# Patient Record
Sex: Female | Born: 1945 | Race: White | Hispanic: No | Marital: Married | Smoking: Never smoker
Health system: Southern US, Community
[De-identification: ages and names within clinical notes are randomized; demographics above are authoritative.]

## PROBLEM LIST (undated history)

## (undated) DIAGNOSIS — N289 Disorder of kidney and ureter, unspecified: Secondary | ICD-10-CM

## (undated) DIAGNOSIS — C4491 Basal cell carcinoma of skin, unspecified: Secondary | ICD-10-CM

## (undated) DIAGNOSIS — I10 Essential (primary) hypertension: Secondary | ICD-10-CM

## (undated) DIAGNOSIS — G8929 Other chronic pain: Secondary | ICD-10-CM

## (undated) DIAGNOSIS — R51 Headache: Secondary | ICD-10-CM

## (undated) DIAGNOSIS — M5136 Other intervertebral disc degeneration, lumbar region: Secondary | ICD-10-CM

## (undated) DIAGNOSIS — Z8719 Personal history of other diseases of the digestive system: Secondary | ICD-10-CM

## (undated) DIAGNOSIS — Z86010 Personal history of colonic polyps: Principal | ICD-10-CM

## (undated) DIAGNOSIS — C801 Malignant (primary) neoplasm, unspecified: Secondary | ICD-10-CM

## (undated) DIAGNOSIS — E785 Hyperlipidemia, unspecified: Secondary | ICD-10-CM

## (undated) DIAGNOSIS — K5790 Diverticulosis of intestine, part unspecified, without perforation or abscess without bleeding: Secondary | ICD-10-CM

## (undated) DIAGNOSIS — M51369 Other intervertebral disc degeneration, lumbar region without mention of lumbar back pain or lower extremity pain: Secondary | ICD-10-CM

## (undated) DIAGNOSIS — Z9889 Other specified postprocedural states: Secondary | ICD-10-CM

## (undated) DIAGNOSIS — K219 Gastro-esophageal reflux disease without esophagitis: Secondary | ICD-10-CM

## (undated) HISTORY — DX: Personal history of other diseases of the digestive system: Z87.19

## (undated) HISTORY — DX: Personal history of colonic polyps: Z86.010

## (undated) HISTORY — DX: Other intervertebral disc degeneration, lumbar region: M51.36

## (undated) HISTORY — PX: COLONOSCOPY: SHX174

## (undated) HISTORY — DX: Basal cell carcinoma of skin, unspecified: C44.91

## (undated) HISTORY — DX: Diverticulosis of intestine, part unspecified, without perforation or abscess without bleeding: K57.90

## (undated) HISTORY — DX: Other specified postprocedural states: Z98.890

## (undated) HISTORY — DX: Headache: R51

## (undated) HISTORY — DX: Other intervertebral disc degeneration, lumbar region without mention of lumbar back pain or lower extremity pain: M51.369

## (undated) HISTORY — DX: Hyperlipidemia, unspecified: E78.5

## (undated) HISTORY — DX: Gastro-esophageal reflux disease without esophagitis: K21.9

## (undated) HISTORY — DX: Malignant (primary) neoplasm, unspecified: C80.1

## (undated) HISTORY — PX: UPPER GASTROINTESTINAL ENDOSCOPY: SHX188

## (undated) HISTORY — PX: PORTACATH PLACEMENT: SHX2246

## (undated) HISTORY — DX: Other chronic pain: G89.29

---

## 1998-05-19 ENCOUNTER — Encounter: Payer: Self-pay | Admitting: Emergency Medicine

## 1998-05-19 ENCOUNTER — Emergency Department (HOSPITAL_COMMUNITY): Admission: EM | Admit: 1998-05-19 | Discharge: 1998-05-19 | Payer: Self-pay | Admitting: Emergency Medicine

## 1998-09-19 ENCOUNTER — Ambulatory Visit (HOSPITAL_COMMUNITY): Admission: RE | Admit: 1998-09-19 | Discharge: 1998-09-19 | Payer: Self-pay | Admitting: Family Medicine

## 1998-09-19 ENCOUNTER — Encounter: Payer: Self-pay | Admitting: Family Medicine

## 2000-05-28 ENCOUNTER — Encounter: Payer: Self-pay | Admitting: Emergency Medicine

## 2000-05-28 ENCOUNTER — Emergency Department (HOSPITAL_COMMUNITY): Admission: EM | Admit: 2000-05-28 | Discharge: 2000-05-28 | Payer: Self-pay | Admitting: Emergency Medicine

## 2000-07-20 ENCOUNTER — Emergency Department (HOSPITAL_COMMUNITY): Admission: EM | Admit: 2000-07-20 | Discharge: 2000-07-20 | Payer: Self-pay | Admitting: Emergency Medicine

## 2001-05-23 ENCOUNTER — Encounter: Payer: Self-pay | Admitting: Vascular Surgery

## 2001-05-28 ENCOUNTER — Ambulatory Visit (HOSPITAL_COMMUNITY): Admission: RE | Admit: 2001-05-28 | Discharge: 2001-05-28 | Payer: Self-pay | Admitting: Vascular Surgery

## 2001-05-28 ENCOUNTER — Ambulatory Visit (HOSPITAL_COMMUNITY): Admission: RE | Admit: 2001-05-28 | Discharge: 2001-05-28 | Payer: Self-pay | Admitting: *Deleted

## 2002-04-06 ENCOUNTER — Emergency Department (HOSPITAL_COMMUNITY): Admission: EM | Admit: 2002-04-06 | Discharge: 2002-04-06 | Payer: Self-pay

## 2002-05-23 ENCOUNTER — Inpatient Hospital Stay (HOSPITAL_COMMUNITY): Admission: EM | Admit: 2002-05-23 | Discharge: 2002-05-24 | Payer: Self-pay | Admitting: Emergency Medicine

## 2002-07-01 ENCOUNTER — Emergency Department (HOSPITAL_COMMUNITY): Admission: EM | Admit: 2002-07-01 | Discharge: 2002-07-01 | Payer: Self-pay | Admitting: Emergency Medicine

## 2002-08-24 ENCOUNTER — Ambulatory Visit (HOSPITAL_COMMUNITY): Admission: RE | Admit: 2002-08-24 | Discharge: 2002-08-24 | Payer: Self-pay | Admitting: General Surgery

## 2003-03-11 ENCOUNTER — Encounter: Payer: Self-pay | Admitting: Emergency Medicine

## 2003-03-11 ENCOUNTER — Emergency Department (HOSPITAL_COMMUNITY): Admission: EM | Admit: 2003-03-11 | Discharge: 2003-03-12 | Payer: Self-pay | Admitting: Emergency Medicine

## 2003-11-05 ENCOUNTER — Ambulatory Visit (HOSPITAL_COMMUNITY): Admission: RE | Admit: 2003-11-05 | Discharge: 2003-11-05 | Payer: Self-pay | Admitting: General Surgery

## 2004-08-02 ENCOUNTER — Emergency Department (HOSPITAL_COMMUNITY): Admission: EM | Admit: 2004-08-02 | Discharge: 2004-08-02 | Payer: Self-pay | Admitting: Emergency Medicine

## 2005-04-06 ENCOUNTER — Ambulatory Visit: Payer: Self-pay | Admitting: Internal Medicine

## 2005-04-23 ENCOUNTER — Ambulatory Visit: Payer: Self-pay | Admitting: Internal Medicine

## 2005-08-05 ENCOUNTER — Encounter: Admission: RE | Admit: 2005-08-05 | Discharge: 2005-08-05 | Payer: Self-pay | Admitting: Neurology

## 2005-09-18 ENCOUNTER — Ambulatory Visit: Payer: Self-pay | Admitting: Internal Medicine

## 2005-11-09 ENCOUNTER — Ambulatory Visit: Payer: Self-pay | Admitting: Internal Medicine

## 2006-02-19 ENCOUNTER — Ambulatory Visit: Payer: Self-pay | Admitting: Internal Medicine

## 2006-02-20 ENCOUNTER — Encounter (INDEPENDENT_AMBULATORY_CARE_PROVIDER_SITE_OTHER): Payer: Self-pay | Admitting: Specialist

## 2006-02-20 ENCOUNTER — Ambulatory Visit (HOSPITAL_COMMUNITY): Admission: RE | Admit: 2006-02-20 | Discharge: 2006-02-20 | Payer: Self-pay | Admitting: Internal Medicine

## 2006-04-02 ENCOUNTER — Ambulatory Visit: Payer: Self-pay | Admitting: Internal Medicine

## 2006-04-09 ENCOUNTER — Inpatient Hospital Stay (HOSPITAL_COMMUNITY): Admission: EM | Admit: 2006-04-09 | Discharge: 2006-04-20 | Payer: Self-pay | Admitting: Emergency Medicine

## 2006-04-11 ENCOUNTER — Encounter (INDEPENDENT_AMBULATORY_CARE_PROVIDER_SITE_OTHER): Payer: Self-pay | Admitting: *Deleted

## 2006-04-12 ENCOUNTER — Ambulatory Visit: Payer: Self-pay | Admitting: Gastroenterology

## 2006-05-03 ENCOUNTER — Encounter (HOSPITAL_COMMUNITY): Admission: RE | Admit: 2006-05-03 | Discharge: 2006-08-01 | Payer: Self-pay | Admitting: Nephrology

## 2006-05-14 ENCOUNTER — Ambulatory Visit: Payer: Self-pay | Admitting: Internal Medicine

## 2008-07-30 ENCOUNTER — Emergency Department (HOSPITAL_COMMUNITY): Admission: EM | Admit: 2008-07-30 | Discharge: 2008-07-31 | Payer: Self-pay | Admitting: Emergency Medicine

## 2008-12-09 ENCOUNTER — Emergency Department (HOSPITAL_COMMUNITY): Admission: EM | Admit: 2008-12-09 | Discharge: 2008-12-09 | Payer: Self-pay | Admitting: Emergency Medicine

## 2010-01-17 ENCOUNTER — Ambulatory Visit: Payer: Self-pay | Admitting: Family Medicine

## 2010-01-17 DIAGNOSIS — S99919A Unspecified injury of unspecified ankle, initial encounter: Secondary | ICD-10-CM

## 2010-01-17 DIAGNOSIS — I10 Essential (primary) hypertension: Secondary | ICD-10-CM | POA: Insufficient documentation

## 2010-01-17 DIAGNOSIS — S99929A Unspecified injury of unspecified foot, initial encounter: Secondary | ICD-10-CM | POA: Insufficient documentation

## 2010-01-17 DIAGNOSIS — H6500 Acute serous otitis media, unspecified ear: Secondary | ICD-10-CM

## 2010-01-17 DIAGNOSIS — M109 Gout, unspecified: Secondary | ICD-10-CM | POA: Insufficient documentation

## 2010-01-17 DIAGNOSIS — E785 Hyperlipidemia, unspecified: Secondary | ICD-10-CM | POA: Insufficient documentation

## 2010-01-17 DIAGNOSIS — H66019 Acute suppurative otitis media with spontaneous rupture of ear drum, unspecified ear: Secondary | ICD-10-CM | POA: Insufficient documentation

## 2010-01-17 DIAGNOSIS — S8990XA Unspecified injury of unspecified lower leg, initial encounter: Secondary | ICD-10-CM

## 2010-03-03 ENCOUNTER — Emergency Department (HOSPITAL_COMMUNITY): Admission: EM | Admit: 2010-03-03 | Discharge: 2010-03-04 | Payer: Self-pay | Admitting: Emergency Medicine

## 2010-03-04 ENCOUNTER — Ambulatory Visit (HOSPITAL_COMMUNITY): Admission: RE | Admit: 2010-03-04 | Discharge: 2010-03-04 | Payer: Self-pay | Admitting: Emergency Medicine

## 2010-07-21 ENCOUNTER — Encounter: Payer: Self-pay | Admitting: Family Medicine

## 2010-07-21 ENCOUNTER — Ambulatory Visit
Admission: RE | Admit: 2010-07-21 | Discharge: 2010-07-21 | Payer: Self-pay | Source: Home / Self Care | Attending: Family Medicine | Admitting: Family Medicine

## 2010-07-21 DIAGNOSIS — Z940 Kidney transplant status: Secondary | ICD-10-CM | POA: Insufficient documentation

## 2010-08-17 NOTE — Assessment & Plan Note (Signed)
Summary: SINUS INFECTION/EVM   Vital Signs:  Patient profile:   65 year old female Height:      64.5 inches Weight:      205 pounds O2 Sat:      97 % on Room air Temp:     97.2 degrees F oral Pulse rate:   112 / minute Pulse rhythm:   regular Resp:     18 per minute  Vitals Entered By: Levonne Spiller EMT-P (July 21, 2010 3:05 PM)  O2 Flow:  Room air CC: Bilateral face pain and headache Is Patient Diabetic? No Pain Assessment Patient in pain? yes     Location: head Intensity: 8 Type: aching Onset of pain  Gradual  Does patient need assistance? Functional Status Self care Ambulation Normal   Chief Complaint:  Bilateral face pain and headache.  History of Present Illness: The patient is presenting today and reporting that for the last 2-3 weeks she has been having worsening sinus pain and pressure and postnasal drainage.  She is having maxillary sinus pain.  She reports that she has been feeling lightheaded at times and has been having migraine headaches.  She has been sneezing and coughing.  She says that she has left ear pain and pressure as well.  She says that her nose has been running and producing a clear nasal discharge.  She reports that she has not fallen down.  She has been taking her BP medications daily.  She reports that her BP meds had been changed recently in November 2011 by her nephrologist (pt says she has had a kidney transplant). She was taken off Amlodipine 10 mg and placed on lisinopril/HCTZ 10/12.5. She is scheduled to see her nephrologist next week in 5 days.  She denies CP, SOB, falls, visual changes.  She says that the edema in the extremities has improved overall since getting off the amlodipine.    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 Kidney Transplant 07/27/2003  Family History: No history of kidney disease reported  Social History: No Tobacco, No ETOH, no Rec Drugs Pt doesn't have an automobile.    Review of Systems General:  Denies chills, fatigue, fever, loss of appetite, malaise, sleep disorder, sweats, weakness, and weight loss. Eyes:  Denies blurring, discharge, double vision, eye irritation, eye pain, halos, itching, light sensitivity, red eye, vision loss-1 eye, and vision loss-both eyes. ENT:  Complains of decreased hearing, earache, nasal congestion, sinus pressure, and sore throat. CV:  Denies bluish discoloration of lips or nails, chest pain or discomfort, difficulty breathing at night, difficulty breathing while lying down, fainting, fatigue, leg cramps with exertion, lightheadness, near fainting, palpitations, shortness of breath with exertion, swelling of feet, swelling of hands, and weight gain. Resp:  Denies chest discomfort, chest pain with inspiration, cough, coughing up blood, excessive snoring, hypersomnolence, morning headaches, pleuritic, shortness of breath, sputum productive, and wheezing. GI:  Denies abdominal pain, bloody stools, change in bowel habits, constipation, dark tarry stools, diarrhea, excessive appetite, gas, hemorrhoids, indigestion, loss of appetite, nausea, vomiting, vomiting blood, and yellowish skin color. GU:  Denies abnormal vaginal bleeding, decreased libido, discharge, dysuria, genital sores, hematuria, incontinence, nocturia, urinary frequency, and urinary hesitancy. MS:  Denies joint pain, joint redness, joint swelling, loss of strength, low back pain, mid back pain,  muscle aches, muscle , cramps, muscle weakness, stiffness, and thoracic pain. Derm:  Denies changes in color of skin, changes in nail beds, dryness, excessive perspiration, flushing, hair loss, insect bite(s), itching, lesion(s), poor wound healing, and rash. Neuro:   Complains of headaches; denies brief paralysis, difficulty with concentration, disturbances in coordination, falling down, inability to speak, memory loss, numbness, poor balance, seizures, sensation of room spinning, tingling, tremors, visual disturbances, and weakness. Psych:  Denies alternate hallucination ( auditory/visual), anxiety, depression, easily angered, easily tearful, irritability, mental problems, panic attacks, sense of great danger, suicidal thoughts/plans, thoughts of violence, unusual visions or sounds, and thoughts /plans of harming others; Problems Sleeping. Endo:  Denies cold intolerance, excessive hunger, excessive thirst, excessive urination, heat intolerance, polyuria, and weight change. Heme:  Complains of abnormal bruising; denies bleeding, enlarge lymph nodes, fevers, pallor, and skin discoloration. Allergy:  Denies hives or rash, itching eyes, persistent infections, seasonal allergies, and sneezing.  Physical Exam  General:  Well-developed,well-nourished,in no acute distress; alert,appropriate and cooperative throughout examination Head:  Normocephalic and atraumatic without obvious abnormalities. No apparent alopecia or balding. Eyes:  No corneal or conjunctival inflammation noted. EOMI. Perrla. Funduscopic exam benign, without hemorrhages, exudates or papilledema. Vision grossly normal. Ears:  External ear exam shows no significant lesions or deformities.  Otoscopic examination reveals clear canals, tympanic membranes are intact bilaterally without bulging, retraction, inflammation or discharge. Hearing is grossly normal bilaterally. Nose:  bilateral swollen nasal turbinates with yellow crusting seen in the right nares.  partially deviated septum seen. No external edema seen.  Mouth:  Edentulous, dry mucous membranes, tongue dry.  Neck:  No deformities, masses, or tenderness noted. Lungs:  Normal respiratory effort, chest expands symmetrically. Lungs are clear to  auscultation, no crackles or wheezes. Heart:  Normal rate and regular rhythm. S1 and S2 normal without gallop, murmur, click, rub or other extra sounds. Pulses:  weak, thready pulses noted, therefore EKG was ordered...see in chart.  Extremities:   left pretibial edema and right pretibial edema.   Skin:  thin skin noted, some minor ecchymoses seen Psych:  Cognition and judgment appear intact. Alert and cooperative with normal attention span and concentration. No apparent delusions, illusions, hallucinations   Impression & Recommendations:  Problem # 1:  HYPOTENSION (ICD-458.9) Assessment New  The patient was given some oral fluids in the office and instructed to stop taking the blood pressure medications until she can be evaluated and reassessed by her nephrologist.  The patient was advised to get a home BP monitor and monitor her BP at least 2-3 times per day. I also told her daughter.  I reviewed FALL precautions with the patient and made sure that she understood that she was at high risk for falling.  I asked her to drink extra clear fluids as well. The patient verbalized clear understanding.   If the patient starts to get mental status changes, becomes unsteady on her feet or any acute changes, I advised her to go to the ER.  The patient verbalized clear understanding.    Problem # 2:  ACUTE SINUSITIS, UNSPECIFIED (ICD-461.9)  Her updated medication list for this problem includes:    Amoxicillin 500 Mg Caps (Amoxicillin) .Marland Kitchen... Take 1 by mouth two times a day until completed    Fluticasone Propionate 50 Mcg/act Susp (Fluticasone propionate) .Marland Kitchen... 2 sprays per nostril once daily  Problem # 3:  ALLERGIC RHINITIS CAUSE UNSPECIFIED (ICD-477.9)  Her updated medication list for this problem includes:    Fluticasone Propionate 50 Mcg/act  Susp (Fluticasone propionate) .Marland Kitchen... 2 sprays per nostril once daily    Loratadine 10 Mg Tabs (Loratadine) .Marland Kitchen... Take 1 by mouth daily as needed for  allergies  Problem # 4:  KIDNEY TRANSPLANTATION, HX OF (ICD-V42.0) The patient was advised to make sure and see her nephrologist in 5 days as scheduled.  The patient verbalized clear understanding.    Complete Medication List: 1)  Magnesium Oxide 400 Mg Tabs (Magnesium oxide) 2)  Phospha 250 Neutral [phone] Mg Tabs (K phos mono-sod phos di & mono) 3)  Amlodipine Besylate 10 Mg Tabs (Amlodipine besylate) 4)  Nexium 40 Mg Pack (Esomeprazole magnesium) 5)  Prograf 0.5 Mg Caps (Tacrolimus) 6)  Lipitor 40 Mg Tabs (Atorvastatin calcium) 7)  Lexapro 20 Mg Tabs (Escitalopram oxalate) 8)  Myfortic 360 Mg Tbec (Mycophenolate sodium) 9)  Sumatriptan Succinate 100 Mg Tabs (Sumatriptan succinate) 10)  Cortisporin 3.5-10000-1 Soln (Neomycin-polymyxin-hc) .Marland Kitchen.. 1 drop right ear 4x/day 11)  Tylenol With Codeine #3 300-30 Mg Tabs (Acetaminophen-codeine) .Marland Kitchen.. 1 by mouth q6-8 hours as needed pain 12)  R Knee Brace  .... Dx: pain and inflammation r knee 13)  Lisinopril-hctz 10mg - 12.5mg   .... 1 per day 14)  Amoxicillin 500 Mg Caps (Amoxicillin) .... Take 1 by mouth two times a day until completed 15)  Fluticasone Propionate 50 Mcg/act Susp (Fluticasone propionate) .... 2 sprays per nostril once daily 16)  Loratadine 10 Mg Tabs (Loratadine) .... Take 1 by mouth daily as needed for allergies 17)  Abreva 10 % Crea (Docosanol) .... Apply 5x/day to cold sores until healed:  max use x 10 days  Patient Instructions: 1)  Stop Taking Your blood pressure medications Now until you can be seen by your kidney specialist in 5 days.  Your blood pressure is too low. 2)  Get a home BP monitor and start checking your blood pressure 2 to 3 times per day.  3)  Check your Blood Pressure regularly. If it is below: 90/70 you should make an appointment. 4)  Please be sure to see your nephrologist in 5 days as scheduled. 5)  Oral Rehydration Solution: drink 1/2 ounce every 15 minutes. If tolerated afert 1 hour, drink 1 ounce  every 15 minutes. As you can tolerate, keep adding 1/2 ounce every 15 minutes, up to a total of 2-4 ounces. Contact the office if unable to tolerate oral solution, if you keep vomiting, or you continue to have signs of dehydration. 6)  Take your antibiotic as prescribed until ALL of it is gone, but stop if you develop a rash or swelling and contact our office as soon as possible. 7)  Acute sinusitis symptoms for less than 10 days are not helped by antibiotics.Use warm moist compresses, and over the counter decongestants ( only as directed). Call if no improvement in 5-7 days, sooner if increasing pain, fever, or new symptoms. 8)  The patient was informed that there is no on-call provider or services available at this clinic during off-hours (when the clinic is closed).  If the patient developed a problem or concern that required immediate attention, the patient was advised to go the the nearest available urgent care or emergency department for medical care.  The patient verbalized understanding.    9)  Take extra special precautions to avoid falling down.  You are at HIGH RISK for falling down.  Please be careful and make sure you are holding on to something at all times when you are getting up and moving around.  Prescriptions: ABREVA  10 % CREA (DOCOSANOL) apply 5x/day to cold sores until healed:  Max use x 10 days  #1 x 0   Entered and Authorized by:   Standley Dakins MD   Signed by:   Standley Dakins MD on 07/21/2010   Method used:   Electronically to        [facility] [id number] (retail)       [address]       [place], [place]  [address]       Ph: [phone] or [phone]       Fax: [phone]   RxID:   [id number] LORATADINE 10 MG TABS (LORATADINE) take 1 by mouth daily as needed for allergies  #20 x 0   Entered and Authorized by:   Standley Dakins MD   Signed by:   Standley Dakins MD on 07/21/2010   Method used:   Elect[facility]ically to        [facility]  [id number] (retail)       [address]       [place], [place]  [address]       Ph: [phone] or [phone]       Fax: [phone]   RxID:   [id number] FLUTICASONE PROPIONATE 50 MCG/ACT SUSP (FLUTICASONE PROPIONATE) 2 sprays per nostril once daily  #1 x 0   Entered and Authorized by:   Standley Dakins MD   Signed by:   Standley Dakins MD on 07/21/2010   Method used:   [facility]        [facility] [id number] ([facility]       [address]       [place], [place]  [address]       Ph: [phone] or [phone]       Fax: [phone]   RxID:   [id number] AMOXICILLIN 500 MG CAPS (AMOXICILLIN) take 1 by mouth two times a day until completed  #20 x 0   Entered and Authorized by:   Standley Dakins MD   Signed by:   Standley Dakins MD on 07/21/2010   Method used:   [facility]        [facility] [id number] ([facility]ail[facility]       [address]       [place], [place]  [address]       Ph: [phone] or [phone]       Fax: [phone]   RxID:   [id number]     [facility] ECG  ID: [id number] Patient: Erica Woodward, Erica Woodward DOB: 02/14/1946 Age: 65 Years Old Sex: Female Race: White Physician: Laural Benes Height: 64.5 Weight: 205 Indication for test: hypotension, thready pulse Status: Confirmed Past Medical History:  Migraines Gout Hyperlipidemia Hypertension   Interpretation:  sinus tachycardia Nonspecific ST-T wave abnormalites

## 2010-08-17 NOTE — Assessment & Plan Note (Signed)
Summary: EAR DRAINAGE/JBB   Vital Signs:  Patient Profile:   65 Years Old Female CC:      Bilateral Ear Ache. / rwt Height:     64.5 inches Weight:      192 pounds BMI:     32.57 O2 Sat:      97 % O2 treatment:    Room Air Temp:     97.0 degrees F oral Pulse rate:   94 / minute Pulse rhythm:   regular Resp:     20 per minute BP sitting:   126 / 72  (left arm)  Pt. in pain?   yes    Location:   head    Intensity:   1    Type:       aching  Vitals Entered By: Levonne Spiller EMT-P (January 17, 2010 11:43 AM)              Is Patient Diabetic? No      Current Allergies: No known allergies History of Present Illness History from: patient Reason for visit: see chief complaint Chief Complaint: Bilateral Ear Ache. / rwt History of Present Illness: Both ears have been bothering her for about 1 month. Bilateral hearing loss worse on the right. History of frequent ear infections as a child and as an adult by her report. No f/c. + drainage - occ bloody and sharp pains in right ear. + congestion, runny nose, clear and no dizziness.   Fell on Sunday and has pain in her right knee. Worse with walking and weight bearing. Started to hurt in the back of the knee yesterday.  Also with swelling.   Dr. Lowell Guitar - Nephrologist - usually about every 3 months. S/p kidney transplant 07/27/03. She does not have a PCP.       Past History:  Past Medical History: Migraines Gout Hyperlipidemia Hypertension  Past Surgical History: Kidney Transplant 07/27/2003 Physical Exam General appearance: well developed, well nourished, no acute distress Ears: normal left TM - R TM with erythema and small upper perforation Neck: neck supple,  trachea midline, no masses, nontender lymphadenopathy Chest/Lungs: no rales, wheezes, or rhonchi bilateral, breath sounds equal without effort Heart: regular rate and  rhythm, no murmur Extremities: Bilateral knee crepitus, R knee with ecchymosis laterally, tender at  tibial plateau. + fullness behind the knee and subpatella bursa.  Skin: no obvious rashes or lesions MSE: oriented to time, place, and person Assessment New Problems: OTITIS MEDIA, ACUTE WITH RUPTURE OF TYMPANIC MEMBRANE (ICD-382.01) HYPERTENSION (ICD-401.9) HYPERLIPIDEMIA (ICD-272.4) GOUT (ICD-274.9) KNEE INJURY, RIGHT (ICD-959.7) ACUTE SEROUS OTITIS MEDIA (ICD-381.01)   Plan New Medications/Changes: R KNEE BRACE Dx: pain and inflammation R knee  #1 x 0, 01/17/2010, Tacey Ruiz MD TYLENOL WITH CODEINE #3 300-30 MG TABS (ACETAMINOPHEN-CODEINE) 1 by mouth Q6-8 hours as needed pain  #20 x 0, 01/17/2010, Tacey Ruiz MD CORTISPORIN 3.5-10000-1 SOLN [facility]) 1 drop right ear 4x/day  #1 btl x 0, 01/17/2010, Tacey Ruiz MD  New Orders: New Patient Level III [id number] Planning Comments:   She was encouraged to keep the ear dry and to use the medication as prescribed. She was given the number of an audiologist for further evaluation.  She was told to elevate the right knee and wear the ace bandage as directed. If she continues to have no improvement in pain/swelling she should call/return for possible xrays. We stayed away from NSAIDs which have better anti-inflammatory properties given her h/o kidney transplantation. If no improvement will discuss this  with her nephrologist. this was discussed with patient and she agreed.   The patient and/or caregiver has been counseled thoroughly with regard to medications prescribed including dosage, schedule, interactions, rationale for use, and possible side effects and they verbalize understanding.  Diagnoses and expected course of recovery discussed and will return if not improved as expected or if the condition worsens. Patient and/or caregiver verbalized understanding.  Prescriptions: R KNEE BRACE Dx: pain and inflammation R knee  #1 x 0   Entered and Authorized by:   Tacey Ruiz MD   Signed by:   Tacey Ruiz MD on 01/17/2010   Method used:    Print then Give to Patient   RxID:   [id number] TYLENOL WITH CODEINE #3 300-30 MG TABS (ACETAMINOPHEN-CODEINE) 1 by mouth Q6-8 hours as needed pain  #20 x 0   Entered and Authorized by:   Tacey Ruiz MD   Signed by:   Tacey Ruiz MD on 01/17/2010   Method used:   Print then Give to Patient   RxID:   [id number] CORTISPORIN 3.5-10000-1 SOLN (NEOMYCIN-POLYMYXIN-HC) 1 drop right ear 4x/day  #1 btl x 0   Entered and Authorized by:   Tacey Ruiz MD   Signed by:   Tacey Ruiz MD on 01/17/2010   Method used:   Electronically to        [facility]  [address] (retail)       [address], [address]       [place], [place]  [address]       Ph: [phone]       Fax: [phone]   RxID:   [id number]   Patient Instructions: 1)  You may move around but avoid painful motions. Apply ice to sore area for 20 minutes 3-4 times a day for 2-3 days. 2)  Wear ace bandage to help with inflammation. 3)  Please make an appointment with an audiologist (ear doctor) for your ear pain.   Orders Added: 1)  New Patient Level III [99203]  The patient has been informed that he/she needs to obtain a primary care physician for completeness and better continuity of care. We are a conveinent care facility and not a primary care office.  The risks, benefits and possible side effects of the treatments and tests were explained clearly to the patient and the patient verbalized understanding.  The patient was informed that there is no on-call provider or services available at this clinic during off-hours (when the clinic is closed).  If the patient developed a problem or concern that required immediate attention, the patient was advised to go the the nearest available urgent care or emergency department for medical care.  The patient verbalized understanding.

## 2010-09-29 LAB — PROTIME-INR
INR: 0.91 (ref 0.00–1.49)
Prothrombin Time: 12.5 seconds (ref 11.6–15.2)

## 2010-09-29 LAB — DIFFERENTIAL
Basophils Absolute: 0 10*3/uL (ref 0.0–0.1)
Eosinophils Absolute: 0.1 10*3/uL (ref 0.0–0.7)
Monocytes Absolute: 0.6 10*3/uL (ref 0.1–1.0)
Neutrophils Relative %: 52 % (ref 43–77)

## 2010-09-29 LAB — BASIC METABOLIC PANEL
CO2: 25 mEq/L (ref 19–32)
Chloride: 108 mEq/L (ref 96–112)
Potassium: 3.6 mEq/L (ref 3.5–5.1)
Sodium: 138 mEq/L (ref 135–145)

## 2010-09-29 LAB — CBC
Hemoglobin: 12.1 g/dL (ref 12.0–15.0)
MCH: 30 pg (ref 26.0–34.0)
RBC: 4.04 MIL/uL (ref 3.87–5.11)
RDW: 14.1 % (ref 11.5–15.5)

## 2010-09-29 LAB — APTT: aPTT: 28 seconds (ref 24–37)

## 2010-10-24 LAB — DIFFERENTIAL
Basophils Absolute: 0 10*3/uL (ref 0.0–0.1)
Eosinophils Relative: 1 % (ref 0–5)
Lymphs Abs: 1.9 10*3/uL (ref 0.7–4.0)
Monocytes Relative: 10 % (ref 3–12)
Neutrophils Relative %: 56 % (ref 43–77)

## 2010-10-24 LAB — URINALYSIS, ROUTINE W REFLEX MICROSCOPIC
Glucose, UA: NEGATIVE mg/dL
Hgb urine dipstick: NEGATIVE
Specific Gravity, Urine: 1.024 (ref 1.005–1.030)

## 2010-10-24 LAB — URINE MICROSCOPIC-ADD ON

## 2010-10-24 LAB — BASIC METABOLIC PANEL
BUN: 13 mg/dL (ref 6–23)
CO2: 24 mEq/L (ref 19–32)
Chloride: 109 mEq/L (ref 96–112)
Potassium: 3.5 mEq/L (ref 3.5–5.1)

## 2010-10-24 LAB — CBC
Hemoglobin: 13.1 g/dL (ref 12.0–15.0)
MCHC: 33.8 g/dL (ref 30.0–36.0)
Platelets: 220 10*3/uL (ref 150–400)

## 2010-10-24 LAB — URINE CULTURE

## 2010-10-30 LAB — DIFFERENTIAL
Basophils Absolute: 0 10*3/uL (ref 0.0–0.1)
Basophils Relative: 0 % (ref 0–1)
Eosinophils Absolute: 0 10*3/uL (ref 0.0–0.7)
Eosinophils Relative: 0 % (ref 0–5)
Lymphocytes Relative: 18 % (ref 12–46)
Lymphs Abs: 1.5 K/uL (ref 0.7–4.0)
Monocytes Absolute: 0.4 K/uL (ref 0.1–1.0)
Monocytes Relative: 5 % (ref 3–12)
Neutro Abs: 6.4 10*3/uL (ref 1.7–7.7)
Neutrophils Relative %: 77 % (ref 43–77)

## 2010-10-30 LAB — BASIC METABOLIC PANEL
BUN: 8 mg/dL (ref 6–23)
CO2: 24 mEq/L (ref 19–32)
Calcium: 8.6 mg/dL (ref 8.4–10.5)
Chloride: 107 mEq/L (ref 96–112)
Creatinine, Ser: 0.82 mg/dL (ref 0.4–1.2)

## 2010-10-30 LAB — CBC
HCT: 38.2 % (ref 36.0–46.0)
Hemoglobin: 12.5 g/dL (ref 12.0–15.0)
MCHC: 32.6 g/dL (ref 30.0–36.0)
MCV: 92.9 fL (ref 78.0–100.0)
Platelets: 225 10*3/uL (ref 150–400)
RBC: 4.12 MIL/uL (ref 3.87–5.11)
RDW: 13.3 % (ref 11.5–15.5)
WBC: 8.3 K/uL (ref 4.0–10.5)

## 2010-10-30 LAB — URINALYSIS, ROUTINE W REFLEX MICROSCOPIC
Bilirubin Urine: NEGATIVE
Glucose, UA: NEGATIVE mg/dL
Hgb urine dipstick: NEGATIVE
Ketones, ur: NEGATIVE mg/dL
Nitrite: NEGATIVE
Protein, ur: NEGATIVE mg/dL
Specific Gravity, Urine: 1.024 (ref 1.005–1.030)
Urobilinogen, UA: 1 mg/dL (ref 0.0–1.0)
pH: 7 (ref 5.0–8.0)

## 2010-10-30 LAB — URINE MICROSCOPIC-ADD ON

## 2010-10-30 LAB — BASIC METABOLIC PANEL WITH GFR
GFR calc Af Amer: >60 mL/min (ref >60)
GFR calc non Af Amer: >60 mL/min (ref >60)
Glucose, Bld: 110 mg/dL — ABNORMAL HIGH (ref 70–99)
Potassium: 4 meq/L (ref 3.5–5.1)
Sodium: 139 meq/L (ref 135–145)

## 2010-12-01 NOTE — H&P (Signed)
NAMETALER, KUSHNER              ACCOUNT NO.:  [id number]   MEDICAL RECORD NO.:  [id number]          PATIENT TYPE:  INP   LOCATION:  5730                         FACILITY:  [facility]   PHYSICIAN:  Malcolm T. Russella Dar, MD, FACGDATE OF BIRTH:  1945-12-21   DATE OF ADMISSION:  04/09/2006  DATE OF DISCHARGE:                                HISTORY & PHYSICAL   RENAL DOCTOR:  Dr. Casimiro Needle.   CHIEF COMPLAINT:  Chronic diarrhea and new abdominal discomfort, weakness.   HISTORY OF PRESENT ILLNESS:  Mrs. Pieczynski is a 65 year old white female.  She is status post renal transplant a couple of years ago.  She has been  having diarrhea for about 4 months; this is watery and not bloody.  She  underwent a colonoscopy as well as an endoscopy on August8, the day after  she was seen in the office by Dr. Leone Payor.  The colonoscopy was essentially  unremarkable and random biopsies of the colon revealed normal pathology with  no evidence for any microscopic or collagenous colitis or dysplasia.  On the  EGD, she did have some gastritis and there were a few atypical cells, but a  reading of this by pathologist at [facility] as well as Dr.  Luisa Hart here in [place] thought that it was chronic gastritis.  Dr.  Leone Payor had planned to repeat the EGD in a few months in order to recheck on  the gastritis after prolonged treatment with proton pump inhibitors and to  make sure that there was no lymphoma or any type of chronic infection.  On  the return office visit on September18 with Dr. Leone Payor, the diarrhea had  improved with the use of Imodium.  However, for the last several days, she  says that she really has not had any improvement in her diarrhea; she had  maybe 10 watery stools yesterday without evidence of blood.  Stools are  anywhere from a small amount to a moderate amount.  She had several episodes  overnight and has had 2 today.  Over the last couple of days, she has had  pain in her  lower abdomen which is not severe, but is more of a discomfort  and cramping.  She continues to feel weak and fatigued very easily.  She has  not had any syncope.  She called the office today complaining of these  symptoms and was advised to come to the emergency room.  In the emergency  room, her blood pressure is low and we are going to be checking some  orthostatic blood pressures, but the plan is to admit her for stool studies  and further evaluation, as well as symptomatic treatment of her symptoms.  She does say that she has lost about 30 pounds over the last several months.  Any time she eats, the food runs right through her.  She is somewhat  nauseated, but does not vomit.  She denies the use of any over-the-counter  medications.  She does take magnesium oxide daily and trialed not using this  for several days and it made no difference in the  frequency of or  consistency of her stools.   ALLERGIES:  None known.   CURRENT MEDICATIONS:  1. Imitrex 100 mg as needed for headaches.  She took that last week.  2. K-Phos 250 mg two p.o. twice daily.  3. CellCept 250 mg four pills twice a day; that is eight total pills per      day.  4. Fexofenadine 180 mg once daily.  5. Prograf 1 mg twice daily.  6. Magnesium oxide 450 mg two pills twice daily; that is four total.  7. Lexapro 20 mg daily.  8. Oxycodone p.r.n., but she has not been using this.  9. Lipitor 40 mg once daily.  10.Benicar 20 mg once daily.  11.AcipHex 20 mg once daily.  12.Aspirin 81 mg once daily.   PAST MEDICAL HISTORY:  1. End-stage renal disease, status post live donor transplant in      January2005 at [facility].  The patient's youngest daughter was the      kidney donor.  2. History of dysphagia, status post dilation of a benign esophageal      stricture in 2006.  3. Hypertension.  4. Dyslipidemia.  5. Allergic sinusitis.  6. Chronic headaches.  7. Status post tubal ligation.  8. Status post placement of  a peritoneal dialysis catheter in 2004 and it      was removed in April2005.  It was never used; the patient never did      have any dialysis, either hemodialysis or peritoneal dialysis.  She was      fortunate in getting the kidney transplant before needing dialysis.   SOCIAL HISTORY:  She is divorced.  She has a English as a second language teacher; however,  functionally, she is illiterate.  Though she can read, she cannot write a  check; she is unable to really do any figures or keep track of financial  things.  She does not smoke.  She does not drink alcohol.  She lives with  her daughter in [place].   FAMILY HISTORY:  Arrhythmias, lung cancer in a siblings who smoked, epilepsy  in a sibling, possible brain tumor in a sister; she underwent some sort of  brain surgery.  Her mother died in her 44s of pneumonia.   REVIEW OF SYSTEMS:  She denies difficulty urinating or change in the  character of her urine.  There is no blood and urine.  No palpitations.  No  chest pain.  No shortness of breath.  No cough.  Does have headaches with  some frequency.  Not using any over-the-counter medications.  Denies rash.  Denies extremity edema.  Does have weakness and limited exercise ability and  this compares with someone who was able to use the treadmill for walking  exercise several times a week before the onset of these GI symptoms weakened  her about 4 months ago.  The patient has been having periodic blisters on  her lips, but not within the mouth itself.  She also denied dysphagia or  odynophagia.   LABORATORIES:  Some are still pending, but so far the hemoglobin is 11,  hematocrit is 33.4, white blood cell count 3.3, platelets 248,000 and MCV is  93.1.  Urinalysis shows some small amount of leukocyte esterase, no  nitrites, 3-6 white blood cells and many epithelial cells.  Chemistries are  pending at this time.  PHYSICAL EXAM:  GENERAL:  The patient is a pale, unwell-looking elderly  white female.   She looks old for her age.  VITAL  SIGNS:  Blood pressure is 89/62, pulse is 87, respirations are 16 and  temperature is 98.3.  Weight has not yet been obtained.  Orthostatic blood  pressure and pulse are pending.  HEENT:  There is some pallor.  Extraocular movements are intact.  There is  no icterus.  Oropharynx:  The mucosa is moist and clear.  She does have some  blisters on her lips.  NECK:  There are no masses, no JVD.  CHEST:  Clear to auscultation and percussion bilaterally.  There is no  cough.  There is no dyspnea with speech or at rest.  COR:  There is regular rate and rhythm.  No murmurs, rubs or gallops.  Abdomen is soft, nontender, nondistended with active bowel sounds.  There is  no hepatosplenomegaly.  Mass palpated in the right lower quadrant is the  transplanted kidney.  No bruits audible.  RECTAL, GU AND BREASTS:  Exams were not done.  NEUROLOGIC:  She is alert and oriented x3.  There is no tremor.  She is  wearing eyeglasses.  PSYCHIATRIC:  The patient's affect is a bit blunt, as she is tired and looks  moderately ill, but she is appropriate.   ASSESSMENT:  1. Chronic diarrhea with weight loss.  Question as to whether this is      ongoing irritable bowel syndrome.  Question whether she has medication-      related diarrhea.  On an egd and colonoscopy about 6 weeks ago, there      was no evidence for celiac disease, colitis, both grossly or      microscopically.  She did have some gastritis and this is being covered      with proton pump inhibitor therapy.  2. Hypotension.  Probably, the patient is dehydrated.  3. Status post renal transplant, on chronic immunosuppressives.  4. Gastroesophageal reflux disease with history of the esophageal      stricture.  She is not having any dysphagia currently.   PLAN:  The patient is being admitted.  We will pursue stool studies.  We  will hydrate with IV fluids.  I have spoken to Dr Arrie Aran and he will see  the patient  possibly as soon as this evening, but renal consult can wait  until tomorrow.  We will continue all of her outpatient medications and add  empiric Flagyl to the administered medications.  We will check the BMET  tomorrow to see what happens to her chemistries following hydration.     ______________________________  Jennye Moccasin, PA-C      Venita Lick. Russella Dar, MD, [facility]  Electronically Signed    SG/MEDQ  D:  04/09/2006  T:  04/10/2006  Job:  [id number]

## 2010-12-01 NOTE — Op Note (Signed)
NAME:  Erica Woodward, Erica Woodward                        ACCOUNT NO.:  [id number]   MEDICAL RECORD NO.:  [id number]                   PATIENT TYPE:  OIB   LOCATION:  2899                                 FACILITY:  [facility]   PHYSICIAN:  Anselm Pancoast. Zachery Dakins, M.D.          DATE OF BIRTH:  Nov 20, 1945   DATE OF PROCEDURE:  11/05/2003  DATE OF DISCHARGE:                                 OPERATIVE REPORT   PREOPERATIVE DIAGNOSES:  1. Non-used Moncrete-Poplivich CAPD catheter.  2. History of recent kidney transplant.   POSTOPERATIVE DIAGNOSES:  1. Non-used Moncrete-Poplivich CAPD catheter.  2. History of recent kidney transplant.   PROCEDURE:  Removal of Moncrete-Poplivich catheter.   ANESTHESIA:  General.   SURGEON:  Anselm Pancoast. Zachery Dakins, M.D.   ASSISTANT:  Nurse.   INDICATIONS FOR PROCEDURE:  The patient is a 65 year old female who was  referred to me approximately two years ago for placement of a  CAPD  catheter.  Since she was failing, her creatinine was approximately 5.2 and  she was being followed by Dr. Lowell Guitar.  She, however, desired a transplant if  possible and was eligible to receive a kidney from her daughter and this was  done in January.  The kidney has functioned well and they have recommended  that she have the Moncrete-Poplivich catheter that was used removed since  she is not going to need it with the kidney.  Fortunately, everything has  been in the subcutaneous tissue and she is for the planned procedure  continuing on her chronic medications for the anti-rejection.   DESCRIPTION OF PROCEDURE:  The patient was given a gram of Kefzol.  She  elected general anesthesia and we induced general anesthesia via  endotracheal tube.  The abdomen was prepped with Betadine surgical scrub and  solution and then draped in a sterile manner.  A small incision was made  where the catheter had been inserted and down in the subcutaneous tissue I  identified the catheter where it was going into  the right rectus and then  the small area was opened about a half an inch so I could pull the internal  cuff up and then kind of free it from the rectus muscle predominantly with  cautery.  The peritoneum and posterior rectus fascia were identified and I  then placed a stitch within after carefully opening into the peritoneal  cavity.  The little bit of fluid was clear.  The catheter intraperitoneally  was removed and then I closed the posterior rectus fascia with a figure-of-  eight suture of 0 Vicryl.  Next, the anterior rectus fascia was closed with  two figure-of-eight sutures using 0 Vicryl.  This was just a little small  incision.  Next, using the catheter as actually a handle I dissected out the  large Moncrete-Poplivich cup from the subcutaneous tissue and this was then  freed and then I could remove the distal portion of the catheter that  had  been tunneled subcutaneously.  Numerous little bleeders had been coagulated.  I did suture one area with a figure-of-eight of 3-0 chromic and good  hemostasis was obtained.  I did put Marcaine with adrenaline in the fascia  and also the subcutaneous tissue to minimize postoperative pain. I then  closed the subcutaneous tissue with 3-0 chromic and  then the skin was closed with interrupted 5-0 nylon.  The patient tolerated  the procedure nicely and was taken to the recovery room in a stable  postoperative condition.  She will be released after a short stay in the  recovery room to continue on her regular medications.                                               Anselm Pancoast. Zachery Dakins, M.D.    WJW/MEDQ  D:  11/05/2003  T:  11/07/2003  Job:  [id number]

## 2010-12-01 NOTE — Consult Note (Signed)
Erica Woodward, Erica Woodward              ACCOUNT NO.:  [id number]   MEDICAL RECORD NO.:  [id number]          PATIENT TYPE:  INP   LOCATION:  5730                         FACILITY:  [facility]   PHYSICIAN:  Aram Beecham B. Eliott Nine, M.D.DATE OF BIRTH:  01-Mar-1946   DATE OF CONSULTATION:  DATE OF DISCHARGE:                                   CONSULTATION   REQUESTING PHYSICIAN:  Dr. Claudette Head.   REASON FOR CONSULTATION:  Patient with renal transplant, admitted with  diarrhea.   We are asked to follow this 65 year old female who is status post living-  related transplant from her daughter in 2005 who was admitted because of  diarrhea.  She is followed in our office by Dr. Lowell Guitar with a living-related  transplant on a steroid-free regimen, consisting of Cellcept and Prograf,  who has a baseline creatinine of 1.0 - last seen in July of 2007.   She has been seeing the [facility] because of issues of dysphagia (she  did have an EGD with a benign stricture and some gastritis), as well as  diarrhea with a colonoscopy which was negative back in August of 2007,  including random biopsies, which showed no pathology.   She has lost a significant amount of weight - she states 30 pounds.  From  our records, in January of 2007 she weighed 195 pounds, 179 in July of 2007  and 166 pounds at the time of this present admission.   At the time of admission, she was volume depleted with a blood pressure in  the 80s and has been receiving IV fluids at 125 to 150 an hour.  Her  creatinine was 1.2 on admission and has decreased to 1.0 today, which is her  baseline.   PAST MEDICAL HISTORY:  1. Status post living-related transplant in February of 2005 (had a      peritoneal dialysis catheter placed in anticipation of dialysis, which      was never required).  2. Hypertension - has been on and off medications in the last 6 months or      so, most recently on Benicar.  3. Dyslipidemia.  4. Obesity.  5.  Secondary hyperparathyroidism.  6. History of migraine headaches.  7. History of anxiety and depression.  8. Decreased hearing.  9. History of tubal ligation.  10.History of benign esophageal stricture, hiatal hernia and nonerosive      gastritis by EGD, August of 2007.   OUTPATIENT MEDICINES:  Include:  1. Imitrex.  2. K-Phos 250 two b.i.d.  3. CellCept 250 mg four b.i.d.  4. Prograf 1 mg b.i.d.  5. Lexapro 20 mg daily.  6. Fexofenadine 180 one daily.  7. Lipitor 40 mg a day.  8. Benicar 20 mg a day.  9. Aciphex 20 mg a day.  10.Aspirin 81 mg a day.  11.Lomotil as needed.   FAMILY HISTORY:  Positive for some cardiac problems, lungs cancer and  seizures.   SOCIAL HISTORY:  Patient is divorced and lives with her daughter in  [place].  She does not smoke or drink.   REVIEW OF SYSTEMS:  Of late, is positive for up to 10 loose stools a day,  associated with nausea and intermittent crampy abdominal discomfort.  She  does note that she only has the stools when she eats and when she does not  eat, she does not stool.  She has had easy fatigability, weakness,  lightheadedness.  There has been no change in urine output.  No abdominal  pain over the Allograft.  No fevers, chills or night sweats.  She has had  the documented weight loss as previously noted.   PHYSICAL EXAMINATION:  On physical exam today, her blood pressure was 122/77  (on presentation to the emergency room was as low as 82/63 with a heart rate  of 100).  Standing blood pressure is 110/74.  She is a pale white female in  no acute distress, but looked chronically ill and fatigued.  She is  edentulous, has no neck vein distention.  LUNGS:  Clear.  CARDIAC EXAM:  S1, S2, no S3.  Mildly tachy at 100.  ABDOMEN:  Obese.  Bowel sounds are present.  She has a scar in the right  lower quadrant from her renal Allograft and also scars from previous PD  catheter.  She has no abdominal tenderness.  EXTREMITIES:  Entirely  free of edema.   LABORATORY:  From today, sodium 142, potassium 3.7, chloride 113, CO2 25,  BUN 13 and creatinine 0.9 (16 and 1.2 on admission), calcium is 8.6, albumin  3.7.  Urinalysis:  Specific gravity 1.027, pH of 5.5 and, otherwise,  negative.  Stool culture is negative and stools are also negative for  Giardia and Cryptosporidia.  CBC shows a white count of 3300, hemoglobin  11.0 (13.3 in January of 2007, 10.8 in July of 2007), hematocrit 33.4,  platelets 248,000.   IMPRESSION:  A 65 year old white female status post living-related  transplant from her daughter in 2005 with normal Allograft function who  presents with:  1. Chronic diarrhea - this is associated with greater than a 30 pound      weight loss since January of this year and progressive anemia.  In this      immunocompromised patient, need to rule out opportunistic infections.      Plans for repeat endoscopy are noted, as well as plans for abdominal CT      scan with IV contrast.  Would avoid using IV contrast if possible, but      if absolutely necessary would make sure she is adequately hydrated and      limit the dose as much as possible.  2. Living-related donor transplant with normal Allograft function - she      was volume depleted and hypotensive on admission with a creatinine that      was up a bit from her baseline from 1.0 to 1.2, but is back at      baseline.  Would continue IV fluids and stop Benicar.  3. Hypertension - hypotensive on admission - stopped Benicar.  Would want      to hold this especially if she is to receive IV contrast.  4. Dyslipidemia.  5. Migraine headaches.  6. Anemia - check stools for occult blood (she is already getting the      endoscopic workup) and also look at iron studies B12 and folate.   Thanks for asking [place] to see her and we will follow along with you.           ______________________________  Duke Salvia. Eliott Nine, M.D.  CBD/MEDQ  D:  04/10/2006  T:  04/10/2006   Job:  [id number]

## 2010-12-01 NOTE — Op Note (Signed)
NAME:  Erica Woodward, Erica Woodward                        ACCOUNT NO.:  [id number]   MEDICAL RECORD NO.:  [id number]                   PATIENT TYPE:  OIB   LOCATION:  2550                                 FACILITY:  [facility]   PHYSICIAN:  Anselm Pancoast. Zachery Dakins, M.D.          DATE OF BIRTH:  [birth year]   DATE OF PROCEDURE:  08/24/2002  DATE OF DISCHARGE:                                 OPERATIVE REPORT   PREOPERATIVE DIAGNOSIS:  Impending renal failure secondary to hypertension.   OPERATION:  Placement of Moncrief-Popovich catheter.   SURGEON:  Anselm Pancoast. Zachery Dakins, M.D.   ANESTHESIA:  General anesthesia.   HISTORY:  The patient is a 65 year old female referred to me by Dr. Britt Bottom C.  Lowell Guitar, who has had chronic hypertension and a progressively elevated  creatinine.  She is a candidate for peritoneal dialysis and Dr. Lowell Guitar hopes  that Moncrief-Popovich catheter can be placed so that when she needs  peritoneal dialysis, this can be exteriorized to start peritoneal dialysis  at that time.  She is presently on chronic medication for the blood  pressure; she has also started sodium bicarbonate and _____ and Os-Cal.  Her  BUN and creatinine are slightly more elevated than when they were measured  in December with a creatinine of about 6 and a BUN of 63.  She has had no  previous abdominal surgery and appears to be a good candidate for a Moncrief-  Popovich catheter.  The patient preoperatively was given a gram of Kefzol  and taken back to the operative suite and induction of general anesthesia  with a LOA tube and the abdomen was prepped with Betadine surgical scrubbing  solution and draped in a sterile manner.  A small incision made vertically  to slightly below and to the right of the umbilicus with sharp dissection  through about 2 to 3 inches of adipose tissue.  The fascia was identified  and this was opened up, the rectus muscle split in direction of its fibers  and the posterior rectus fascia  in the peritoneum was picked up between two  hemostats.  A small opening was made and through the peritoneal cavity, two  pursestring sutures of 2-0 Vicryl and then the Moncrief-Popovich catheter  over a guidewire were inserted in the lower abdomen.  The pursestring  sutures were tied so that the internal cuff was flush and then I used a  little stitch actually into the cuff to kind of anchor it at this location.  The rectus muscle was then approximated with 3-0 chromic and then the  anterior rectus fascia was closed with interrupted sutures of 2-0 Prolene.  The catheter was tunneled subcutaneously and ultimately exited in the right  lower quadrant.  I brought the catheter up and then placed a 2-0 Prolene  around it to ease some identification when we externalized the catheter and  then tunneled across the sub-infraumbilical area and brought the catheter  out.  I then flushed it and it flushes easily.  I then filled it with  heparinized saline with about 6 mL and then the catheter was doubly tied  with a 2-0 silk to keep the heparin within the catheter for some time.  The  catheter was then placed in the subcutaneous tissue.  The subcutaneous wound  was closed with 3-0 chromic and then the skin itself was closed with  interrupted 5-0 nylon.  The patient tolerated the procedure nicely.  I  anesthetized the muscle and peritoneum with Marcaine to minimize the  postoperative discomfort.   She will be released after a short stay in the recovery room and will see me  back in the office in approximately one week.                                              Anselm Pancoast. Zachery Dakins, M.D.     WJW/MEDQ  D:  08/24/2002  T:  08/24/2002  Job:  [id number]   cc:   Mindi Slicker. Lowell Guitar, M.D.  [address]  [place]  [place] [address]  Fax: [phone]

## 2010-12-01 NOTE — Assessment & Plan Note (Signed)
 HEALTHCARE                           GASTROENTEROLOGY OFFICE NOTE   NAME:SUTPHINChaniece, Barbato                     MRN:          [id number]  DATE:04/02/2006                            DOB:          1946/06/09    CHIEF COMPLAINT:  Nausea, vomiting, diarrhea.   ASSESSMENT:  1. Nausea, vomiting.  I think that is probably related to migraine      headaches.  2. Diarrhea.  I suspect irritable bowel syndrome.  This is improved.  3. 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  There were some      atypical cells.  Dr. Luisa Hart sent the biopsies out to [facility], and      these were reviewed and it was thought she had chronic gastritis      overall.   PLAN:  1. Continue Imodium, though also offered and prescribed Lomotil as needed.  2. She is going to have a headache evaluation at [facility], and I think that      makes a lot of sense.  I think her migraines are causing her nausea and      vomiting, and she may need to be on a daily medication.  3. I am going to see her back in 6 weeks.  Given her immunosuppression and      this gastritis and her symptoms, I do want to recheck the gastritis and      biopsy it just to make sure there is no lymphoma going on here or any      type of chronic viral infection either.  This is explained to the      patient.   Please see my medical history and physical form for other details as  reflected in the chart.                                   Iva Boop, MD,FACG   CEG/MedQ  DD:  04/03/2006  DT:  04/05/2006  Job #:  [id number]   cc:   Mindi Slicker. Lowell Guitar, M.D.  Saul Fordyce

## 2010-12-01 NOTE — Assessment & Plan Note (Signed)
[facility]                           GASTROENTEROLOGY OFFICE NOTE   NAME:Erica Woodward, Erica Woodward                     MRN:          [id number]  DATE:05/14/2006                            DOB:          [birth year]    CHIEF COMPLAINT:  Followup of diarrhea.   [name] was hospitalized and, after a lengthy workup summarized in the  discharge summary of April 20, 2006, it was determined that it was probably  her Cellcept causing diarrhea.  This was discontinued and she was started on  myfortic 360 mg b.i.d.  She still has some mild diarrhea once or twice a day  with loose stools using some Imodium, but is overall much better.  Nausea  and vomiting only tends to occur with migraine headaches.  Her other  medications are listed and reviewed in the chart.  She is down about 5  pounds from her visit in September.  That is about 5 to 6 weeks ago.  She  seems to be eating.  She was quite ill in between that visit with quite a  bit of diarrhea, et Karie Soda, so some of the weight loss may have been due to  that I suppose.   Weight 165 pounds, pulse 80, blood pressure 100/70.   ASSESSMENT:  Chronic recurrent diarrhea, improved.  We think it was due to  Cellcept.   PLAN:  Monitor the weight loss.  If she continues to lose weight and have  these symptoms a capsule endoscopy could be utilized to look at the small  bowel as she is immunosuppressed.  May need tertiary evaluation as well.     Iva Boop, MD,FACG    CEG/MedQ  DD: 05/14/2006  DT: 05/14/2006  Job #: [id number]   cc:   Mindi Slicker. Lowell Guitar, M.D.

## 2010-12-01 NOTE — Op Note (Signed)
[facility]  Patient:    Erica Woodward, Erica Woodward Visit Number: [id number] MRN: [id number]          Service Type: Attending:  Quita Skye. Hart Rochester, M.D. Dictated by:   Quita Skye Hart Rochester, M.D. Proc. Date: 05/28/01 Adm. Date:  05/28/01                             Operative Report  PREOPERATIVE DIAGNOSIS:  End-stage renal disease.  POSTOPERATIVE DIAGNOSIS:  End-stage renal disease.  PROCEDURE:  Creation of a right brachial artery to cephalic vein (Kauffman) fistula.  SURGEON:  Quita Skye. Hart Rochester, M.D.  FIRST ASSISTANT:  Nurse.  ANESTHESIA:  Local.  PROCEDURE:  The patient was taken to the operating room and placed in the supine position at which time the right upper extremity was prepped with Betadine scrub and solution and draped in a routine sterile manner.  After infiltration with 1% Xylocaine with epinephrine transverse incision was made in the antecubital area and antecubital vein dissected free.  The cephalic branch was at least 5 mm in size and could be dilated up to the deltopectoral groove and was widely patent.  The brachial artery was a small but normal appearing vessel.  Then 3000 units of heparin was given intravenously.  The cephalic vein was ligated distally and transected.  After occluding the brachiaL artery with vessel loops proximally and distally it was opened with a 15 blade, extended with a Potts scissors and would easily accept a 3.5 mm dilator.  The vein was carefully measured and spatulated and anastomosed end-to-side using continuous 6-0 Prolene.  The vessels loops were then released and there was a good pulse and thrill in the fistula.  There was also adequate arterial flow in the radial and ulnar arteries distally.  No protamine was given.  The wound was irrigated with saline and closed in layers with Vicryl in a subcuticular fashion and sterile dressing applied and the patient was taken to the recovery room in satisfactory  condition. Dictated by:   Quita Skye Hart Rochester, M.D. Attending:  Quita Skye. Hart Rochester, M.D. DD:  05/28/01 TD:  05/28/01 Job: [id number]

## 2010-12-01 NOTE — Discharge Summary (Signed)
Erica Woodward, FASO              ACCOUNT NO.:  [id number]   MEDICAL RECORD NO.:  [id number]          PATIENT TYPE:  INP   LOCATION:  5730                         FACILITY:  [facility]   PHYSICIAN:  Wilhemina Bonito. Marina Goodell, MD      DATE OF BIRTH:  [birth year]   DATE OF ADMISSION:  04/09/2006  DATE OF DISCHARGE:  04/20/2006                                 DISCHARGE SUMMARY   ADMITTING DIAGNOSES:  1. Chronic diarrhea and weight loss.  Question medication related      diarrhea, question ongoing irritable bowel syndrome.  No evidence for      colitis, celiac disease grossly or microscopically on EGD colonoscopy      recently.  2. Gastritis found on upper endoscopy of February 20, 2006.  Treated with      daily proton pump inhibitor therapy.  3. Hypotension likely secondary to volume depletion and dehydration owing      to a chronic diarrhea.  4. 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      The patient's youngest daughter was the donor.  The patient takes      chronic immunosuppressive's.  5. History of gastroesophageal reflux disease and esophageal stricture.      No dysphagia currently and no stricture seen on recent upper endoscopy.  6. History of esophageal stricture status post esophageal dilatation in      2006.  7. Dyslipidemia.  8. Allergic sinusitis.  9. Chronic headaches.  10.Status post tubal ligation.  11.Status post placement of peritoneal dialysis catheter in 2004, it was      never used and was subsequently removed in April 2005.   DISCHARGE DIAGNOSES:  1. Chronic diarrhea, resolved with change in medications.  2. Gastritis, ongoing treatment with Protonix.  3. Status post renal transplant.  Immunosuppressive therapy suggested      during this admission on advisement of physicians at [facility]  4. Anemia, normocytic.  The patient was treated during this admission with      Aranesp, and this will continue through the renal service as an      outpatient.  5. Renal insufficiency with maximum creatinine of 1.4 and maximum BUN of      16 during this admission.  This resolved with hydration and resolution      of her diarrhea.  6. Elevated serum gastrin level and history of gastritis.  7. Protein malnutrition with low prealbumin level at 13.7.   BRIEF HISTORY:  The patient is a pleasant 65 year old white female.  She is  on chronic immunosuppressive's owing to a kidney transplant in 2005.  Her  renal follow-up is through Dr. Lowell Guitar.  She is seen at [facility] at most  once a year.  The patient had a 5-month history of watery, nonbloody  diarrhea for about 4 months prior to colonoscopy and endoscopy on February 20, 2006.  Random biopsies of grossly normal colon were negative for any  microscopic colitis.  On EGD she had gastritis and some atypical cell showed  up in the  gastric biopsies.  The pathologists in [place] as well as in  [facility] reviewed these biopsies and felt it to represent chronic  gastritis.  There had been plans to repeat the EGD in a few months to assure  that the gastritis was improving after proton pump inhibitor therapy.  There  was some underlying worry and Dr. Marvell Fuller part, that there could be  lymphoma given her chronic immunosuppression.  The patient's diarrhea had  improved somewhat with the use of Imodium when she was seen back in Dr.  Marvell Fuller office on September 18.  However, watery stools recurred up to 10  times a day.  There was no blood in the stools.  There was some cramping  discomfort in the lower abdomen.  She was feeling very weak and fatigued,  and contacted Dr. Marvell Fuller office on September 25.  She was advised to  present to the emergency room where she was admitted to the GI service for  further evaluation and care.  She had some slight orthostatic blood pressure  changes.  Weight loss amounting to about 30 pounds over the last several  months.  Diarrhea was particularly acute  after eating with a strong dumping  type reflux to p.o.'s.  There was some nausea but no vomiting.  On the list  of her.  Her medications was magnesium oxide.  She had a trial several days  of not using this medication but it made no difference in the frequency or  volume of her diarrhea.  She was admitted to Dr. Derl Barrow GI service.   CONSULTATIONS:  Dr. Lowell Guitar and his associates from [facility].   PROCEDURES:  On April 11, 2006 she underwent EGD.  This study showed  antral gastritis.  This study showed gastritis in the antrum and body of the  stomach.  Biopsies were obtained and showed chronic active gastritis with no  Helicobacter pylori.  Small bowel biopsy of benign appearing mucosa showed  no villous atrophy, inflammation or other abnormalities.   LABORATORY DATA:  Initial hemoglobin 11, it was 9.7 at discharge; hematocrit  29.9 at discharge; platelets ranged 248-160.  Differential on CBC with all  cell lines within normal limits.  Erythrocyte sedimentation rate 7.  Sodium  137, potassium as low as 3.4, 4.5 at discharge.  Chloride 109.  CO2 24.  Glucose low at 54, max of 110.  BUN maximum 16, 7 at discharge.  Creatinine  maximum 1.4, 1.0 at discharge.  Calcium low at 7.4.  Albumin low at 2.7.  Prealbumin low at 13.7.  Total bilirubin 0.5, alkaline phosphatase 112, AST  23, ALT 24.  Magnesium initially low at 1, 1.5 at discharge.  Phosphorus  3.3.  Serum carotene less than 10, the reference range is 60-200.  CK 28, CK-  MB 0.3.  Troponin I 0.01.  Iron 96, total iron binding capacity 264, iron  saturation 36%.  B12 246, ferritin 226, folate 365.  Gastrin level 168.  The  reference range 13-115.  Not clear if this gastrin level was fasting or not.  Tacrolimus (FK506) level 15.9.  Urinary 5HIAA level within normal limits  with a total of 1.1.  Urinalysis small amount of leukocyte esterase, many epithelial cells, 3-6 white blood cells and few bacteria.  C.   Diff toxin  negative stool cultures for Salmonella, Shigella, Campylobacter, Yersinia  negative, Giardia, Cryptosporidium screening negative.  Cytomegaly virus by  PCR in the urine no DNA detected/  cytomegalovirus DNA in  the blood, none  detected .   HOSPITAL COURSE:  #1 - CHRONIC DIARRHEA.  The patient had multiple stool  studies performed, none of which were revealing as to the cause of the  diarrhea.  She was treated for several days with Flagyl, to no effect on the  stool frequency.  Stool volume and frequency did decreased with n.p.o.  status.  However, that was only for 24 hours, and then and within the next  24 hours despite limited p.o.'s she still had fairly frequent stools.  The  patient underwent a CT scan of the abdomen after a few days of hydration and  treatment with Mucomyst, in order to protect her kidneys.  This showed only  sigmoid diverticulosis, a normal clear appearing transplanted kidney and a  14 mm cyst in the left liver lobe.  A small bowel follow-through showed no  evidence for Crohn's disease or any inflammation.  There was some filling  defects in the stomach raising possibility of delayed gastric emptying.  Ultimately, we obtained a  phone consult with Dr. Janace Litten office.  He is to  her transplant surgeon at [facility].  He advised discontinuing  the CellCept and replacing it with Myfortic.  Within a couple of days, the  patient's diarrhea had shown significant improvement down to a level of  about two bowel movements from the usual average of anywhere from 8-10 a  day.  It was decided that she would not require transfer to [facility].  Flagyl was discontinued after several days, as there was no  evidence for any infectious diarrhea.  Inquiries were made as to  availability of Myfortic for outpatient use, and she was able to find a  pharmacy to provide this, and she was discharged to home on October 6 in  stable and much improved  condition.   #2 - STATUS POST KIDNEY TRANSPLANT.  Again, prior to the CT scan performed  with IV contrast, she received hydration as well as prophylactic Mucomyst.  She underwent the CT scan on the September 28.  Her creatinine bumped up to  1.4 on October 1 and 2, but thereafter normalized.  The patient had good  urinary output throughout her hospitalization.   #3 - MIGRAINE HEADACHES.  These were treated p.r.n. with oral Imitrex to  resolution.   #4 - DEHYDRATION.  This resolved with adequate hydration and ultimately with  cessation of her diarrhea.   #5 - ANEMIA.  This was treated with a single dose of Aranesp as well as  intramuscular injections of vitamin B12 on three days running.   MEDICATIONS:  1. Imitrex 100 mg p.r.n. headaches.  2. K phos 250 mg 2 p.o. twice daily.  3. Fexofenadine 180 mg once daily.  4. Prograf 1 mg twice daily.  5. Myfortic 360 mg twice daily.  6. Magnesium oxide 450 mg 2 p.o. twice daily. 7. Lexapro 20 mg daily.  8. Oxycodone p.r.n., she uses this rarely.  9. Lipitor 40 mg once daily.  10.Benicar 20 mg once daily.  11.Aciphex 20 mg once daily.  12.Aspirin 81 mg daily.   FOLLOWUP:  Follow-up appointments with Dr. Lowell Guitar would be arranged by his  office within the next 1-2 weeks; with Dr. Jeral Pinch per routine and on an  as-needed basis.     ______________________________  Jennye Moccasin, PA-C    ______________________________  Wilhemina Bonito. Marina Goodell, MD    SG/MEDQ  D:  04/30/2006  T:  05/01/2006  Job:  [id number]   cc:   Mindi Slicker. Lowell Guitar, M.D.  Jeral Pinch, M.D.  Iva Boop, MD,FACG

## 2010-12-01 NOTE — Assessment & Plan Note (Signed)
[facility]                           GASTROENTEROLOGY OFFICE NOTE   NAME:Erica Woodward, Erica Woodward                     MRN:          [id number]  DATE:02/19/2006                            DOB:          [birth year]    CHIEF COMPLAINT:  Diarrhea.   HISTORY:  Erica Woodward is a lady, 65 year old woman status post renal  transplant that has had some diarrhea problems.  When I last saw her in  April, the diarrhea seemed to have resolved after we changed her from  Prevacid to Aciphex.  Aciphex was controlling her dysphagia symptoms.  I had  previously performed a colonoscopy for screening on April 23, 2005, which  showed diverticulosis.  Upper endoscopy on the same date demonstrated a  benign esophageal stricture dilated to 18 mm.  She had a small hiatal hernia  as well as a nonerosive gastritis.  Sometime around May or so, she developed  recurrent diarrhea.  She has been back to see Saul Fordyce, NP, recently and  had been given a prednisone taper over a week that has not provided any  benefit.  There has been no fever or bleeding.  She has had some nausea and  vomiting now, twenty-pound weight loss noted, not hungry.  She feels weak  and somewhat puny she says.  She has stopped her magnesium oxide.  Her other  medications are Lexapro 20 mg daily, Lipitor 40 mg daily, Allegra 180 mg  daily, Prograf 1 mg b.i.d., potassium phosphate __________  2 tablets  t.i.d., CellCept 250 mg b.i.d., Aciphex 20 mg daily, Imodium p.r.n., not  really helping, Imitrex p.r.n.   NOTE:  She has had 8 watery stools so far today.  She has them at night as  well.  There is some mild crampy lower abdominal pain as well.   PAST MEDICAL HISTORY:  1.  Colonoscopy and EGD, findings as above with esophageal reflux disease.  2.  Hypertension.  3.  Dyslipidemia.  4.  Allergic sinusitis.  5.  Chronic headaches.  6.  Tubal ligation.  7.  End-stage renal disease, living related transplant 3  years ago on      chronic immunosuppression.   FAMILY HISTORY:  Noncontributory.   SOCIAL HISTORY:  She is here with her daughter.  She is divorced.   REVIEW OF SYSTEMS:  Weight loss as above.  Constitutional as above.   PHYSICAL EXAMINATION:  GENERAL:  Well-developed, slightly ill-appearing,  middle age, white woman.  Height 5 feet 4 inches.  Weight 176.  She was 196  pounds back in April.  VITAL SIGNS:  Pulse 48, blood pressure 126/72.  HEENT:  Eyes are anicteric.  NECK:  Supple.  CHEST:  Clear.  HEART:  S1, S2, no rubs, murmurs or gallops are heard, somewhat bradycardic.  EXTREMITIES:  Without edema.  ABDOMEN:  Soft, nontender without organomegaly or mass.  There is no obvious  hernia present.  RECTAL:  Deferred.  SKIN:  Area is inspected of the trunk.  Lower extremities show no rash and  neither does the face.  NEURO:  She is alert and oriented x3.  She did have some stool studies apparently that were negative.  I do not  have those results.   ASSESSMENT:  Diarrhea, nausea, vomiting, weight loss.  These are chronic  symptoms.  She is immunosuppressed from her renal transplant.  She is  certainly at high risk of things like CMV infections causing these symptoms.  She has not fever which typically goes along with that, but we must be  concerned about fever.  Clostridium difficile disease seems possible as  well, though I would expect her to be more toxic, though not always an  issue.   PLAN:  1.  Upper GI endoscopy and flexible sigmoidoscopy versus colonoscopy (no      prep given the watery diarrhea) tomorrow at 11:30.  Risks, benefits, and      indications reviewed.  Plan for random biopsies of the stomach and the      intestines.  2.  CBC and C-Met today.   Further plan pending the above.                                   Iva Boop, MD, Clementeen Graham   CEG/MedQ  DD:  02/19/2006  DT:  02/19/2006  Job #:  [id number]   cc:   Saul Fordyce, MD  Mindi Slicker Lowell Guitar, MD

## 2013-03-09 ENCOUNTER — Encounter (HOSPITAL_COMMUNITY): Payer: Self-pay | Admitting: Emergency Medicine

## 2013-03-09 ENCOUNTER — Emergency Department (HOSPITAL_COMMUNITY): Payer: Medicare Other

## 2013-03-09 ENCOUNTER — Emergency Department (HOSPITAL_COMMUNITY)
Admission: EM | Admit: 2013-03-09 | Discharge: 2013-03-09 | Disposition: A | Discharge status: Home / Self Care | Payer: Medicare Other | Attending: Emergency Medicine | Admitting: Emergency Medicine

## 2013-03-09 DIAGNOSIS — E860 Dehydration: Secondary | ICD-10-CM | POA: Insufficient documentation

## 2013-03-09 DIAGNOSIS — Z79899 Other long term (current) drug therapy: Secondary | ICD-10-CM | POA: Insufficient documentation

## 2013-03-09 DIAGNOSIS — R61 Generalized hyperhidrosis: Secondary | ICD-10-CM | POA: Insufficient documentation

## 2013-03-09 DIAGNOSIS — R1110 Vomiting, unspecified: Secondary | ICD-10-CM

## 2013-03-09 DIAGNOSIS — R000 Tachycardia, unspecified: Secondary | ICD-10-CM | POA: Insufficient documentation

## 2013-03-09 DIAGNOSIS — R197 Diarrhea, unspecified: Secondary | ICD-10-CM | POA: Insufficient documentation

## 2013-03-09 DIAGNOSIS — N289 Disorder of kidney and ureter, unspecified: Secondary | ICD-10-CM | POA: Insufficient documentation

## 2013-03-09 DIAGNOSIS — R0789 Other chest pain: Secondary | ICD-10-CM | POA: Insufficient documentation

## 2013-03-09 DIAGNOSIS — Z940 Kidney transplant status: Secondary | ICD-10-CM | POA: Insufficient documentation

## 2013-03-09 DIAGNOSIS — Z7982 Long term (current) use of aspirin: Secondary | ICD-10-CM | POA: Insufficient documentation

## 2013-03-09 DIAGNOSIS — M6281 Muscle weakness (generalized): Secondary | ICD-10-CM | POA: Insufficient documentation

## 2013-03-09 DIAGNOSIS — R112 Nausea with vomiting, unspecified: Secondary | ICD-10-CM | POA: Insufficient documentation

## 2013-03-09 DIAGNOSIS — R5381 Other malaise: Secondary | ICD-10-CM | POA: Insufficient documentation

## 2013-03-09 HISTORY — DX: Disorder of kidney and ureter, unspecified: N28.9

## 2013-03-09 LAB — COMPREHENSIVE METABOLIC PANEL
ALT: 19 U/L (ref 0–35)
AST: 21 U/L (ref 0–37)
Albumin: 4 g/dL (ref 3.5–5.2)
Alkaline Phosphatase: 94 U/L (ref 39–117)
Potassium: 3.8 mEq/L (ref 3.5–5.1)
Sodium: 136 mEq/L (ref 135–145)
Total Protein: 7.5 g/dL (ref 6.0–8.3)

## 2013-03-09 LAB — URINALYSIS, ROUTINE W REFLEX MICROSCOPIC
Glucose, UA: NEGATIVE mg/dL
Hgb urine dipstick: NEGATIVE
Protein, ur: NEGATIVE mg/dL
Specific Gravity, Urine: 1.026 (ref 1.005–1.030)

## 2013-03-09 LAB — URINE MICROSCOPIC-ADD ON

## 2013-03-09 LAB — CBC WITH DIFFERENTIAL/PLATELET
Basophils Absolute: 0 10*3/uL (ref 0.0–0.1)
Eosinophils Relative: 0 % (ref 0–5)
HCT: 44 % (ref 36.0–46.0)
Lymphocytes Relative: 25 % (ref 12–46)
MCH: 31.5 pg (ref 26.0–34.0)
MCV: 91.9 fL (ref 78.0–100.0)
Monocytes Absolute: 0.7 10*3/uL (ref 0.1–1.0)
RDW: 13.1 % (ref 11.5–15.5)
WBC: 8.5 10*3/uL (ref 4.0–10.5)

## 2013-03-09 LAB — TROPONIN I: Troponin I: <0.3 ng/mL (ref <0.30)

## 2013-03-09 MED ORDER — ONDANSETRON 4 MG PO TBDP: ORAL_TABLET | ORAL | Status: DC

## 2013-03-09 MED ORDER — SODIUM CHLORIDE 0.9 % IV BOLUS (SEPSIS)
500.0000 mL | Freq: Once | INTRAVENOUS | Status: AC
  Administered 2013-03-09: 500 mL via INTRAVENOUS

## 2013-03-09 NOTE — ED Provider Notes (Signed)
CSN: [id number]     Arrival date & time 03/09/13  1408 History     First MD Initiated Contact with Patient 03/09/13 1501     Chief Complaint  Patient presents with  . Chest Pain   (Consider location/radiation/quality/duration/timing/severity/associated sxs/prior Treatment) HPI Pt with prev hx of renal transplant p/w family for episodic vomiting for several days and multiple episodes of diarrhea today. Pt went to UC initially and family reported pt c/o CP several days back. Pt does not remember having any CP and denies current pain including abdominal pain. No new urinary symptoms. Biggest complaints seems to be generalized weakness and family believes she may be dehydrated. No focal weakness or sensory deficits.  Past Medical History  Diagnosis Date  . Renal disorder     kidney transplant 2005   Past Surgical History  Procedure Laterality Date  . Kidney transplant     No family history on file. History  Substance Use Topics  . Smoking status: Never Smoker   . Smokeless tobacco: Not on file  . Alcohol Use: No   OB History   Grav Para Term Preterm Abortions TAB SAB Ect Mult Living                 Review of Systems  Constitutional: Positive for diaphoresis and fatigue. Negative for fever and chills.  HENT: Negative for congestion, sore throat, rhinorrhea, neck pain and neck stiffness.   Eyes: Negative for visual disturbance.  Respiratory: Negative for cough, shortness of breath and wheezing.   Cardiovascular: Negative for chest pain, palpitations and leg swelling.  Gastrointestinal: Positive for nausea, vomiting and diarrhea. Negative for abdominal pain, blood in stool and abdominal distention.  Genitourinary: Negative for dysuria, frequency, hematuria and flank pain.  Musculoskeletal: Negative for myalgias and back pain.  Skin: Negative for pallor, rash and wound.  Neurological: Positive for weakness (generalized). Negative for dizziness, syncope, light-headedness, numbness  and headaches.  All other systems reviewed and are negative.    Allergies  Review of patient's allergies indicates no known allergies.  Home Medications   Current Outpatient Rx  Name  Route  Sig  Dispense  Refill  . aspirin EC 81 MG tablet   Oral   Take 81 mg by mouth daily.         Marland Kitchen atorvastatin (LIPITOR) 40 MG tablet   Oral   Take 40 mg by mouth daily.         Marland Kitchen escitalopram (LEXAPRO) 20 MG tablet   Oral   Take 20 mg by mouth daily.         Marland Kitchen esomeprazole (NEXIUM) 40 MG capsule   Oral   Take 40 mg by mouth daily.         . magnesium oxide (MAG-OX) 400 MG tablet   Oral   Take 800 mg by mouth 2 (two) times daily.         . mycophenolate (MYFORTIC) 360 MG TBEC EC tablet   Oral   Take 360 mg by mouth 2 (two) times daily.         . phosphorus (K PHOS NEUTRAL) 155-852-130 MG tablet   Oral   Take 1 tablet by mouth 2 (two) times daily.         . tacrolimus (PROGRAF) 1 MG capsule   Oral   Take 1 mg by mouth 2 (two) times daily.         Marland Kitchen topiramate (TOPAMAX) 25 MG tablet   Oral   Take  25 mg by mouth 2 (two) times daily.         . ondansetron (ZOFRAN ODT) 4 MG disintegrating tablet      4mg  ODT q4 hours prn nausea/vomit   8 tablet   0    BP 112/72  Pulse 83  Temp(Src) 97.6 F (36.4 C)  Resp 15  SpO2 94% Physical Exam  Nursing note and vitals reviewed. Constitutional: She is oriented to person, place, and time. She appears well-developed and well-nourished. No distress.  HENT:  Head: Normocephalic and atraumatic.  Mouth/Throat: Oropharynx is clear and moist. No oropharyngeal exudate.  Eyes: EOM are normal. Pupils are equal, round, and reactive to light.  Neck: Normal range of motion. Neck supple.  Cardiovascular: Regular rhythm.   tachycardia  Pulmonary/Chest: Effort normal and breath sounds normal. No respiratory distress. She has no wheezes. She has no rales. She exhibits no tenderness.  Abdominal: Soft. Bowel sounds are normal. She  exhibits no distension and no mass. There is no tenderness. There is no rebound and no guarding.  Musculoskeletal: Normal range of motion. She exhibits no edema and no tenderness.  No CVAT  Neurological: She is alert and oriented to person, place, and time.  5/5 motor in all ext, sensation intact  Skin: Skin is warm and dry. No rash noted. No erythema.  Psychiatric: She has a normal mood and affect. Her behavior is normal.    ED Course   Procedures (including critical care time)  Labs Reviewed  COMPREHENSIVE METABOLIC PANEL - Abnormal; Notable for the following:    Glucose, Bld 106 (*)    Creatinine, Ser 1.22 (*)    GFR calc non Af Amer 45 (*)    GFR calc Af Amer 52 (*)    All other components within normal limits  CBC WITH DIFFERENTIAL - Abnormal; Notable for the following:    Hemoglobin 15.1 (*)    All other components within normal limits  URINALYSIS, ROUTINE W REFLEX MICROSCOPIC - Abnormal; Notable for the following:    APPearance CLOUDY (*)    Bilirubin Urine SMALL (*)    Ketones, ur 15 (*)    Leukocytes, UA TRACE (*)    All other components within normal limits  URINE MICROSCOPIC-ADD ON - Abnormal; Notable for the following:    Squamous Epithelial / LPF MANY (*)    All other components within normal limits  TROPONIN I  POCT I-STAT TROPONIN I   Dg Chest 2 View  03/09/2013   CLINICAL DATA:  Chest pain. Kidney transplant patient.  EXAM: CHEST  2 VIEW  COMPARISON:  03/04/10  FINDINGS: The heart size and mediastinal contours are within normal limits. Both lungs are clear. The visualized skeletal structures are unremarkable.  IMPRESSION: No active cardiopulmonary disease.   Electronically Signed   By: Myles Rosenthal   On: 03/09/2013 15:02   Dg Abd 2 Views  03/09/2013   *RADIOLOGY REPORT*  Clinical Data: Nausea and vomiting.  ABDOMEN - 2 VIEW  Comparison: CT of the abdomen and pelvis on 04/12/2006.  Findings: Abdominal films demonstrate no evidence of bowel obstruction or ileus.   No free air is identified.  No abnormal calcifications or soft tissue abnormalities are seen.  Clips are located in the pelvis.  Mild degenerative changes are present in the lower lumbar spine.  IMPRESSION: No acute abnormalities identified.   Original Report Authenticated By: Irish Lack, M.D.   1. Vomiting   2. Dehydration   3. Atypical chest pain  Date: 03/09/2013  Rate: 107  Rhythm: sinus tachycardia  QRS Axis: normal  Intervals: normal  ST/T Wave abnormalities: nonspecific T wave changes  Conduction Disutrbances:none  Narrative Interpretation:   Old EKG Reviewed: none available   MDM  Pt states she is feeling much better after IVF's. No vomiting in ED. Symptoms likely related to gastroenteritis and dehydration. CP sounds very atypical and trop x 2 as well as EKG without concerning findings. Will d/c home with strict return precautions and advised to f/u with cardiology.   Loren Racer, MD 03/09/13 2056

## 2013-03-09 NOTE — ED Notes (Signed)
Feeling bad x 3 weeks went to [facility] [place] and had ekg done and was sent her for further tests

## 2013-03-09 NOTE — ED Notes (Signed)
Pt discharged.Vital signs stable and GCS 15 

## 2013-07-26 HISTORY — PX: KIDNEY TRANSPLANT: SHX239

## 2014-06-15 ENCOUNTER — Encounter (HOSPITAL_COMMUNITY): Payer: Self-pay | Admitting: Emergency Medicine

## 2014-06-15 ENCOUNTER — Emergency Department (HOSPITAL_COMMUNITY): Payer: Medicare Other

## 2014-06-15 ENCOUNTER — Inpatient Hospital Stay (HOSPITAL_COMMUNITY)
Admission: EM | Admit: 2014-06-15 | Discharge: 2014-06-17 | DRG: 375 | Disposition: A | Discharge status: Other Acute Inpatient Hospital | Payer: Medicare Other | Attending: Internal Medicine | Admitting: Internal Medicine

## 2014-06-15 DIAGNOSIS — Z940 Kidney transplant status: Secondary | ICD-10-CM | POA: Diagnosis not present

## 2014-06-15 DIAGNOSIS — M4316 Spondylolisthesis, lumbar region: Secondary | ICD-10-CM | POA: Diagnosis present

## 2014-06-15 DIAGNOSIS — Z6831 Body mass index (BMI) 31.0-31.9, adult: Secondary | ICD-10-CM

## 2014-06-15 DIAGNOSIS — K219 Gastro-esophageal reflux disease without esophagitis: Secondary | ICD-10-CM | POA: Diagnosis present

## 2014-06-15 DIAGNOSIS — Z79899 Other long term (current) drug therapy: Secondary | ICD-10-CM

## 2014-06-15 DIAGNOSIS — R1032 Left lower quadrant pain: Secondary | ICD-10-CM

## 2014-06-15 DIAGNOSIS — H7290 Unspecified perforation of tympanic membrane, unspecified ear: Secondary | ICD-10-CM | POA: Diagnosis present

## 2014-06-15 DIAGNOSIS — F329 Major depressive disorder, single episode, unspecified: Secondary | ICD-10-CM | POA: Diagnosis present

## 2014-06-15 DIAGNOSIS — Z841 Family history of disorders of kidney and ureter: Secondary | ICD-10-CM

## 2014-06-15 DIAGNOSIS — R634 Abnormal weight loss: Secondary | ICD-10-CM | POA: Diagnosis present

## 2014-06-15 DIAGNOSIS — N289 Disorder of kidney and ureter, unspecified: Secondary | ICD-10-CM | POA: Diagnosis present

## 2014-06-15 DIAGNOSIS — N261 Atrophy of kidney (terminal): Secondary | ICD-10-CM | POA: Diagnosis present

## 2014-06-15 DIAGNOSIS — R109 Unspecified abdominal pain: Secondary | ICD-10-CM | POA: Diagnosis present

## 2014-06-15 DIAGNOSIS — M109 Gout, unspecified: Secondary | ICD-10-CM | POA: Diagnosis present

## 2014-06-15 DIAGNOSIS — K449 Diaphragmatic hernia without obstruction or gangrene: Secondary | ICD-10-CM | POA: Diagnosis present

## 2014-06-15 DIAGNOSIS — C569 Malignant neoplasm of unspecified ovary: Secondary | ICD-10-CM | POA: Diagnosis present

## 2014-06-15 DIAGNOSIS — I10 Essential (primary) hypertension: Secondary | ICD-10-CM | POA: Diagnosis present

## 2014-06-15 DIAGNOSIS — M1288 Other specific arthropathies, not elsewhere classified, other specified site: Secondary | ICD-10-CM | POA: Diagnosis present

## 2014-06-15 DIAGNOSIS — C800 Disseminated malignant neoplasm, unspecified: Secondary | ICD-10-CM

## 2014-06-15 DIAGNOSIS — C786 Secondary malignant neoplasm of retroperitoneum and peritoneum: Secondary | ICD-10-CM | POA: Diagnosis present

## 2014-06-15 DIAGNOSIS — R112 Nausea with vomiting, unspecified: Secondary | ICD-10-CM | POA: Diagnosis present

## 2014-06-15 DIAGNOSIS — Z7982 Long term (current) use of aspirin: Secondary | ICD-10-CM | POA: Diagnosis not present

## 2014-06-15 DIAGNOSIS — E785 Hyperlipidemia, unspecified: Secondary | ICD-10-CM | POA: Diagnosis present

## 2014-06-15 DIAGNOSIS — K5660 Unspecified intestinal obstruction: Secondary | ICD-10-CM | POA: Diagnosis present

## 2014-06-15 DIAGNOSIS — R059 Cough, unspecified: Secondary | ICD-10-CM

## 2014-06-15 DIAGNOSIS — R05 Cough: Secondary | ICD-10-CM

## 2014-06-15 DIAGNOSIS — D638 Anemia in other chronic diseases classified elsewhere: Secondary | ICD-10-CM | POA: Diagnosis present

## 2014-06-15 DIAGNOSIS — I7090 Unspecified atherosclerosis: Secondary | ICD-10-CM | POA: Diagnosis present

## 2014-06-15 HISTORY — DX: Essential (primary) hypertension: I10

## 2014-06-15 LAB — I-STAT CHEM 8, ED
BUN: 8 mg/dL (ref 6–23)
CALCIUM ION: 1.14 mmol/L (ref 1.13–1.30)
CHLORIDE: 104 meq/L (ref 96–112)
CREATININE: 1.1 mg/dL (ref 0.50–1.10)
GLUCOSE: 100 mg/dL — AB (ref 70–99)
HCT: 38 % (ref 36.0–46.0)
Hemoglobin: 12.9 g/dL (ref 12.0–15.0)
Potassium: 3.5 mEq/L — ABNORMAL LOW (ref 3.7–5.3)
Sodium: 137 mEq/L (ref 137–147)
TCO2: 18 mmol/L (ref 0–100)

## 2014-06-15 LAB — URINE MICROSCOPIC-ADD ON

## 2014-06-15 LAB — URINALYSIS, ROUTINE W REFLEX MICROSCOPIC
BILIRUBIN URINE: NEGATIVE
Glucose, UA: NEGATIVE mg/dL
HGB URINE DIPSTICK: NEGATIVE
Ketones, ur: NEGATIVE mg/dL
NITRITE: NEGATIVE
PH: 5 (ref 5.0–8.0)
Protein, ur: NEGATIVE mg/dL
SPECIFIC GRAVITY, URINE: 1.022 (ref 1.005–1.030)
Urobilinogen, UA: 1 mg/dL (ref 0.0–1.0)

## 2014-06-15 LAB — CBC WITH DIFFERENTIAL/PLATELET
BASOS ABS: 0 10*3/uL (ref 0.0–0.1)
Basophils Relative: 0 % (ref 0–1)
EOS PCT: 1 % (ref 0–5)
Eosinophils Absolute: 0.1 10*3/uL (ref 0.0–0.7)
HEMATOCRIT: 37 % (ref 36.0–46.0)
HEMOGLOBIN: 12.4 g/dL (ref 12.0–15.0)
LYMPHS ABS: 1.5 10*3/uL (ref 0.7–4.0)
LYMPHS PCT: 22 % (ref 12–46)
MCH: 31.6 pg (ref 26.0–34.0)
MCHC: 33.5 g/dL (ref 30.0–36.0)
MCV: 94.1 fL (ref 78.0–100.0)
MONO ABS: 0.6 10*3/uL (ref 0.1–1.0)
MONOS PCT: 9 % (ref 3–12)
NEUTROS ABS: 4.6 10*3/uL (ref 1.7–7.7)
Neutrophils Relative %: 68 % (ref 43–77)
Platelets: 328 10*3/uL (ref 150–400)
RBC: 3.93 MIL/uL (ref 3.87–5.11)
RDW: 12.4 % (ref 11.5–15.5)
WBC: 6.7 10*3/uL (ref 4.0–10.5)

## 2014-06-15 LAB — I-STAT CG4 LACTIC ACID, ED: LACTIC ACID, VENOUS: 4.69 mmol/L — AB (ref 0.5–2.2)

## 2014-06-15 LAB — LIPASE, BLOOD: LIPASE: 17 U/L (ref 11–59)

## 2014-06-15 MED ORDER — SODIUM CHLORIDE 0.9 % IV SOLN
1000.0000 mL | Freq: Once | INTRAVENOUS | Status: AC
  Administered 2014-06-15: 1000 mL via INTRAVENOUS

## 2014-06-15 MED ORDER — FENTANYL CITRATE 0.05 MG/ML IJ SOLN
50.0000 ug | Freq: Once | INTRAMUSCULAR | Status: AC
  Administered 2014-06-15: 50 ug via INTRAVENOUS
  Filled 2014-06-15: qty 2

## 2014-06-15 MED ORDER — ONDANSETRON HCL 4 MG/2ML IJ SOLN
4.0000 mg | Freq: Once | INTRAMUSCULAR | Status: AC
  Administered 2014-06-15: 4 mg via INTRAVENOUS
  Filled 2014-06-15: qty 2

## 2014-06-15 MED ORDER — IOHEXOL 300 MG/ML  SOLN
25.0000 mL | INTRAMUSCULAR | Status: AC
  Administered 2014-06-15: 25 mL via ORAL

## 2014-06-15 NOTE — ED Notes (Signed)
Pt to CT at this time.

## 2014-06-15 NOTE — ED Notes (Signed)
CG-4 results reported to Dr. Vanita Panda

## 2014-06-15 NOTE — ED Provider Notes (Signed)
CSN: [id number]     Arrival date & time 06/15/14  1711 History   First MD Initiated Contact with Patient 06/15/14 1818     Chief Complaint  Patient presents with  . Abdominal Pain     HPI   patient presents with concern of new abdominal pain, nausea, vomiting, decreased urine output. Symptoms began 4 days ago.  Since onset symptoms been progressive.  No new fever.  Patient does complain of generalized discomfort, increased abdominal size. Patient has a history of renal transplant 10 years ago. No complication. Since onset, no clear alleviating or exacerbating factors. No confusion, disorganized, chest pain, dyspnea.  Past Medical History  Diagnosis Date  . Renal disorder     kidney transplant 2005  . Hypertension    Past Surgical History  Procedure Laterality Date  . Kidney transplant     No family history on file. History  Substance Use Topics  . Smoking status: Never Smoker   . Smokeless tobacco: Not on file  . Alcohol Use: No   OB History    No data available     Review of Systems  Constitutional:       Per HPI, otherwise negative  HENT:       Per HPI, otherwise negative  Respiratory:       Per HPI, otherwise negative  Cardiovascular:       Per HPI, otherwise negative  Gastrointestinal: Positive for vomiting.  Endocrine:       Negative aside from HPI  Genitourinary:       Neg aside from HPI   Musculoskeletal:       Per HPI, otherwise negative  Skin: Negative.   Neurological: Negative for syncope.      Allergies  Review of patient's allergies indicates no known allergies.  Home Medications   Prior to Admission medications   Medication Sig Start Date End Date Taking? Authorizing Provider  aspirin EC 81 MG tablet Take 81 mg by mouth daily.   Yes Historical Provider, MD  atorvastatin (LIPITOR) 40 MG tablet Take 40 mg by mouth daily.   Yes Historical Provider, MD  cetirizine (ZYRTEC) 10 MG tablet Take 10 mg by mouth at bedtime.   Yes Historical  Provider, MD  escitalopram (LEXAPRO) 20 MG tablet Take 20 mg by mouth daily.   Yes Historical Provider, MD  esomeprazole (NEXIUM) 40 MG capsule Take 40 mg by mouth daily.   Yes Historical Provider, MD  magnesium oxide (MAG-OX) 400 MG tablet Take 800 mg by mouth 2 (two) times daily.   Yes Historical Provider, MD  mycophenolate (MYFORTIC) 180 MG EC tablet Take 360 mg by mouth 2 (two) times daily.   Yes Historical Provider, MD  ondansetron (ZOFRAN ODT) 4 MG disintegrating tablet 4mg  ODT q4 hours prn nausea/vomit Patient taking differently: Take 4 mg by mouth every 4 (four) hours as needed for nausea or vomiting.  03/09/13  Yes Julianne Rice, MD  phosphorus (K PHOS NEUTRAL) [phone] MG tablet Take 1 tablet by mouth 2 (two) times daily.   Yes Historical Provider, MD  tacrolimus (PROGRAF) 1 MG capsule Take 1 mg by mouth 2 (two) times daily.   Yes Historical Provider, MD  topiramate (TOPAMAX) 25 MG tablet Take 25 mg by mouth 2 (two) times daily as needed (migraine).    Yes Historical Provider, MD   BP 106/62 mmHg  Pulse 85  Temp(Src) 97.9 F (36.6 C) (Oral)  Resp 22  SpO2 91% Physical Exam  Constitutional: She is oriented  to person, place, and time. She appears well-developed and well-nourished. No distress.  HENT:  Head: Normocephalic and atraumatic.  Eyes: Conjunctivae and EOM are normal.  Cardiovascular: Normal rate and regular rhythm.   Pulmonary/Chest: Effort normal and breath sounds normal. No stridor. No respiratory distress.  Abdominal: She exhibits no distension. There is no hepatosplenomegaly. There is tenderness in the left upper quadrant and left lower quadrant. There is no rigidity and no guarding.  Musculoskeletal: She exhibits no edema.  Neurological: She is alert and oriented to person, place, and time. No cranial nerve deficit.  Skin: Skin is warm and dry.  Psychiatric: She has a normal mood and affect.  Nursing note and vitals reviewed.   ED Course  Procedures  (including critical care time) Labs Review Labs Reviewed  URINALYSIS, ROUTINE W REFLEX MICROSCOPIC - Abnormal; Notable for the following:    Leukocytes, UA SMALL (*)    All other components within normal limits  URINE MICROSCOPIC-ADD ON - Abnormal; Notable for the following:    Squamous Epithelial / LPF MANY (*)    Bacteria, UA FEW (*)    All other components within normal limits  I-STAT CHEM 8, ED - Abnormal; Notable for the following:    Potassium 3.5 (*)    Glucose, Bld 100 (*)    All other components within normal limits  I-STAT CG4 LACTIC ACID, ED - Abnormal; Notable for the following:    Lactic Acid, Venous 4.69 (*)    All other components within normal limits  CBC WITH DIFFERENTIAL  LIPASE, BLOOD    Imaging Review Ct Abdomen Pelvis Wo Contrast  06/15/2014   CLINICAL DATA:  68 year old female with 2 day history of left lower abdominal pain. History of kidney transplant 2005.  EXAM: CT ABDOMEN AND PELVIS WITHOUT CONTRAST  TECHNIQUE: Multidetector CT imaging of the abdomen and pelvis was performed following the standard protocol without IV contrast.  COMPARISON:  Prior CT abdomen/pelvis 04/12/2006  FINDINGS: Lower Chest: The lung bases are clear. Visualized cardiac structures are within normal limits for size. No pericardial effusion. Small hiatal hernia.  Abdomen: Unenhanced CT was performed per clinician order. Lack of IV contrast limits sensitivity and specificity, especially for evaluation of abdominal/pelvic solid viscera. Unremarkable CT appearance of the stomach, duodenum, spleen, adrenal glands and pancreas save for fatty atrophy of the neck, head and uncinate process. Stable segment 2 hepatic cyst. Liver contours are within normal limits. No new discrete hepatic lesion. Gallbladder is unremarkable. No intra or extrahepatic biliary ductal dilatation. Native kidneys are markedly atrophic.  Numerous loops of mildly dilated small bowel proximally bleeding up to a focal segment of  diffusely abnormal small bowel with circumferential irregular wall thickening significantly narrowing the lumen in resulting in partial obstruction. 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 Diffuse soft tissue nodularity lining the peritoneal lining of the pelvis. The at next a and uterus are ill-defined. There is a 3.1 cm water attenuation cystic lesion associated with the right ovary. This measures enlarged compared to 1.9 cm previously.  Right lower quadrant renal transplant without evidence of hydronephrosis or acute abnormality.  Pelvis: Diffuse soft tissue caking throughout the peritoneal lining of the pelvis. The adnexa and uterus are nearly inseparable. There is a 3.1 cm water attenuation cystic lesion affiliated with the right adnexum which has enlarged compared to  the 1.9 x 1.8 cm previously. Bilateral tubal ligation clips are present.  Bones/Soft Tissues: No acute fracture or aggressive appearing lytic or blastic osseous lesion. Lower lumbar facet arthropathy worse on the right than the left. Degenerative grade 1 anterolisthesis of L4 on L5. No significant interval progression compared to prior.  Vascular: Limited evaluation in the absence of intravenous contrast. Scattered atherosclerotic vascular calcifications without aneurysmal dilatation.  IMPRESSION: 1. 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 Circumferential mass like thickening of distal small bowel in the right lower quadrant results in partial small bowel obstruction. 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 Differential  considerations include primary small bowel lymphoma (given known renal transplant presumed longstanding immunosuppressive therapy) and ovarian adenocarcinoma. Unfortunately, I see no discrete lesion that would be amenable to image guided percutaneous biopsy. The patient may require laparoscopic sampling for tissue diagnosis. 2. Interval enlargement of a water attenuation cystic lesion in the right adnexa. This may represent a primary ovarian malignancy or incidental enlargement of and otherwise benign ovarian cysts. 3. Small hiatal hernia. 4. Atrophic native kidneys. 5. Unremarkable right lower quadrant renal transplants. 6. Lower lumbar facet arthropathy and degenerative anterolisthesis of L4 on L5. 7. Atherosclerotic vascular calcifications.   Electronically Signed   By: Jacqulynn Cadet M.D.   On: 06/15/2014 23:10     pulse oximetry 100% room air normal  On repeat exam the patient is more comfortable, having received boluses of analgesia. I reviewed the CT findings with the patient and her daughter.  MDM   This elderly female with history of renal transplant in the distant past presents with abdominal pain. Patient continues to have bowel movements, and is awake and alert, appropriately interactive. Given the patient's persistent pain in spite of multiple doses of medication, lactic acidosis, new findings consistent with carcinomatosis, the patient was admitted for further evaluation and management.    Carmin Muskrat, MD 06/15/14 [id number]

## 2014-06-15 NOTE — ED Notes (Signed)
Pt remains monitored by blood pressure, pulse ox, and 5 lead. pts family remains at bedside.  

## 2014-06-15 NOTE — ED Notes (Addendum)
Pt c/o pain in lower abd.  St's pain worse on left.  Onset 2 days ago.  Pt also c/o urinary frequency with small amounts.  Color pale.  Hx of kidney transplant 2005

## 2014-06-15 NOTE — ED Notes (Signed)
Pt remains monitored by blood pressure, pulse ox, and 5 lead. pts family remains at bedside. Called CT to notify pt had finished contrast.

## 2014-06-16 ENCOUNTER — Other Ambulatory Visit: Payer: Self-pay | Admitting: Hematology

## 2014-06-16 ENCOUNTER — Encounter (HOSPITAL_COMMUNITY): Payer: Self-pay | Admitting: *Deleted

## 2014-06-16 ENCOUNTER — Inpatient Hospital Stay (HOSPITAL_COMMUNITY): Payer: Medicare Other

## 2014-06-16 DIAGNOSIS — Z940 Kidney transplant status: Secondary | ICD-10-CM

## 2014-06-16 DIAGNOSIS — C800 Disseminated malignant neoplasm, unspecified: Secondary | ICD-10-CM | POA: Insufficient documentation

## 2014-06-16 DIAGNOSIS — C801 Malignant (primary) neoplasm, unspecified: Secondary | ICD-10-CM

## 2014-06-16 DIAGNOSIS — K219 Gastro-esophageal reflux disease without esophagitis: Secondary | ICD-10-CM | POA: Diagnosis present

## 2014-06-16 DIAGNOSIS — R1084 Generalized abdominal pain: Secondary | ICD-10-CM

## 2014-06-16 DIAGNOSIS — D638 Anemia in other chronic diseases classified elsewhere: Secondary | ICD-10-CM

## 2014-06-16 LAB — COMPREHENSIVE METABOLIC PANEL
ALT: 6 U/L (ref 0–35)
ANION GAP: 15 (ref 5–15)
AST: 16 U/L (ref 0–37)
Albumin: 2.8 g/dL — ABNORMAL LOW (ref 3.5–5.2)
Alkaline Phosphatase: 66 U/L (ref 39–117)
BUN: 9 mg/dL (ref 6–23)
CO2: 21 mEq/L (ref 19–32)
Calcium: 8.4 mg/dL (ref 8.4–10.5)
Chloride: 103 mEq/L (ref 96–112)
Creatinine, Ser: 0.94 mg/dL (ref 0.50–1.10)
GFR calc non Af Amer: 61 mL/min — ABNORMAL LOW (ref >90)
GFR, EST AFRICAN AMERICAN: 71 mL/min — AB (ref >90)
GLUCOSE: 91 mg/dL (ref 70–99)
Potassium: 3.8 mEq/L (ref 3.7–5.3)
SODIUM: 139 meq/L (ref 137–147)
Total Bilirubin: 0.6 mg/dL (ref 0.3–1.2)
Total Protein: 5.9 g/dL — ABNORMAL LOW (ref 6.0–8.3)

## 2014-06-16 LAB — LIPID PANEL
CHOL/HDL RATIO: 2.5 ratio
Cholesterol: 134 mg/dL (ref 0–200)
HDL: 54 mg/dL (ref >39)
LDL CALC: 62 mg/dL (ref 0–99)
TRIGLYCERIDES: 91 mg/dL (ref <150)
VLDL: 18 mg/dL (ref 0–40)

## 2014-06-16 LAB — CBC
HCT: 35.5 % — ABNORMAL LOW (ref 36.0–46.0)
HEMOGLOBIN: 11.6 g/dL — AB (ref 12.0–15.0)
MCH: 30.1 pg (ref 26.0–34.0)
MCHC: 32.7 g/dL (ref 30.0–36.0)
MCV: 92 fL (ref 78.0–100.0)
Platelets: 300 10*3/uL (ref 150–400)
RBC: 3.86 MIL/uL — ABNORMAL LOW (ref 3.87–5.11)
RDW: 12.5 % (ref 11.5–15.5)
WBC: 8.2 10*3/uL (ref 4.0–10.5)

## 2014-06-16 LAB — LACTATE DEHYDROGENASE: LDH: 443 U/L — ABNORMAL HIGH (ref 94–250)

## 2014-06-16 LAB — PROTIME-INR
INR: 1.22 (ref 0.00–1.49)
Prothrombin Time: 15.6 seconds — ABNORMAL HIGH (ref 11.6–15.2)

## 2014-06-16 LAB — GLUCOSE, CAPILLARY: Glucose-Capillary: 84 mg/dL (ref 70–99)

## 2014-06-16 LAB — APTT: aPTT: 33 seconds (ref 24–37)

## 2014-06-16 MED ORDER — SODIUM CHLORIDE 0.9 % IV SOLN
INTRAVENOUS | Status: DC
Start: 2014-06-16 — End: 2014-06-17
  Administered 2014-06-16: 19:00:00 via INTRAVENOUS

## 2014-06-16 MED ORDER — SODIUM CHLORIDE 0.9 % IV SOLN
INTRAVENOUS | Status: AC
  Administered 2014-06-16: 01:00:00 via INTRAVENOUS

## 2014-06-16 MED ORDER — HYDROMORPHONE HCL 1 MG/ML IJ SOLN
1.0000 mg | INTRAMUSCULAR | Status: AC | PRN
  Administered 2014-06-16: 1 mg via INTRAVENOUS
  Filled 2014-06-16: qty 1

## 2014-06-16 MED ORDER — ACETAMINOPHEN 650 MG RE SUPP: 650.0000 mg | Freq: Four times a day (QID) | RECTAL | Status: DC | PRN

## 2014-06-16 MED ORDER — ESCITALOPRAM OXALATE 20 MG PO TABS
20.0000 mg | ORAL_TABLET | Freq: Every day | ORAL | Status: DC
  Administered 2014-06-16: 20 mg via ORAL
  Filled 2014-06-16 (×2): qty 1

## 2014-06-16 MED ORDER — ONDANSETRON HCL 4 MG/2ML IJ SOLN
4.0000 mg | Freq: Three times a day (TID) | INTRAMUSCULAR | Status: DC | PRN
  Administered 2014-06-16 – 2014-06-17 (×2): 4 mg via INTRAVENOUS
  Filled 2014-06-16 (×4): qty 2

## 2014-06-16 MED ORDER — MAGNESIUM OXIDE 400 (241.3 MG) MG PO TABS
800.0000 mg | ORAL_TABLET | Freq: Two times a day (BID) | ORAL | Status: DC
  Administered 2014-06-16 (×3): 800 mg via ORAL
  Filled 2014-06-16 (×5): qty 2

## 2014-06-16 MED ORDER — MYCOPHENOLATE SODIUM 180 MG PO TBEC
360.0000 mg | DELAYED_RELEASE_TABLET | Freq: Two times a day (BID) | ORAL | Status: DC
  Administered 2014-06-16 (×3): 360 mg via ORAL
  Filled 2014-06-16 (×5): qty 2

## 2014-06-16 MED ORDER — OXYCODONE-ACETAMINOPHEN 5-325 MG PO TABS
2.0000 | ORAL_TABLET | Freq: Four times a day (QID) | ORAL | Status: DC | PRN
  Administered 2014-06-16 – 2014-06-17 (×3): 2 via ORAL
  Filled 2014-06-16 (×3): qty 2

## 2014-06-16 MED ORDER — ONDANSETRON HCL 4 MG/2ML IJ SOLN: 4.0000 mg | Freq: Three times a day (TID) | INTRAMUSCULAR | Status: AC | PRN

## 2014-06-16 MED ORDER — ASPIRIN EC 81 MG PO TBEC
81.0000 mg | DELAYED_RELEASE_TABLET | Freq: Every day | ORAL | Status: DC
  Administered 2014-06-16: 81 mg via ORAL
  Filled 2014-06-16 (×2): qty 1

## 2014-06-16 MED ORDER — LORATADINE 10 MG PO TABS
10.0000 mg | ORAL_TABLET | Freq: Every day | ORAL | Status: DC
  Administered 2014-06-16: 10 mg via ORAL
  Filled 2014-06-16 (×2): qty 1

## 2014-06-16 MED ORDER — PANTOPRAZOLE SODIUM 40 MG PO TBEC
40.0000 mg | DELAYED_RELEASE_TABLET | Freq: Every day | ORAL | Status: DC
  Administered 2014-06-16: 40 mg via ORAL
  Filled 2014-06-16 (×2): qty 1

## 2014-06-16 MED ORDER — ACETAMINOPHEN 325 MG PO TABS
650.0000 mg | ORAL_TABLET | Freq: Four times a day (QID) | ORAL | Status: DC | PRN
  Administered 2014-06-16: 650 mg via ORAL
  Filled 2014-06-16: qty 2

## 2014-06-16 MED ORDER — TACROLIMUS 1 MG PO CAPS
1.0000 mg | ORAL_CAPSULE | Freq: Two times a day (BID) | ORAL | Status: DC
  Administered 2014-06-16 (×3): 1 mg via ORAL
  Filled 2014-06-16 (×5): qty 1

## 2014-06-16 MED ORDER — K PHOS MONO-SOD PHOS DI & MONO 155-852-130 MG PO TABS
250.0000 mg | ORAL_TABLET | Freq: Two times a day (BID) | ORAL | Status: DC
  Administered 2014-06-16 (×3): 250 mg via ORAL
  Filled 2014-06-16 (×5): qty 1

## 2014-06-16 MED ORDER — ATORVASTATIN CALCIUM 40 MG PO TABS
40.0000 mg | ORAL_TABLET | Freq: Every day | ORAL | Status: DC
  Administered 2014-06-16: 40 mg via ORAL
  Filled 2014-06-16 (×2): qty 1

## 2014-06-16 MED ORDER — HEPARIN SODIUM (PORCINE) 5000 UNIT/ML IJ SOLN
5000.0000 [IU] | Freq: Three times a day (TID) | INTRAMUSCULAR | Status: DC
  Administered 2014-06-16 – 2014-06-17 (×5): 5000 [IU] via SUBCUTANEOUS
  Filled 2014-06-16 (×7): qty 1

## 2014-06-16 NOTE — Consult Note (Signed)
[place] [facility]  [phone]   HEMATOLOGY ONCOLOGY CONSULTATION   Erica Woodward  DOB: 01-16-1946  MR#: [id number]  CSN#: [id number]    Requesting Physician: [facility]    Patient Care Team: No Pcp Per Patient as PCP - General (General Practice)  History of present illness: Colon Cancer        68 y.o.  female admitted on 06/16/14 with a 3 day history of diffuse abdominal pain, worse at the left lower quadrant, nausea, increased abdominal girth and non-bloody emesis. She had diarrhea prior to admission, resolved upon hospitalization.  A CT of the abdomen and pelvis on 12/2 at the ED revealed  a circumferential mass-like thickening of distal small bowel in the right lower quadrant resulting in partial small bowel obstruction. Also seen, omental caking, presumed malignant perihepatic ascites (very small volume, unlikely to be sufficient for diagnostic sampling) and extensive caking throughout the pelvic peritoneal recesses. Findings are suspicious for peritoneal carcinomatosis. CT of the chest ordered to rule out occult malignancy. No tumor markers are available for review. No biopsy was obtained to date for tissue diagnosis.   She reports a 50 lb weight loss since February of this year. She denied any cardiac or respiratory complaints. No fever or chills, but she reports drenching night sweats over the last year, with increasing frequency. She denies any acute bleeding issues such as hematochezia, hematuria, hematemesis or vaginal bleeding. She has a history of renal transplant in 2005, but denies any dysuria or changes in urinary flow. No family history of colon cancer, but there is a history of possible ovarian cancer in sister. Denies risk factors for HIV or hepatitis. Denies Diabetes. She may have been exposed to carcinogenics in the past, as she worked in [employer]. She never smoked. Denies ETOH. No prior radiation. She is up-to-date with her  mammograms, Pap exams and screening studies including colonoscopy, the patient report were all normal. Her menopause began at age 65. Her menarche was at age 56. No complications during child birth.   We have been asked to see the patient in consultation with recommendations regarding her care  Past medical history:      Past Medical History  Diagnosis Date  . Renal disorder     kidney transplant 2005  . Hypertension   Gout GERD Hyperlipidemia Prior history of otitis media with ruptured tympanic membrane Depression  Past surgical history:      Past Surgical History  Procedure Laterality Date  . Kidney transplant From her daughter  2005     Medications:  Prior to Admission:  Prescriptions prior to admission  Medication Sig Dispense Refill Last Dose  . aspirin EC 81 MG tablet Take 81 mg by mouth daily.   06/15/2014 at Unknown time  . atorvastatin (LIPITOR) 40 MG tablet Take 40 mg by mouth daily.   06/15/2014 at Unknown time  . cetirizine (ZYRTEC) 10 MG tablet Take 10 mg by mouth at bedtime.   06/14/2014 at Unknown time  . escitalopram (LEXAPRO) 20 MG tablet Take 20 mg by mouth daily.   06/15/2014 at Unknown time  . esomeprazole (NEXIUM) 40 MG capsule Take 40 mg by mouth daily.   06/15/2014 at Unknown time  . magnesium oxide (MAG-OX) 400 MG tablet Take 800 mg by mouth 2 (two) times daily.   06/15/2014 at Unknown time  . mycophenolate (MYFORTIC) 180 MG EC tablet Take 360 mg by mouth 2 (two) times daily.   06/15/2014 at  Unknown time  . ondansetron (ZOFRAN ODT) 4 MG disintegrating tablet 4mg  ODT q4 hours prn nausea/vomit (Patient taking differently: Take 4 mg by mouth every 4 (four) hours as needed for nausea or vomiting. ) 8 tablet 0 unk  . phosphorus (K PHOS NEUTRAL) [phone] MG tablet Take 1 tablet by mouth 2 (two) times daily.   06/15/2014 at Unknown time  . tacrolimus (PROGRAF) 1 MG capsule Take 1 mg by mouth 2 (two) times daily.   06/15/2014 at Unknown time  . topiramate (TOPAMAX)  25 MG tablet Take 25 mg by mouth 2 (two) times daily as needed (migraine).    06/15/2014 at Unknown time    HQI:[id number] **OR** acetaminophen, HYDROmorphone (DILAUDID) injection, ondansetron (ZOFRAN) IV, ondansetron, oxyCODONE-acetaminophen  Allergies: No Known Allergies  Family history:     Family History  Problem Relation Age of Onset  . Kidney disease Father   . Lung cancer Brother   . Seizures Brother   . Seizures Sister   She may have 1 sister with ovarian cancer. No other malignancies in her family                                          Social history: The patient is married she has 4 daughters in good health. She lives in [place], [place]. She is retired from [employer] work.  ROS: Constitutional: Denies fevers, chills. She does have abnormal night sweats, on a daily basis Eyes: Denies blurriness of vision, double vision or watery eyes Ears, nose, mouth, throat, and face: Denies mucositis or sore throat Respiratory: Denies cough, dyspnea or wheezes Cardiovascular: Denies palpitation, chest discomfort or lower extremity swelling Gastrointestinal:  As in history of present illness Skin: Denies abnormal skin rashes Lymphatics: Denies new lymphadenopathy or easy bruising Neurological:Denies numbness, tingling or new weaknesses Behavioral/Psych: Mood is stable, no new changes  All other systems were reviewed with the patient and are negative.   Physical Exam    ECOG PERFORMANCE STATUS: 2  Filed Vitals:   06/16/14 0448  BP: 123/66  Pulse: 80  Temp: 98.3 F (36.8 C)  Resp: 16   Filed Weights   06/16/14 0038  Weight: 182 lb 1.6 oz (82.6 kg)    GENERAL:alert, no distress and comfortable SKIN: skin color, texture, turgor are normal, no rashes or significant lesions EYES: normal, conjunctiva are pink and non-injected, sclera clear OROPHARYNX:no exudate, no erythema and lips, buccal mucosa, and tongue normal. Edentulous NECK: supple, thyroid normal size,  non-tender, without nodularity LYMPH:  no palpable lymphadenopathy in the cervical, axillary or inguinal LUNGS: clear to auscultation and percussion with normal breathing effort HEART: regular rate & rhythm and no murmurs and no lower extremity edema ABDOMEN: abdomen soft, Tender to palpation in the right to medial lower quadrant, with an area of fullness just above the suprapubic region to the right  but no discrete mass, and normal bowel sounds Musculoskeletal:no cyanosis of digits and no clubbing  PSYCH: alert & oriented x 3 with fluent speech NEURO: no focal motor/sensory deficits   Lab results:       CBC  Recent Labs Lab 06/15/14 1738 06/15/14 1753 06/16/14 0148  WBC 6.7  --  8.2  HGB 12.4 12.9 11.6*  HCT 37.0 38.0 35.5*  PLT 328  --  300  MCV 94.1  --  92.0  MCH 31.6  --  30.1  MCHC 33.5  --  32.7  RDW 12.4  --  12.5  LYMPHSABS 1.5  --   --   MONOABS 0.6  --   --   EOSABS 0.1  --   --   BASOSABS 0.0  --   --     Anemia panel:  No results for input(s): VITAMINB12, FOLATE, FERRITIN, TIBC, IRON, RETICCTPCT in the last 72 hours.   Chemistries   Recent Labs Lab 06/15/14 1753 06/16/14 0148  NA 137 139  K 3.5* 3.8  CL 104 103  CO2  --  21  GLUCOSE 100* 91  BUN 8 9  CREATININE 1.10 0.94  CALCIUM  --  8.4     Coagulation profile  Recent Labs Lab 06/16/14 0148  INR 1.22    Urine Studies No results for input(s): UHGB, CRYS in the last 72 hours.  Invalid input(s): UACOL, UAPR, USPG, UPH, UTP, UGL, UKET, UBIL, UNIT, UROB, ULEU, UEPI, UWBC, URBC, UBAC, CAST, UCOM, BILUA  Studies:      Ct Abdomen Pelvis Wo Contrast 06/15/2014   FINDINGS: Lower Chest: The lung bases are clear. Visualized cardiac structures are within normal limits for size. No pericardial effusion. Small hiatal hernia.  Abdomen: Unenhanced CT was performed per clinician order. Lack of IV contrast limits sensitivity and specificity, especially for evaluation of abdominal/pelvic solid viscera.  Unremarkable CT appearance of the stomach, duodenum, spleen, adrenal glands and pancreas save for fatty atrophy of the neck, head and uncinate process. Stable segment 2 hepatic cyst. Liver contours are within normal limits. No new discrete hepatic lesion. Gallbladder is unremarkable. No intra or extrahepatic biliary ductal dilatation. Native kidneys are markedly atrophic.  Numerous loops of mildly dilated small bowel proximally bleeding up to a focal segment of diffusely abnormal small bowel with circumferential irregular wall thickening significantly narrowing the lumen in resulting in partial obstruction. 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 Diffuse soft tissue nodularity lining the peritoneal lining of the pelvis. The at next a and uterus are ill-defined. There is a 3.1 cm water attenuation cystic lesion associated with the right ovary. This measures enlarged compared to 1.9 cm previously.  Right lower quadrant renal transplant without evidence of hydronephrosis or acute abnormality.  Pelvis: Diffuse soft tissue caking throughout the peritoneal lining of the pelvis. The adnexa and uterus are nearly inseparable. There is a 3.1 cm water attenuation cystic lesion affiliated with the right adnexum which has enlarged compared to the 1.9 x 1.8 cm previously. Bilateral tubal ligation clips are present.  Bones/Soft Tissues: No acute fracture or aggressive appearing lytic or blastic osseous lesion. Lower lumbar facet arthropathy worse on the right than the left. Degenerative grade 1 anterolisthesis of L4 on L5. No significant interval progression compared to prior.  Vascular: Limited evaluation in the absence of intravenous contrast. Scattered atherosclerotic vascular calcifications without  aneurysmal dilatation.   IMPRESSION:  1. 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 Circumferential mass like thickening of distal small bowel in the right lower quadrant results in partial small bowel obstruction. 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 Differential considerations include primary small bowel lymphoma (given known renal transplant presumed longstanding immunosuppressive therapy) and ovarian adenocarcinoma. Unfortunately, I see no discrete lesion that would be  amenable to image guided percutaneous biopsy. The patient may require laparoscopic sampling for tissue diagnosis.  2. Interval enlargement of a water attenuation cystic lesion in the right adnexa. This may represent a primary ovarian malignancy or incidental enlargement of and otherwise benign ovarian cysts.  3. Small hiatal hernia. 4. Atrophic native kidneys.  5. Unremarkable right lower quadrant renal transplants.  6. Lower lumbar facet arthropathy and degenerative anterolisthesis of L4 on L5.  7. Atherosclerotic vascular calcifications.     Assessment/Plan:68 y.o. female  with    Likely peritoneal carcinomatosis versus other malignancy  A CT of the abdomen and pelvis on 12/2 revealed a circumferential mass-like thickening of distal small bowel in the right lower quadrant resulting in partial small bowel obstruction. Also seen, omental caking, presumed malignant perihepatic ascites (very small volume, unlikely to be sufficient for diagnostic sampling) and extensive caking throughout the pelvic peritoneal recesses. Findings are suspicious for peritoneal carcinomatosis. CT of the chest ordered to rule out occult malignancy. No tumor markers are available for review.  No biopsy was obtained to date for tissue diagnosis. Consider IR consult  for biopsy options Once diagnosis is obtain, will follow with recommendations regarding treatment options and prognosis. THank you for the consultation  Anemia Of chronic disease, dilution, malignancy and poor oral intake. No bleeding issues noted. No transfusion is indicated at this time  History of Renal transplant secondary to hypertension  Full Code  [facility] E, PA-C 06/16/2014   I have seen the patient, examined her. I agree with the assessment and and plan and have edited the notes.   68 year old female with past history of renal transplant, he presented with worsening intermittent abdominal pain, nausea vomiting, anorexia, and 40 pounds weight loss over the past 6-8 months. CT scan of abdomen and pelvis revealed diffuse omental caking, most consistent with pattern in carcinomatosis.Circumferential mass like thickening of distal small bowel in the right lower quadrant results in partial small bowel obstruction.   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 Although this is not typical presentation of lymphoma, giving her prior liver transplant history, lymphoma is also possible.  Recommendations #1 please consult GI for colonoscopy and EGD to ruled out GI malignancy #2 please consult general surgery for laparoscopic biopsy and surgical evaluation of partial small bowel obstruction. The radiologist who read the CT scan does not feel it is feasible to do a needle biopsy. If lymphoma is suspected, surgical biopsy is needed, and please also obtain fresh tissue for flow cytometry to ruled out lymphoma, in addition to regular biopsy sample preparation.  #3 vaginal ultrasound to evaluate endometrial and ovaries to ruled out GYN malignancy.  #4 please obtain the following labs: Beta hCG, CA-19-9. CEA and CA-125 has been ordered and results are pending.  I had a long conversation with patient and her children. I  gave them my contact information. If her biopsy result is still pending upon discharge, I asked patient's family member to call my office and I'll arrange appropriate follow-up for her, based on the final diagnosis of her malignancy.  Thank thanks for the consultation.  Truitt Merle  06/16/2014

## 2014-06-16 NOTE — Plan of Care (Signed)
Problem: Phase I Progression Outcomes Goal: Pain controlled with appropriate interventions Outcome: Progressing Goal: OOB as tolerated unless otherwise ordered Outcome: Progressing Goal: Initial discharge plan identified Outcome: Progressing Goal: Voiding-avoid urinary catheter unless indicated Outcome: Progressing  Problem: Phase II Progression Outcomes Goal: Progress activity as tolerated unless otherwise ordered Outcome: Progressing Goal: Discharge plan established Outcome: Progressing Goal: Vital signs remain stable Outcome: Progressing Goal: IV changed to normal saline lock Outcome: Progressing Goal: Obtain order to discontinue catheter if appropriate Outcome: Not Applicable Date Met:  78/97/84

## 2014-06-16 NOTE — Progress Notes (Signed)
[facility] PROGRESS NOTE  Erica Woodward SAY:[id number] DOB: 1946-04-20 DOA: 06/15/2014 PCP: No PCP Per Patient  Assessment/Plan: 68 y/o female with PMH of HTN, HPL, s/p Renal transplant who presented with abdominal pains and found to have ? CA   1. Abdominal pains likely due to underlying cancer probable lymphoma vs solid organ tumor (ovarian)   -CT: peritoneal carcinomatosis. Circumferential mass like thickening of distal small bowel in the right lower quadrant results in partial small bowel obstruction. -clinically better, no vomiting; start clears, cont pain control, antiemetics,  -obtain tumor markers, consult oncology; may need CT chest   2. HTN not meds; BP is stable, cont to monitor  3. HPL, cont statins     Code Status: full Family Communication: d/w patient, her daughter at the bedside (indicate person spoken with, relationship, and if by phone, the number) Disposition Plan: home pend clinical improvement    Consultants:  Oncology   Procedures:  none  Antibiotics:  None  (indicate start date, and stop date if known)  HPI/Subjective: Alert, denies pains  Objective: Filed Vitals:   06/16/14 0448  BP: 123/66  Pulse: 80  Temp: 98.3 F (36.8 C)  Resp: 16   No intake or output data in the 24 hours ending 06/16/14 1128 Filed Weights   06/16/14 0038  Weight: 82.6 kg (182 lb 1.6 oz)    Exam:   General:  Alert, oriented   Cardiovascular: s1,s2 rrr  Respiratory: CTA BL  Abdomen: soft, nt,nd   Musculoskeletal: no LE edema   Data Reviewed: Basic Metabolic Panel:  Recent Labs Lab 06/15/14 1753 06/16/14 0148  NA 137 139  K 3.5* 3.8  CL 104 103  CO2  --  21  GLUCOSE 100* 91  BUN 8 9  CREATININE 1.10 0.94  CALCIUM  --  8.4   Liver Function Tests:  Recent Labs Lab 06/16/14 0148  AST 16  ALT 6  ALKPHOS 66  BILITOT 0.6  PROT 5.9*  ALBUMIN 2.8*    Recent Labs Lab 06/15/14 1909  LIPASE 17   No results for input(s): AMMONIA  in the last 168 hours. CBC:  Recent Labs Lab 06/15/14 1738 06/15/14 1753 06/16/14 0148  WBC 6.7  --  8.2  NEUTROABS 4.6  --   --   HGB 12.4 12.9 11.6*  HCT 37.0 38.0 35.5*  MCV 94.1  --  92.0  PLT 328  --  300   Cardiac Enzymes: No results for input(s): CKTOTAL, CKMB, CKMBINDEX, TROPONINI in the last 168 hours. BNP (last 3 results) No results for input(s): PROBNP in the last 8760 hours. CBG:  Recent Labs Lab 06/16/14 0818  GLUCAP 84    No results found for this or any previous visit (from the past 240 hour(s)).   Studies: Ct Abdomen Pelvis Wo Contrast  06/15/2014   CLINICAL DATA:  68 year old female with 2 day history of left lower abdominal pain. History of kidney transplant 2005.  EXAM: CT ABDOMEN AND PELVIS WITHOUT CONTRAST  TECHNIQUE: Multidetector CT imaging of the abdomen and pelvis was performed following the standard protocol without IV contrast.  COMPARISON:  Prior CT abdomen/pelvis 04/12/2006  FINDINGS: Lower Chest: The lung bases are clear. Visualized cardiac structures are within normal limits for size. No pericardial effusion. Small hiatal hernia.  Abdomen: Unenhanced CT was performed per clinician order. Lack of IV contrast limits sensitivity and specificity, especially for evaluation of abdominal/pelvic solid viscera. Unremarkable CT appearance of the stomach, duodenum, spleen, adrenal glands and pancreas  save for fatty atrophy of the neck, head and uncinate process. Stable segment 2 hepatic cyst. Liver contours are within normal limits. No new discrete hepatic lesion. Gallbladder is unremarkable. No intra or extrahepatic biliary ductal dilatation. Native kidneys are markedly atrophic.  Numerous loops of mildly dilated small bowel proximally bleeding up to a focal segment of diffusely abnormal small bowel with circumferential irregular wall thickening significantly narrowing the lumen in resulting in partial obstruction. 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 Diffuse soft tissue nodularity lining the peritoneal lining of the pelvis. The at next a and uterus are ill-defined. There is a 3.1 cm water attenuation cystic lesion associated with the right ovary. This measures enlarged compared to 1.9 cm previously.  Right lower quadrant renal transplant without evidence of hydronephrosis or acute abnormality.  Pelvis: Diffuse soft tissue caking throughout the peritoneal lining of the pelvis. The adnexa and uterus are nearly inseparable. There is a 3.1 cm water attenuation cystic lesion affiliated with the right adnexum which has enlarged compared to the 1.9 x 1.8 cm previously. Bilateral tubal ligation clips are present.  Bones/Soft Tissues: No acute fracture or aggressive appearing lytic or blastic osseous lesion. Lower lumbar facet arthropathy worse on the right than the left. Degenerative grade 1 anterolisthesis of L4 on L5. No significant interval progression compared to prior.  Vascular: Limited evaluation in the absence of intravenous contrast. Scattered atherosclerotic vascular calcifications without aneurysmal dilatation.  IMPRESSION: 1. 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 Circumferential mass like thickening of distal small bowel in the right lower quadrant results in partial small bowel obstruction. 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 Differential considerations include primary small bowel lymphoma (given known renal transplant presumed longstanding immunosuppressive therapy) and ovarian adenocarcinoma. Unfortunately, I see no discrete lesion that would  be amenable to image guided percutaneous biopsy. The patient may require laparoscopic sampling for tissue diagnosis. 2. Interval enlargement of a water attenuation cystic lesion in the right adnexa. This may represent a primary ovarian malignancy or incidental enlargement of and otherwise benign ovarian cysts. 3. Small hiatal hernia. 4. Atrophic native kidneys. 5. Unremarkable right lower quadrant renal transplants. 6. Lower lumbar facet arthropathy and degenerative anterolisthesis of L4 on L5. 7. Atherosclerotic vascular calcifications.   Electronically Signed   By: Jacqulynn Cadet M.D.   On: 06/15/2014 23:10    Scheduled Meds: . sodium chloride   Intravenous STAT  . aspirin EC  81 mg Oral Daily  . atorvastatin  40 mg Oral Daily  . escitalopram  20 mg Oral Daily  . heparin  5,000 Units Subcutaneous 3 times per day  . loratadine  10 mg Oral Daily  . magnesium oxide  800 mg Oral BID  . mycophenolate  360 mg Oral BID  . pantoprazole  40 mg Oral Daily  . phosphorus  250 mg Oral BID  . tacrolimus  1 mg Oral BID   Continuous Infusions:   Principal Problem:   Abdominal pain Active Problems:   HLD (hyperlipidemia)   Essential hypertension   KIDNEY TRANSPLANTATION, HX OF   GERD (gastroesophageal reflux disease)    Time spent: >35 minutes     Kinnie Feil  [facility] Pager [phone].  If 7PM-7AM, please contact night-coverage at [email or web address], password [email or web address] 06/16/2014, 11:28 AM  LOS: 1 day

## 2014-06-16 NOTE — H&P (Signed)
[facility] History and Physical  [name] QVZ:[id number] DOB: [birth year] DOA: 06/15/2014  Referring physician: ED physician PCP: No PCP Per Patient  Specialists:   Chief Complaint: Abdominal pain  HPI: Erica Woodward is a 68 y.o. female past medical history of hypertension, kidney transplantation (11 years ago), hyperlipidemia, who presents with abdominal pain.  Patient reports that she started having abdominal pain 3 days ago, which is associated with nausea vomiting and diarrhea. Vomiting has improved, and his diarrhea has resolved. Her abdominal pain is diffuse, worse on the left lower quadrant, constant, nonradiating. Patient does not have a fever or chills, but has sweating. She has "bottom pressure feeding". No symptoms for UTI.  She denies fever, chills, headaches, cough, chest pain, SOB, dysuria, urgency, frequency, hematuria, skin rashes, joint pain or leg swelling.  In ED, CT-abd/pelvis findings are consistent with peritoneal carcinomatosis. Lipase negative. Patient is admitted to inpatient for further evaluation and treatment.  Review of Systems: As presented in the history of presenting illness, rest negative.  Where does patient live?  At home Can patient participate in ADLs? Yes  Allergy: No Known Allergies  Past Medical History  Diagnosis Date  . Renal disorder     kidney transplant 2005  . Hypertension     Past Surgical History  Procedure Laterality Date  . Kidney transplant      Social History:  reports that she has never smoked. She does not have any smokeless tobacco history on file. She reports that she does not drink alcohol. Her drug history is not on file.  Family History:  Family History  Problem Relation Age of Onset  . Kidney disease Father   . Lung cancer Brother   . Seizures Brother   . Seizures Sister      Prior to Admission medications   Medication Sig Start Date End Date Taking? Authorizing Provider  aspirin EC 81 MG  tablet Take 81 mg by mouth daily.   Yes Historical Provider, MD  atorvastatin (LIPITOR) 40 MG tablet Take 40 mg by mouth daily.   Yes Historical Provider, MD  cetirizine (ZYRTEC) 10 MG tablet Take 10 mg by mouth at bedtime.   Yes Historical Provider, MD  escitalopram (LEXAPRO) 20 MG tablet Take 20 mg by mouth daily.   Yes Historical Provider, MD  esomeprazole (NEXIUM) 40 MG capsule Take 40 mg by mouth daily.   Yes Historical Provider, MD  magnesium oxide (MAG-OX) 400 MG tablet Take 800 mg by mouth 2 (two) times daily.   Yes Historical Provider, MD  mycophenolate (MYFORTIC) 180 MG EC tablet Take 360 mg by mouth 2 (two) times daily.   Yes Historical Provider, MD  ondansetron (ZOFRAN ODT) 4 MG disintegrating tablet 4mg  ODT q4 hours prn nausea/vomit Patient taking differently: Take 4 mg by mouth every 4 (four) hours as needed for nausea or vomiting.  03/09/13  Yes Julianne Rice, MD  phosphorus (K PHOS NEUTRAL) [phone] MG tablet Take 1 tablet by mouth 2 (two) times daily.   Yes Historical Provider, MD  tacrolimus (PROGRAF) 1 MG capsule Take 1 mg by mouth 2 (two) times daily.   Yes Historical Provider, MD  topiramate (TOPAMAX) 25 MG tablet Take 25 mg by mouth 2 (two) times daily as needed (migraine).    Yes Historical Provider, MD    Physical Exam: Filed Vitals:   06/15/14 2330 06/16/14 0000 06/16/14 0008 06/16/14 0038  BP: 127/63 103/58 103/58 137/74  Pulse: 91  89 90  Temp:  98.2 F (36.8 C)  TempSrc:    Oral  Resp: 17 17 19 18   Height:    5\' 4"  (1.626 m)  Weight:    82.6 kg (182 lb 1.6 oz)  SpO2: 98% 98% 96% 96%   General: Not in acute distress HEENT:       Eyes: PERRL, EOMI, no scleral icterus       ENT: No discharge from the ears and nose, no pharynx injection, no tonsillar enlargement.        Neck: No JVD, no bruit, no mass felt. Cardiac: S1/S2, RRR, No murmurs, No gallops or rubs Pulm: Good air movement bilaterally. Clear to auscultation bilaterally. No rales, wheezing,  rhonchi or rubs. Abd: Soft, nondistended, diffused tenderness, worse on the LLQ, no rebound pain, no organomegaly, BS present Ext: No edema bilaterally. 2+DP/PT pulse bilaterally Musculoskeletal: No joint deformities, erythema, or stiffness, ROM full Skin: No rashes.  Neuro: Alert and oriented X3, cranial nerves II-XII grossly intact, muscle strength 5/5 in all extremeties, sensation to light touch intact. Moves all extremities. Psych: Patient is not psychotic, no suicidal or hemocidal ideation.  Labs on Admission:  Basic Metabolic Panel:  Recent Labs Lab 06/15/14 1753 06/16/14 0148  NA 137 139  K 3.5* 3.8  CL 104 103  CO2  --  21  GLUCOSE 100* 91  BUN 8 9  CREATININE 1.10 0.94  CALCIUM  --  8.4   Liver Function Tests:  Recent Labs Lab 06/16/14 0148  AST 16  ALT 6  ALKPHOS 66  BILITOT 0.6  PROT 5.9*  ALBUMIN 2.8*    Recent Labs Lab 06/15/14 1909  LIPASE 17   No results for input(s): AMMONIA in the last 168 hours. CBC:  Recent Labs Lab 06/15/14 1738 06/15/14 1753 06/16/14 0148  WBC 6.7  --  8.2  NEUTROABS 4.6  --   --   HGB 12.4 12.9 11.6*  HCT 37.0 38.0 35.5*  MCV 94.1  --  92.0  PLT 328  --  300   Cardiac Enzymes: No results for input(s): CKTOTAL, CKMB, CKMBINDEX, TROPONINI in the last 168 hours.  BNP (last 3 results) No results for input(s): PROBNP in the last 8760 hours. CBG: No results for input(s): GLUCAP in the last 168 hours.  Radiological Exams on Admission: Ct Abdomen Pelvis Wo Contrast  06/15/2014   CLINICAL DATA:  68 year old female with 2 day history of left lower abdominal pain. History of kidney transplant 2005.  EXAM: CT ABDOMEN AND PELVIS WITHOUT CONTRAST  TECHNIQUE: Multidetector CT imaging of the abdomen and pelvis was performed following the standard protocol without IV contrast.  COMPARISON:  Prior CT abdomen/pelvis 04/12/2006  FINDINGS: Lower Chest: The lung bases are clear. Visualized cardiac structures are within normal limits  for size. No pericardial effusion. Small hiatal hernia.  Abdomen: Unenhanced CT was performed per clinician order. Lack of IV contrast limits sensitivity and specificity, especially for evaluation of abdominal/pelvic solid viscera. Unremarkable CT appearance of the stomach, duodenum, spleen, adrenal glands and pancreas save for fatty atrophy of the neck, head and uncinate process. Stable segment 2 hepatic cyst. Liver contours are within normal limits. No new discrete hepatic lesion. Gallbladder is unremarkable. No intra or extrahepatic biliary ductal dilatation. Native kidneys are markedly atrophic.  Numerous loops of mildly dilated small bowel proximally bleeding up to a focal segment of diffusely abnormal small bowel with circumferential irregular wall thickening significantly narrowing the lumen in resulting in partial obstruction. 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 Diffuse soft tissue nodularity lining the peritoneal lining of the pelvis. The at next a and uterus are ill-defined. There is a 3.1 cm water attenuation cystic lesion associated with the right ovary. This measures enlarged compared to 1.9 cm previously.  Right lower quadrant renal transplant without evidence of hydronephrosis or acute abnormality.  Pelvis: Diffuse soft tissue caking throughout the peritoneal lining of the pelvis. The adnexa and uterus are nearly inseparable. There is a 3.1 cm water attenuation cystic lesion affiliated with the right adnexum which has enlarged compared to the 1.9 x 1.8 cm previously. Bilateral tubal ligation clips are present.  Bones/Soft Tissues: No acute fracture or aggressive appearing lytic or blastic osseous lesion. Lower lumbar facet arthropathy worse on the right than the left.  Degenerative grade 1 anterolisthesis of L4 on L5. No significant interval progression compared to prior.  Vascular: Limited evaluation in the absence of intravenous contrast. Scattered atherosclerotic vascular calcifications without aneurysmal dilatation.  IMPRESSION: 1. 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 Circumferential mass like thickening of distal small bowel in the right lower quadrant results in partial small bowel obstruction. 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 Differential considerations include primary small bowel lymphoma (given known renal transplant presumed longstanding immunosuppressive therapy) and ovarian adenocarcinoma. Unfortunately, I see no discrete lesion that would be amenable to image guided percutaneous biopsy. The patient may require laparoscopic sampling for tissue diagnosis. 2. Interval enlargement of a water attenuation cystic lesion in the right adnexa. This may represent a primary ovarian malignancy or incidental enlargement of and otherwise benign ovarian cysts. 3. Small hiatal hernia. 4. Atrophic native kidneys. 5. Unremarkable right lower quadrant renal transplants. 6. Lower lumbar facet arthropathy and degenerative anterolisthesis of L4 on L5. 7. Atherosclerotic vascular calcifications.   Electronically Signed   By: Jacqulynn Cadet M.D.   On: 06/15/2014 23:10    Assessment/Plan Principal Problem:   Abdominal pain Active Problems:   HLD (hyperlipidemia)   Essential hypertension   KIDNEY TRANSPLANTATION, HX OF   GERD (gastroesophageal reflux disease)  Abdominal pain: CT findings are consistent with carcinomatosis. Possible etiologies are ovarian cancers or lymphoma given she is taking in immunosuppressants. -will admit to med-surg bed -Symptomatic treatment: Zofran for  nausea and Percocet for pain -Check LDH and ca-125 -Check an INR/PTT -May consult to oncology in AM -Nothing by mouth -IV fluid: Normal saline 100 cc per hour  History of kidney transplantation: On mycophenolate and tacrolimus at home. Kidney function is good. -Continue home medications -follow up renal function by BMP  GERD: Protonix  Hyperlipidemia: No lipid profile on record. -Continue Lipitor -Check FLP   DVT ppx: SQ Heparin    Code Status: Full code (patient is not ready to answer this question). Family Communication:   Yes, patient's   daugher  at bed side Disposition Plan: Admit to inpatient   Date of Service 06/16/2014    Ivor Costa [facility] Pager [phone]  If 7PM-7AM, please contact night-coverage [email or web address] Password [email or web address] 06/16/2014, 3:20 AM

## 2014-06-17 ENCOUNTER — Encounter (HOSPITAL_COMMUNITY): Payer: Self-pay | Admitting: General Surgery

## 2014-06-17 DIAGNOSIS — I10 Essential (primary) hypertension: Secondary | ICD-10-CM

## 2014-06-17 DIAGNOSIS — E785 Hyperlipidemia, unspecified: Secondary | ICD-10-CM

## 2014-06-17 LAB — URINE CULTURE

## 2014-06-17 LAB — CA 125: CA 125: 300 U/mL — ABNORMAL HIGH (ref <35)

## 2014-06-17 LAB — GLUCOSE, CAPILLARY: Glucose-Capillary: 79 mg/dL (ref 70–99)

## 2014-06-17 LAB — CEA

## 2014-06-17 MED ORDER — ONDANSETRON HCL 4 MG/2ML IJ SOLN: 4.0000 mg | Freq: Three times a day (TID) | INTRAMUSCULAR | Status: DC | PRN

## 2014-06-17 MED ORDER — ACETAMINOPHEN 325 MG PO TABS
650.0000 mg | ORAL_TABLET | Freq: Four times a day (QID) | ORAL | Status: DC | PRN
Start: 2014-06-17 — End: 2016-04-09

## 2014-06-17 MED ORDER — OXYCODONE-ACETAMINOPHEN 5-325 MG PO TABS: 2.0000 | ORAL_TABLET | Freq: Four times a day (QID) | ORAL | Status: DC | PRN

## 2014-06-17 MED ORDER — ONDANSETRON HCL 4 MG/2ML IJ SOLN: 4.0000 mg | Freq: Four times a day (QID) | INTRAMUSCULAR | Status: DC | PRN

## 2014-06-17 MED ORDER — ONDANSETRON 8 MG/NS 50 ML IVPB
8.0000 mg | Freq: Once | INTRAVENOUS | Status: AC
  Administered 2014-06-17: 8 mg via INTRAVENOUS
  Filled 2014-06-17: qty 8

## 2014-06-17 NOTE — Discharge Summary (Signed)
Physician Discharge Summary  Erica Woodward GQQ:[id number] DOB: 1945-11-01 DOA: 06/15/2014  PCP: No PCP Per Patient  Admit date: 06/15/2014 Discharge date: 06/17/2014  Time spent: >35 minutes  Recommendations for Outpatient Follow-up:  TF to [facility] hospital   Discharge Diagnoses:  Principal Problem:   Abdominal pain Active Problems:   HLD (hyperlipidemia)   Essential hypertension   KIDNEY TRANSPLANTATION, HX OF   GERD (gastroesophageal reflux disease)   Discharge Condition: stable   Diet recommendation:   Filed Weights   06/16/14 0038  Weight: 82.6 kg (182 lb 1.6 oz)    History of present illness:  67 y/o female with PMH of HTN, HPL, s/p Renal transplant who presented with abdominal pains and found to have ? CA; (h/o weight loss > 40pounds)  -patient reported history of diffuse abdominal pain, worse at the left lower quadrant, nausea, increased abdominal girth and non-bloody emesis. She had diarrhea prior to admission, resolved upon hospitalization   Hospital Course:  1. Abdominal pains likely due to underlying cancer probable lymphoma vs solid organ tumor (ovarian)   -CT: peritoneal carcinomatosis. Circumferential mass like thickening of distal small bowel in the right lower quadrant results in partial small bowel obstruction. CT chest WO contrast : no mets  -clinically better, no vomiting; cont pain control, antiemetics, IVF as needed  -CA 125-300, CEA<0.5; per surgery, oncology discussion and recommendation will plan to TF to [facility] for gyn/onc eval;   2. HTN not meds; BP is stable, cont to monitor  3. HPL, cont statins     Called [facility] d/w Dr. Noralee Chars (oby/onc) who kindly accepted the patient for further eval, management   patient is being TF to [facility] when bed is available    Procedures:  none (i.e. Studies not automatically included, echos, thoracentesis, etc; not x-rays)  Consultations:  Surgery, IR, oncology   Discharge  Exam: Filed Vitals:   06/17/14 0602  BP: 104/62  Pulse: 80  Temp: 97.8 F (36.6 C)  Resp: 18    General: alert Cardiovascular: s1,s2 rrr Respiratory: CTA BL  Discharge Instructions  Discharge Instructions    Discharge instructions    Complete by:  As directed   Follow up for further care evaluation, management at [facility]     Increase activity slowly    Complete by:  As directed             Medication List    STOP taking these medications        aspirin EC 81 MG tablet     ondansetron 4 MG disintegrating tablet  Commonly known as:  ZOFRAN ODT      TAKE these medications        acetaminophen 325 MG tablet  Commonly known as:  TYLENOL  Take 2 tablets (650 mg total) by mouth every 6 (six) hours as needed for mild pain (or Fever >/= 101).     atorvastatin 40 MG tablet  Commonly known as:  LIPITOR  Take 40 mg by mouth daily.     cetirizine 10 MG tablet  Commonly known as:  ZYRTEC  Take 10 mg by mouth at bedtime.     escitalopram 20 MG tablet  Commonly known as:  LEXAPRO  Take 20 mg by mouth daily.     esomeprazole 40 MG capsule  Commonly known as:  NEXIUM  Take 40 mg by mouth daily.     magnesium oxide 400 MG tablet  Commonly known as:  MAG-OX  Take 800  mg by mouth 2 (two) times daily.     mycophenolate 180 MG EC tablet  Commonly known as:  MYFORTIC  Take 360 mg by mouth 2 (two) times daily.     ondansetron 4 MG/2ML Soln injection  Commonly known as:  ZOFRAN  Inject 2 mLs (4 mg total) into the vein every 8 (eight) hours as needed for nausea or vomiting.     oxyCODONE-acetaminophen 5-325 MG per tablet  Commonly known as:  PERCOCET/ROXICET  Take 2 tablets by mouth every 6 (six) hours as needed for severe pain.     phosphorus 155-852-130 MG tablet  Commonly known as:  K PHOS NEUTRAL  Take 1 tablet by mouth 2 (two) times daily.     tacrolimus 1 MG capsule  Commonly known as:  PROGRAF  Take 1 mg by mouth 2 (two) times daily.     topiramate  25 MG tablet  Commonly known as:  TOPAMAX  Take 25 mg by mouth 2 (two) times daily as needed (migraine).       No Known Allergies    The results of significant diagnostics from this hospitalization (including imaging, microbiology, ancillary and laboratory) are listed below for reference.    Significant Diagnostic Studies: Ct Abdomen Pelvis Wo Contrast  06/15/2014   CLINICAL DATA:  68 year old female with 2 day history of left lower abdominal pain. History of kidney transplant 2005.  EXAM: CT ABDOMEN AND PELVIS WITHOUT CONTRAST  TECHNIQUE: Multidetector CT imaging of the abdomen and pelvis was performed following the standard protocol without IV contrast.  COMPARISON:  Prior CT abdomen/pelvis 04/12/2006  FINDINGS: Lower Chest: The lung bases are clear. Visualized cardiac structures are within normal limits for size. No pericardial effusion. Small hiatal hernia.  Abdomen: Unenhanced CT was performed per clinician order. Lack of IV contrast limits sensitivity and specificity, especially for evaluation of abdominal/pelvic solid viscera. Unremarkable CT appearance of the stomach, duodenum, spleen, adrenal glands and pancreas save for fatty atrophy of the neck, head and uncinate process. Stable segment 2 hepatic cyst. Liver contours are within normal limits. No new discrete hepatic lesion. Gallbladder is unremarkable. No intra or extrahepatic biliary ductal dilatation. Native kidneys are markedly atrophic.  Numerous loops of mildly dilated small bowel proximally bleeding up to a focal segment of diffusely abnormal small bowel with circumferential irregular wall thickening significantly narrowing the lumen in resulting in partial obstruction. 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  Diffuse soft tissue nodularity lining the peritoneal lining of the pelvis. The at next a and uterus are ill-defined. There is a 3.1 cm water attenuation cystic lesion associated with the right ovary. This measures enlarged compared to 1.9 cm previously.  Right lower quadrant renal transplant without evidence of hydronephrosis or acute abnormality.  Pelvis: Diffuse soft tissue caking throughout the peritoneal lining of the pelvis. The adnexa and uterus are nearly inseparable. There is a 3.1 cm water attenuation cystic lesion affiliated with the right adnexum which has enlarged compared to the 1.9 x 1.8 cm previously. Bilateral tubal ligation clips are present.  Bones/Soft Tissues: No acute fracture or aggressive appearing lytic or blastic osseous lesion. Lower lumbar facet arthropathy worse on the right than the left. Degenerative grade 1 anterolisthesis of L4 on L5. No significant interval progression compared to prior.  Vascular: Limited evaluation in the absence of  intravenous contrast. Scattered atherosclerotic vascular calcifications without aneurysmal dilatation.  IMPRESSION: 1. 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 Circumferential mass like thickening of distal small bowel in the right lower quadrant results in partial small bowel obstruction. 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 Differential considerations include primary small bowel lymphoma (given known renal transplant presumed longstanding immunosuppressive therapy) and ovarian adenocarcinoma. Unfortunately, I see no discrete lesion that would be amenable to image guided percutaneous biopsy. The patient may require laparoscopic sampling for tissue diagnosis. 2. Interval enlargement of a water attenuation cystic lesion in the right adnexa. This may represent a  primary ovarian malignancy or incidental enlargement of and otherwise benign ovarian cysts. 3. Small hiatal hernia. 4. Atrophic native kidneys. 5. Unremarkable right lower quadrant renal transplants. 6. Lower lumbar facet arthropathy and degenerative anterolisthesis of L4 on L5. 7. Atherosclerotic vascular calcifications.   Electronically Signed   By: Jacqulynn Cadet M.D.   On: 06/15/2014 23:10   Ct Chest Wo Contrast  06/16/2014   CLINICAL DATA:  Cough.  Lymphoma.  Abdominal carcinomatosis.  EXAM: CT CHEST WITHOUT CONTRAST  TECHNIQUE: Multidetector CT imaging of the chest was performed following the standard protocol without IV contrast.  COMPARISON:  None.  FINDINGS: No abnormal mediastinal adenopathy. Tiny amount of pericardial fluid adjacent to the left ventricle.  Hiatal hernia contains adipose tissue and ascites.  Calcified granuloma at the left apex. Subsegmental atelectasis at the left base with volume loss and shift of the mediastinum to the left. Mild subsegmental atelectasis at the right lung base.  No destructive bone lesion.  Tiny central T6-T7 disc herniation.  No pneumothorax.  Ascites in the abdomen.  IMPRESSION: Bibasilar atelectasis left greater than right. No evidence of metastatic disease.   Electronically Signed   By: Maryclare Bean M.D.   On: 06/16/2014 20:11    Microbiology: No results found for this or any previous visit (from the past 240 hour(s)).   Labs: Basic Metabolic Panel:  Recent Labs Lab 06/15/14 1753 06/16/14 0148  NA 137 139  K 3.5* 3.8  CL 104 103  CO2  --  21  GLUCOSE 100* 91  BUN 8 9  CREATININE 1.10 0.94  CALCIUM  --  8.4   Liver Function Tests:  Recent Labs Lab 06/16/14 0148  AST 16  ALT 6  ALKPHOS 66  BILITOT 0.6  PROT 5.9*  ALBUMIN 2.8*    Recent Labs Lab 06/15/14 1909  LIPASE 17   No results for input(s): AMMONIA in the last 168 hours. CBC:  Recent Labs Lab 06/15/14 1738 06/15/14 1753 06/16/14 0148  WBC 6.7  --  8.2  NEUTROABS  4.6  --   --   HGB 12.4 12.9 11.6*  HCT 37.0 38.0 35.5*  MCV 94.1  --  92.0  PLT 328  --  300   Cardiac Enzymes: No results for input(s): CKTOTAL, CKMB, CKMBINDEX, TROPONINI in the last 168 hours. BNP: BNP (last 3 results) No results for input(s): PROBNP in the last 8760 hours. CBG:  Recent Labs Lab 06/16/14 0818 06/17/14 0809  GLUCAP 84 79       Signed:  Kawanna Christley N  [facility] 06/17/2014, 11:27 AM

## 2014-06-17 NOTE — Progress Notes (Signed)
[facility] PROGRESS NOTE  Erica Woodward IRC:[id number] DOB: Jun 02, 1946 DOA: 06/15/2014 PCP: No PCP Per Patient  Assessment/Plan: 68 y/o female with PMH of HTN, HPL, s/p Renal transplant who presented with abdominal pains and found to have ? CA; (h/o weight loss > 40pounds)   1. Abdominal pains likely due to underlying cancer probable lymphoma vs solid organ tumor (ovarian)   -CT: peritoneal carcinomatosis. Circumferential mass like thickening of distal small bowel in the right lower quadrant results in partial small bowel obstruction. CT chest WO contrast : no mets  -clinically better, no vomiting; cont pain control, antiemetics, IVF -CA 125-300, CEA<0.5; per surgery recommendation will plan to TF to [facility] for gyn/onc eval;   2. HTN not meds; BP is stable, cont to monitor  3. HPL, cont statins      Called [facility] d/w Dr. Noralee Chars (oby/onc) who kindly accepted the patient for further eval, management  Plan to TF to [facility] when bed is available   Code Status: full Family Communication: d/w patient, her daughter at the bedside (indicate person spoken with, relationship, and if by phone, the number) Disposition Plan: home pend clinical improvement    Consultants:  Oncology   Procedures:  none  Antibiotics:  None  (indicate start date, and stop date if known)  HPI/Subjective: Alert, denies pains  Objective: Filed Vitals:   06/17/14 0602  BP: 104/62  Pulse: 80  Temp: 97.8 F (36.6 C)  Resp: 18   No intake or output data in the 24 hours ending 06/17/14 1046 Filed Weights   06/16/14 0038  Weight: 82.6 kg (182 lb 1.6 oz)    Exam:   General:  Alert, oriented   Cardiovascular: s1,s2 rrr  Respiratory: CTA BL  Abdomen: soft, nt,nd   Musculoskeletal: no LE edema   Data Reviewed: Basic Metabolic Panel:  Recent Labs Lab 06/15/14 1753 06/16/14 0148  NA 137 139  K 3.5* 3.8  CL 104 103  CO2  --  21  GLUCOSE 100* 91  BUN 8 9   CREATININE 1.10 0.94  CALCIUM  --  8.4   Liver Function Tests:  Recent Labs Lab 06/16/14 0148  AST 16  ALT 6  ALKPHOS 66  BILITOT 0.6  PROT 5.9*  ALBUMIN 2.8*    Recent Labs Lab 06/15/14 1909  LIPASE 17   No results for input(s): AMMONIA in the last 168 hours. CBC:  Recent Labs Lab 06/15/14 1738 06/15/14 1753 06/16/14 0148  WBC 6.7  --  8.2  NEUTROABS 4.6  --   --   HGB 12.4 12.9 11.6*  HCT 37.0 38.0 35.5*  MCV 94.1  --  92.0  PLT 328  --  300   Cardiac Enzymes: No results for input(s): CKTOTAL, CKMB, CKMBINDEX, TROPONINI in the last 168 hours. BNP (last 3 results) No results for input(s): PROBNP in the last 8760 hours. CBG:  Recent Labs Lab 06/16/14 0818 06/17/14 0809  GLUCAP 84 79    No results found for this or any previous visit (from the past 240 hour(s)).   Studies: Ct Abdomen Pelvis Wo Contrast  06/15/2014   CLINICAL DATA:  68 year old female with 2 day history of left lower abdominal pain. History of kidney transplant 2005.  EXAM: CT ABDOMEN AND PELVIS WITHOUT CONTRAST  TECHNIQUE: Multidetector CT imaging of the abdomen and pelvis was performed following the standard protocol without IV contrast.  COMPARISON:  Prior CT abdomen/pelvis 04/12/2006  FINDINGS: Lower Chest: The lung bases are clear.  Visualized cardiac structures are within normal limits for size. No pericardial effusion. Small hiatal hernia.  Abdomen: Unenhanced CT was performed per clinician order. Lack of IV contrast limits sensitivity and specificity, especially for evaluation of abdominal/pelvic solid viscera. Unremarkable CT appearance of the stomach, duodenum, spleen, adrenal glands and pancreas save for fatty atrophy of the neck, head and uncinate process. Stable segment 2 hepatic cyst. Liver contours are within normal limits. No new discrete hepatic lesion. Gallbladder is unremarkable. No intra or extrahepatic biliary ductal dilatation. Native kidneys are markedly atrophic.  Numerous  loops of mildly dilated small bowel proximally bleeding up to a focal segment of diffusely abnormal small bowel with circumferential irregular wall thickening significantly narrowing the lumen in resulting in partial obstruction. 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 Diffuse soft tissue nodularity lining the peritoneal lining of the pelvis. The at next a and uterus are ill-defined. There is a 3.1 cm water attenuation cystic lesion associated with the right ovary. This measures enlarged compared to 1.9 cm previously.  Right lower quadrant renal transplant without evidence of hydronephrosis or acute abnormality.  Pelvis: Diffuse soft tissue caking throughout the peritoneal lining of the pelvis. The adnexa and uterus are nearly inseparable. There is a 3.1 cm water attenuation cystic lesion affiliated with the right adnexum which has enlarged compared to the 1.9 x 1.8 cm previously. Bilateral tubal ligation clips are present.  Bones/Soft Tissues: No acute fracture or aggressive appearing lytic or blastic osseous lesion. Lower lumbar facet arthropathy worse on the right than the left. Degenerative grade 1 anterolisthesis of L4 on L5. No significant interval progression compared to prior.  Vascular: Limited evaluation in the absence of intravenous contrast. Scattered atherosclerotic vascular calcifications without aneurysmal dilatation.  IMPRESSION: 1. 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 Circumferential mass like thickening of distal small bowel in the right lower quadrant results in partial small bowel obstruction. 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 Differential considerations include primary small bowel lymphoma (given known renal transplant presumed longstanding immunosuppressive therapy) and ovarian adenocarcinoma. Unfortunately, I see no discrete lesion that would be amenable to image guided percutaneous biopsy. The patient may require laparoscopic sampling for tissue diagnosis. 2. Interval enlargement of a water attenuation cystic lesion in the right adnexa. This may represent a primary ovarian malignancy or incidental enlargement of and otherwise benign ovarian cysts. 3. Small hiatal hernia. 4. Atrophic native kidneys. 5. Unremarkable right lower quadrant renal transplants. 6. Lower lumbar facet arthropathy and degenerative anterolisthesis of L4 on L5. 7. Atherosclerotic vascular calcifications.   Electronically Signed   By: Jacqulynn Cadet M.D.   On: 06/15/2014 23:10   Ct Chest Wo Contrast  06/16/2014   CLINICAL DATA:  Cough.  Lymphoma.  Abdominal carcinomatosis.  EXAM: CT CHEST WITHOUT CONTRAST  TECHNIQUE: Multidetector CT imaging of the chest was performed following the standard protocol without IV contrast.  COMPARISON:  None.  FINDINGS: No abnormal mediastinal adenopathy. Tiny amount of pericardial fluid adjacent to the left ventricle.  Hiatal hernia contains adipose tissue and ascites.  Calcified granuloma at the left apex. Subsegmental atelectasis at the left base with volume loss and shift of the mediastinum to the left. Mild subsegmental atelectasis at the  right lung base.  No destructive bone lesion.  Tiny central T6-T7 disc herniation.  No pneumothorax.  Ascites in the abdomen.  IMPRESSION: Bibasilar atelectasis left greater than right. No evidence of metastatic disease.   Electronically Signed   By: Maryclare Bean M.D.   On: 06/16/2014 20:11    Scheduled Meds: . aspirin EC  81 mg Oral Daily  . atorvastatin  40 mg Oral Daily  . escitalopram  20 mg Oral Daily  . heparin  5,000  Units Subcutaneous 3 times per day  . loratadine  10 mg Oral Daily  . magnesium oxide  800 mg Oral BID  . mycophenolate  360 mg Oral BID  . pantoprazole  40 mg Oral Daily  . phosphorus  250 mg Oral BID  . tacrolimus  1 mg Oral BID   Continuous Infusions: . sodium chloride 75 mL/hr at 06/16/14 1921    Principal Problem:   Abdominal pain Active Problems:   HLD (hyperlipidemia)   Essential hypertension   KIDNEY TRANSPLANTATION, HX OF   GERD (gastroesophageal reflux disease)    Time spent: >35 minutes     Kinnie Feil  [facility] Pager [phone]. If 7PM-7AM, please contact night-coverage at [email or web address], password [facility] 06/17/2014, 10:46 AM  LOS: 2 days

## 2014-06-17 NOTE — Care Management Note (Signed)
  Page 1 of 1   06/17/2014     10:56:38 AM CARE MANAGEMENT NOTE 06/17/2014  Patient:  Erica Woodward, Erica Woodward   Account Number:  [id number]  Date Initiated:  06/17/2014  Documentation initiated by:  Magdalen Spatz  Subjective/Objective Assessment:     Action/Plan:   Anticipated DC Date:  06/17/2014   Anticipated DC Plan:  ACUTE TO ACUTE TRANS         Choice offered to / List presented to:             Status of service:  Completed, signed off Medicare Important Message given?  YES (If response is "NO", the following Medicare IM given date fields will be blank) Date Medicare IM given:  06/17/2014 Medicare IM given by:  Magdalen Spatz Date Additional Medicare IM given:   Additional Medicare IM given by:    Discharge Disposition:    Per UR Regulation:    If discussed at Long Length of Stay Meetings, dates discussed:    Comments:

## 2014-06-17 NOTE — Consult Note (Signed)
Erica Woodward 1946-04-28  [id number].   Requesting MD: Dr. Truitt Merle Chief Complaint/Reason for Consult: peritoneal carcinomatosis HPI: This is a 68 yo white female who began having abdominal pain about 3 days ago.  She has noticed decrease in her appetite for the last several months.  Her daughters states she has lost about 40-50lbs in the last year.  She developed nausea with some emesis after eating for the last week as well.  The last solid BM she had was one week ago.  Since then, she has passed some flatus as well as some liquid stool.  She presented to [facility] where she had a CT scan with concern for peritoneal carcinomatosis from either lymphoma or ovarian cancer.  We have been asked to see the patient for biopsy need.  ROS : Please see HPI, otherwise negative  Family History  Problem Relation Age of Onset  . Kidney disease Father   . Lung cancer Brother   . Seizures Brother   . Seizures Sister     Past Medical History  Diagnosis Date  . Renal disorder     kidney transplant 2005  . Hypertension     Past Surgical History  Procedure Laterality Date  . Kidney transplant      Social History:  reports that she has never smoked. She does not have any smokeless tobacco history on file. She reports that she does not drink alcohol. Her drug history is not on file.  Allergies: No Known Allergies  Medications Prior to Admission  Medication Sig Dispense Refill  . aspirin EC 81 MG tablet Take 81 mg by mouth daily.    Marland Kitchen atorvastatin (LIPITOR) 40 MG tablet Take 40 mg by mouth daily.    . cetirizine (ZYRTEC) 10 MG tablet Take 10 mg by mouth at bedtime.    Marland Kitchen escitalopram (LEXAPRO) 20 MG tablet Take 20 mg by mouth daily.    Marland Kitchen esomeprazole (NEXIUM) 40 MG capsule Take 40 mg by mouth daily.    . magnesium oxide (MAG-OX) 400 MG tablet Take 800 mg by mouth 2 (two) times daily.    . mycophenolate (MYFORTIC) 180 MG EC tablet Take 360 mg by mouth 2 (two) times daily.    . ondansetron (ZOFRAN  ODT) 4 MG disintegrating tablet 76m ODT q4 hours prn nausea/vomit (Patient taking differently: Take 4 mg by mouth every 4 (four) hours as needed for nausea or vomiting. ) 8 tablet 0  . phosphorus (K PHOS NEUTRAL) 155-852-130 MG tablet Take 1 tablet by mouth 2 (two) times daily.    . tacrolimus (PROGRAF) 1 MG capsule Take 1 mg by mouth 2 (two) times daily.    .Marland Kitchentopiramate (TOPAMAX) 25 MG tablet Take 25 mg by mouth 2 (two) times daily as needed (migraine).       Blood pressure 104/62, pulse 80, temperature 97.8 F (36.6 C), temperature source Oral, resp. rate 18, height '5\' 4"'  (1.626 m), weight 182 lb 1.6 oz (82.6 kg), SpO2 91 %. Physical Exam: General: pleasant, WD, WN white female who is laying in bed in NAD HEENT: head is normocephalic, atraumatic.  Sclera are noninjected.  PERRL.  Ears and nose without any masses or lesions.  Mouth is pink and moist Heart: regular, rate, and rhythm.  Normal s1,s2. No obvious murmurs, gallops, or rubs noted.  Palpable radial and pedal pulses bilaterally Lungs: CTAB, no wheezes, rhonchi, or rales noted.  Respiratory effort nonlabored Abd: soft, diffuse mild tenderness, greatest in the lower abdomen and left  side, ND, +BS, no hernias, or organomegaly.  No discrete mass is palpable, but hardness is palpated throughout the lower abdomen particularly on the left side.   MS: all 4 extremities are symmetrical with no cyanosis, clubbing, or edema. Skin: warm and dry with no masses, lesions, or rashes Psych: A&Ox3 with an appropriate affect.    Results for orders placed or performed during the hospital encounter of 06/15/14 (from the past 48 hour(s))  CBC with Differential     Status: None   Collection Time: 06/15/14  5:38 PM  Result Value Ref Range   WBC 6.7 4.0 - 10.5 K/uL   RBC 3.93 3.87 - 5.11 MIL/uL   Hemoglobin 12.4 12.0 - 15.0 g/dL   HCT 37.0 36.0 - 46.0 %   MCV 94.1 78.0 - 100.0 fL   MCH 31.6 26.0 - 34.0 pg   MCHC 33.5 30.0 - 36.0 g/dL   RDW 12.4 11.5 -  15.5 %   Platelets 328 150 - 400 K/uL   Neutrophils Relative % 68 43 - 77 %   Neutro Abs 4.6 1.7 - 7.7 K/uL   Lymphocytes Relative 22 12 - 46 %   Lymphs Abs 1.5 0.7 - 4.0 K/uL   Monocytes Relative 9 3 - 12 %   Monocytes Absolute 0.6 0.1 - 1.0 K/uL   Eosinophils Relative 1 0 - 5 %   Eosinophils Absolute 0.1 0.0 - 0.7 K/uL   Basophils Relative 0 0 - 1 %   Basophils Absolute 0.0 0.0 - 0.1 K/uL  I-Stat Chem 8, ED     Status: Abnormal   Collection Time: 06/15/14  5:53 PM  Result Value Ref Range   Sodium 137 137 - 147 mEq/L   Potassium 3.5 (L) 3.7 - 5.3 mEq/L   Chloride 104 96 - 112 mEq/L   BUN 8 6 - 23 mg/dL   Creatinine, Ser 1.10 0.50 - 1.10 mg/dL   Glucose, Bld 100 (H) 70 - 99 mg/dL   Calcium, Ion 1.14 1.13 - 1.30 mmol/L   TCO2 18 0 - 100 mmol/L   Hemoglobin 12.9 12.0 - 15.0 g/dL   HCT 38.0 36.0 - 46.0 %  Lipase, blood     Status: None   Collection Time: 06/15/14  7:09 PM  Result Value Ref Range   Lipase 17 11 - 59 U/L  Urinalysis with microscopic     Status: Abnormal   Collection Time: 06/15/14  7:10 PM  Result Value Ref Range   Color, Urine YELLOW YELLOW   APPearance CLEAR CLEAR   Specific Gravity, Urine 1.022 1.005 - 1.030   pH 5.0 5.0 - 8.0   Glucose, UA NEGATIVE NEGATIVE mg/dL   Hgb urine dipstick NEGATIVE NEGATIVE   Bilirubin Urine NEGATIVE NEGATIVE   Ketones, ur NEGATIVE NEGATIVE mg/dL   Protein, ur NEGATIVE NEGATIVE mg/dL   Urobilinogen, UA 1.0 0.0 - 1.0 mg/dL   Nitrite NEGATIVE NEGATIVE   Leukocytes, UA SMALL (A) NEGATIVE  I-Stat CG4 Lactic Acid, ED     Status: Abnormal   Collection Time: 06/15/14  7:10 PM  Result Value Ref Range   Lactic Acid, Venous 4.69 (H) 0.5 - 2.2 mmol/L  Urine microscopic-add on     Status: Abnormal   Collection Time: 06/15/14  7:10 PM  Result Value Ref Range   Squamous Epithelial / LPF MANY (A) RARE   WBC, UA 3-6 <3 WBC/hpf   RBC / HPF 0-2 <3 RBC/hpf   Bacteria, UA FEW (A) RARE  Protime-INR  Status: Abnormal   Collection Time:  06/16/14  1:48 AM  Result Value Ref Range   Prothrombin Time 15.6 (H) 11.6 - 15.2 seconds   INR 1.22 0.00 - 1.49  APTT     Status: None   Collection Time: 06/16/14  1:48 AM  Result Value Ref Range   aPTT 33 24 - 37 seconds  Lipid panel     Status: None   Collection Time: 06/16/14  1:48 AM  Result Value Ref Range   Cholesterol 134 0 - 200 mg/dL   Triglycerides 91 <150 mg/dL   HDL 54 >39 mg/dL   Total CHOL/HDL Ratio 2.5 RATIO   VLDL 18 0 - 40 mg/dL   LDL Cholesterol 62 0 - 99 mg/dL    Comment:        Total Cholesterol/HDL:CHD Risk Coronary Heart Disease Risk Table                     Men   Women  1/2 Average Risk   3.4   3.3  Average Risk       5.0   4.4  2 X Average Risk   9.6   7.1  3 X Average Risk  23.4   11.0        Use the calculated Patient Ratio above and the CHD Risk Table to determine the patient's CHD Risk.        ATP III CLASSIFICATION (LDL):  <100     mg/dL   Optimal  100-129  mg/dL   Near or Above                    Optimal  130-159  mg/dL   Borderline  160-189  mg/dL   High  >190     mg/dL   Very High   Lactate dehydrogenase     Status: Abnormal   Collection Time: 06/16/14  1:48 AM  Result Value Ref Range   LDH 443 (H) 94 - 250 U/L  CBC     Status: Abnormal   Collection Time: 06/16/14  1:48 AM  Result Value Ref Range   WBC 8.2 4.0 - 10.5 K/uL   RBC 3.86 (L) 3.87 - 5.11 MIL/uL   Hemoglobin 11.6 (L) 12.0 - 15.0 g/dL   HCT 35.5 (L) 36.0 - 46.0 %   MCV 92.0 78.0 - 100.0 fL   MCH 30.1 26.0 - 34.0 pg   MCHC 32.7 30.0 - 36.0 g/dL   RDW 12.5 11.5 - 15.5 %   Platelets 300 150 - 400 K/uL  Comprehensive metabolic panel     Status: Abnormal   Collection Time: 06/16/14  1:48 AM  Result Value Ref Range   Sodium 139 137 - 147 mEq/L   Potassium 3.8 3.7 - 5.3 mEq/L   Chloride 103 96 - 112 mEq/L   CO2 21 19 - 32 mEq/L   Glucose, Bld 91 70 - 99 mg/dL   BUN 9 6 - 23 mg/dL   Creatinine, Ser 0.94 0.50 - 1.10 mg/dL   Calcium 8.4 8.4 - 10.5 mg/dL   Total Protein  5.9 (L) 6.0 - 8.3 g/dL   Albumin 2.8 (L) 3.5 - 5.2 g/dL   AST 16 0 - 37 U/L   ALT 6 0 - 35 U/L   Alkaline Phosphatase 66 39 - 117 U/L   Total Bilirubin 0.6 0.3 - 1.2 mg/dL   GFR calc non Af Amer 61 (L) >90 mL/min   GFR calc  Af Amer 71 (L) >90 mL/min    Comment: (NOTE) The eGFR has been calculated using the CKD EPI equation. This calculation has not been validated in all clinical situations. eGFR's persistently <90 mL/min signify possible Chronic Kidney Disease.    Anion gap 15 5 - 15  CA 125     Status: Abnormal   Collection Time: 06/16/14  5:55 AM  Result Value Ref Range   CA 125 300 (H) <35 U/mL    Comment: (NOTE) This test was performed using the Beckman Coulter chemiluminescent method.  Values obtained from different assay methods cannot be used interchangeably.  CA125 levels , regardless of value, should not be interpreted as absolute evidence of the presence or absence of disease. **Please note change in methodology. If re-baselining is needed, for patients who are being serially tested, please call customer service, within 3 days of collection, to request to add on the appropriate re-baselining test code for the previous methodology, at no charge.** Performed at [facility]   Glucose, capillary     Status: None   Collection Time: 06/16/14  8:18 AM  Result Value Ref Range   Glucose-Capillary 84 70 - 99 mg/dL  CEA     Status: None   Collection Time: 06/16/14  4:19 PM  Result Value Ref Range   CEA <0.5 0.0 - 5.0 ng/mL    Comment: Performed at [facility]  Glucose, capillary     Status: None   Collection Time: 06/17/14  8:09 AM  Result Value Ref Range   Glucose-Capillary 79 70 - 99 mg/dL   Ct Abdomen Pelvis Wo Contrast  06/15/2014   CLINICAL DATA:  68 year old female with 2 day history of left lower abdominal pain. History of kidney transplant 2005.  EXAM: CT ABDOMEN AND PELVIS WITHOUT CONTRAST  TECHNIQUE: Multidetector CT imaging of the abdomen and  pelvis was performed following the standard protocol without IV contrast.  COMPARISON:  Prior CT abdomen/pelvis 04/12/2006  FINDINGS: Lower Chest: The lung bases are clear. Visualized cardiac structures are within normal limits for size. No pericardial effusion. Small hiatal hernia.  Abdomen: Unenhanced CT was performed per clinician order. Lack of IV contrast limits sensitivity and specificity, especially for evaluation of abdominal/pelvic solid viscera. Unremarkable CT appearance of the stomach, duodenum, spleen, adrenal glands and pancreas save for fatty atrophy of the neck, head and uncinate process. Stable segment 2 hepatic cyst. Liver contours are within normal limits. No new discrete hepatic lesion. Gallbladder is unremarkable. No intra or extrahepatic biliary ductal dilatation. Native kidneys are markedly atrophic.  Numerous loops of mildly dilated small bowel proximally bleeding up to a focal segment of diffusely abnormal small bowel with circumferential irregular wall thickening significantly narrowing the lumen in resulting in partial obstruction. 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 Diffuse soft tissue nodularity lining the peritoneal lining of the pelvis. The at next a and uterus are ill-defined. There is a 3.1 cm water attenuation cystic lesion associated with the right ovary. This measures enlarged compared to 1.9 cm previously.  Right lower quadrant renal transplant without evidence of hydronephrosis or acute abnormality.  Pelvis: Diffuse soft tissue caking throughout the peritoneal lining of the pelvis. The adnexa and uterus are nearly inseparable. There is a 3.1 cm water attenuation cystic lesion affiliated with the right adnexum which has enlarged  compared to the 1.9 x  1.8 cm previously. Bilateral tubal ligation clips are present.  Bones/Soft Tissues: No acute fracture or aggressive appearing lytic or blastic osseous lesion. Lower lumbar facet arthropathy worse on the right than the left. Degenerative grade 1 anterolisthesis of L4 on L5. No significant interval progression compared to prior.  Vascular: Limited evaluation in the absence of intravenous contrast. Scattered atherosclerotic vascular calcifications without aneurysmal dilatation.  IMPRESSION: 1. 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 Circumferential mass like thickening of distal small bowel in the right lower quadrant results in partial small bowel obstruction. 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 Differential considerations include primary small bowel lymphoma (given known renal transplant presumed longstanding immunosuppressive therapy) and ovarian adenocarcinoma. Unfortunately, I see no discrete lesion that would be amenable to image guided percutaneous biopsy. The patient may require laparoscopic sampling for tissue diagnosis. 2. Interval enlargement of a water attenuation cystic lesion in the right adnexa. This may represent a primary ovarian malignancy or incidental enlargement of and otherwise benign ovarian cysts. 3. Small hiatal hernia. 4. Atrophic native kidneys. 5. Unremarkable right lower quadrant renal transplants. 6. Lower lumbar facet arthropathy and degenerative anterolisthesis of L4 on L5. 7. Atherosclerotic vascular calcifications.   Electronically Signed   By: Jacqulynn Cadet M.D.   On: 06/15/2014 23:10   Ct Chest Wo Contrast  06/16/2014   CLINICAL DATA:  Cough.  Lymphoma.  Abdominal carcinomatosis.  EXAM: CT CHEST WITHOUT CONTRAST  TECHNIQUE: Multidetector CT imaging of the chest was performed  following the standard protocol without IV contrast.  COMPARISON:  None.  FINDINGS: No abnormal mediastinal adenopathy. Tiny amount of pericardial fluid adjacent to the left ventricle.  Hiatal hernia contains adipose tissue and ascites.  Calcified granuloma at the left apex. Subsegmental atelectasis at the left base with volume loss and shift of the mediastinum to the left. Mild subsegmental atelectasis at the right lung base.  No destructive bone lesion.  Tiny central T6-T7 disc herniation.  No pneumothorax.  Ascites in the abdomen.  IMPRESSION: Bibasilar atelectasis left greater than right. No evidence of metastatic disease.   Electronically Signed   By: Maryclare Bean M.D.   On: 06/16/2014 20:11       Assessment/Plan 1. 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 PSBO 3. H/o renal transplant  Plan: 1. This case has been discussed with Dr. Donne Hazel who spoke to Dr. Burr Medico and Dr. Daleen Bo.  Our recommendation is for her to be seen by GYN onc as this appears to likely be ovarian in origin based off of her scan and her elevated CA 125.  Her transplant kidney is close to all of this that is stuck in her pelvis.  It would be beneficial to have her be seen at [facility] where she had her transplant and can be seen by [facility] at the same time as well.  She may also need more than just a biopsy given she appears to clinically be obstructed given her current nausea and occasional emesis.  We will not proceed with a dx lap for biopsy at this time, pending possible transfer to [facility] 06/17/2014, 10:24 AM Pager: [phone]

## 2014-06-28 ENCOUNTER — Inpatient Hospital Stay: Payer: Self-pay | Admitting: Internal Medicine

## 2014-06-28 LAB — COMPREHENSIVE METABOLIC PANEL
ANION GAP: 14 (ref 7–16)
AST: 33 U/L (ref 15–37)
Albumin: 2.3 g/dL — ABNORMAL LOW (ref 3.4–5.0)
Alkaline Phosphatase: 54 U/L
BUN: 15 mg/dL (ref 7–18)
Bilirubin,Total: 0.9 mg/dL (ref 0.2–1.0)
CO2: 24 mmol/L (ref 21–32)
CREATININE: 1.12 mg/dL (ref 0.60–1.30)
Calcium, Total: 8 mg/dL — ABNORMAL LOW (ref 8.5–10.1)
Chloride: 105 mmol/L (ref 98–107)
EGFR (Non-African Amer.): 51 — ABNORMAL LOW
GLUCOSE: 81 mg/dL (ref 65–99)
Osmolality: 285 (ref 275–301)
POTASSIUM: 4.5 mmol/L (ref 3.5–5.1)
SGPT (ALT): 12 U/L — ABNORMAL LOW
SODIUM: 143 mmol/L (ref 136–145)
Total Protein: 4.8 g/dL — ABNORMAL LOW (ref 6.4–8.2)

## 2014-06-28 LAB — URINALYSIS, COMPLETE
BACTERIA: NONE SEEN
Blood: NEGATIVE
GLUCOSE, UR: NEGATIVE mg/dL (ref 0–75)
Leukocyte Esterase: NEGATIVE
NITRITE: NEGATIVE
PH: 5 (ref 4.5–8.0)
Protein: 30
RBC,UR: 1 /HPF (ref 0–5)
Specific Gravity: 1.032 (ref 1.003–1.030)
WBC UR: 2 /HPF (ref 0–5)

## 2014-06-28 LAB — CBC
HCT: 38.2 % (ref 35.0–47.0)
HGB: 12.1 g/dL (ref 12.0–16.0)
MCH: 30.6 pg (ref 26.0–34.0)
MCHC: 31.7 g/dL — ABNORMAL LOW (ref 32.0–36.0)
MCV: 97 fL (ref 80–100)
PLATELETS: 464 10*3/uL — AB (ref 150–440)
RBC: 3.95 10*6/uL (ref 3.80–5.20)
RDW: 14.1 % (ref 11.5–14.5)
WBC: 5.8 10*3/uL (ref 3.6–11.0)

## 2014-06-28 LAB — LIPASE, BLOOD: Lipase: 30 U/L — ABNORMAL LOW (ref 73–393)

## 2014-06-29 LAB — BASIC METABOLIC PANEL
ANION GAP: 11 (ref 7–16)
BUN: 17 mg/dL (ref 7–18)
CO2: 23 mmol/L (ref 21–32)
CREATININE: 1.03 mg/dL (ref 0.60–1.30)
Calcium, Total: 7.5 mg/dL — ABNORMAL LOW (ref 8.5–10.1)
Chloride: 107 mmol/L (ref 98–107)
EGFR (African American): >60
EGFR (Non-African Amer.): 57 — ABNORMAL LOW
GLUCOSE: 92 mg/dL (ref 65–99)
Osmolality: 282 (ref 275–301)
POTASSIUM: 4.3 mmol/L (ref 3.5–5.1)
SODIUM: 141 mmol/L (ref 136–145)

## 2014-06-29 LAB — CBC WITH DIFFERENTIAL/PLATELET
BASOS ABS: 0 10*3/uL (ref 0.0–0.1)
Basophil %: 0.1 %
Eosinophil #: 0 10*3/uL (ref 0.0–0.7)
Eosinophil %: 0.1 %
HCT: 35.8 % (ref 35.0–47.0)
HGB: 11.6 g/dL — ABNORMAL LOW (ref 12.0–16.0)
Lymphocyte #: 0.4 10*3/uL — ABNORMAL LOW (ref 1.0–3.6)
Lymphocyte %: 8 %
MCH: 31.2 pg (ref 26.0–34.0)
MCHC: 32.3 g/dL (ref 32.0–36.0)
MCV: 97 fL (ref 80–100)
MONOS PCT: 6.5 %
Monocyte #: 0.3 x10 3/mm (ref 0.2–0.9)
NEUTROS PCT: 85.3 %
Neutrophil #: 4.6 10*3/uL (ref 1.4–6.5)
PLATELETS: 414 10*3/uL (ref 150–440)
RBC: 3.7 10*6/uL — AB (ref 3.80–5.20)
RDW: 14.2 % (ref 11.5–14.5)
WBC: 5.4 10*3/uL (ref 3.6–11.0)

## 2014-07-07 ENCOUNTER — Other Ambulatory Visit (HOSPITAL_COMMUNITY): Payer: Self-pay

## 2014-07-07 DIAGNOSIS — C800 Disseminated malignant neoplasm, unspecified: Secondary | ICD-10-CM

## 2014-07-08 ENCOUNTER — Emergency Department (HOSPITAL_COMMUNITY)
Admission: EM | Admit: 2014-07-08 | Discharge: 2014-07-09 | Disposition: A | Discharge status: Other Acute Inpatient Hospital | Payer: Medicare Other | Attending: Emergency Medicine | Admitting: Emergency Medicine

## 2014-07-08 ENCOUNTER — Emergency Department (HOSPITAL_COMMUNITY): Payer: Medicare Other

## 2014-07-08 DIAGNOSIS — E860 Dehydration: Secondary | ICD-10-CM | POA: Diagnosis not present

## 2014-07-08 DIAGNOSIS — Z792 Long term (current) use of antibiotics: Secondary | ICD-10-CM | POA: Diagnosis not present

## 2014-07-08 DIAGNOSIS — Z7952 Long term (current) use of systemic steroids: Secondary | ICD-10-CM | POA: Diagnosis not present

## 2014-07-08 DIAGNOSIS — R1110 Vomiting, unspecified: Secondary | ICD-10-CM

## 2014-07-08 DIAGNOSIS — I10 Essential (primary) hypertension: Secondary | ICD-10-CM | POA: Insufficient documentation

## 2014-07-08 DIAGNOSIS — R530 Neoplastic (malignant) related fatigue: Secondary | ICD-10-CM | POA: Diagnosis not present

## 2014-07-08 DIAGNOSIS — R519 Headache, unspecified: Secondary | ICD-10-CM

## 2014-07-08 DIAGNOSIS — M7981 Nontraumatic hematoma of soft tissue: Secondary | ICD-10-CM | POA: Diagnosis not present

## 2014-07-08 DIAGNOSIS — M542 Cervicalgia: Secondary | ICD-10-CM | POA: Diagnosis not present

## 2014-07-08 DIAGNOSIS — R112 Nausea with vomiting, unspecified: Secondary | ICD-10-CM | POA: Diagnosis not present

## 2014-07-08 DIAGNOSIS — F329 Major depressive disorder, single episode, unspecified: Secondary | ICD-10-CM | POA: Diagnosis not present

## 2014-07-08 DIAGNOSIS — D72829 Elevated white blood cell count, unspecified: Secondary | ICD-10-CM

## 2014-07-08 DIAGNOSIS — R630 Anorexia: Secondary | ICD-10-CM | POA: Insufficient documentation

## 2014-07-08 DIAGNOSIS — C8330 Diffuse large B-cell lymphoma, unspecified site: Secondary | ICD-10-CM | POA: Diagnosis not present

## 2014-07-08 DIAGNOSIS — R51 Headache: Secondary | ICD-10-CM | POA: Diagnosis not present

## 2014-07-08 DIAGNOSIS — T451X5A Adverse effect of antineoplastic and immunosuppressive drugs, initial encounter: Secondary | ICD-10-CM

## 2014-07-08 DIAGNOSIS — Z79899 Other long term (current) drug therapy: Secondary | ICD-10-CM | POA: Insufficient documentation

## 2014-07-08 LAB — CBC WITH DIFFERENTIAL/PLATELET
BASOS ABS: 0 10*3/uL (ref 0.0–0.1)
Basophils Relative: 0 % (ref 0–1)
EOS ABS: 0 10*3/uL (ref 0.0–0.7)
Eosinophils Relative: 0 % (ref 0–5)
HEMATOCRIT: 31.3 % — AB (ref 36.0–46.0)
HEMOGLOBIN: 10 g/dL — AB (ref 12.0–15.0)
Lymphocytes Relative: 1 % — ABNORMAL LOW (ref 12–46)
Lymphs Abs: 0.3 10*3/uL — ABNORMAL LOW (ref 0.7–4.0)
MCH: 29.7 pg (ref 26.0–34.0)
MCHC: 31.9 g/dL (ref 30.0–36.0)
MCV: 92.9 fL (ref 78.0–100.0)
MONOS PCT: 0 % — AB (ref 3–12)
Monocytes Absolute: 0 10*3/uL — ABNORMAL LOW (ref 0.1–1.0)
Neutro Abs: 28.4 10*3/uL — ABNORMAL HIGH (ref 1.7–7.7)
Neutrophils Relative %: 99 % — ABNORMAL HIGH (ref 43–77)
PLATELETS: 170 10*3/uL (ref 150–400)
RBC: 3.37 MIL/uL — ABNORMAL LOW (ref 3.87–5.11)
RDW: 14.5 % (ref 11.5–15.5)
WBC: 28.7 10*3/uL — AB (ref 4.0–10.5)

## 2014-07-08 LAB — COMPREHENSIVE METABOLIC PANEL
ALBUMIN: 2.1 g/dL — AB (ref 3.5–5.2)
ALT: 15 U/L (ref 0–35)
ANION GAP: 8 (ref 5–15)
AST: 21 U/L (ref 0–37)
Alkaline Phosphatase: 55 U/L (ref 39–117)
BUN: 8 mg/dL (ref 6–23)
CALCIUM: 7.7 mg/dL — AB (ref 8.4–10.5)
CO2: 25 mmol/L (ref 19–32)
Chloride: 109 mEq/L (ref 96–112)
Creatinine, Ser: 0.64 mg/dL (ref 0.50–1.10)
GFR calc non Af Amer: 90 mL/min — ABNORMAL LOW (ref >90)
GLUCOSE: 127 mg/dL — AB (ref 70–99)
POTASSIUM: 3.6 mmol/L (ref 3.5–5.1)
Sodium: 142 mmol/L (ref 135–145)
Total Bilirubin: 0.7 mg/dL (ref 0.3–1.2)
Total Protein: 3.9 g/dL — ABNORMAL LOW (ref 6.0–8.3)

## 2014-07-08 LAB — TYPE AND SCREEN
ABO/RH(D): O POS
Antibody Screen: NEGATIVE

## 2014-07-08 LAB — URINALYSIS, ROUTINE W REFLEX MICROSCOPIC
BILIRUBIN URINE: NEGATIVE
Glucose, UA: NEGATIVE mg/dL
HGB URINE DIPSTICK: NEGATIVE
KETONES UR: NEGATIVE mg/dL
Leukocytes, UA: NEGATIVE
Nitrite: NEGATIVE
PH: 7.5 (ref 5.0–8.0)
PROTEIN: NEGATIVE mg/dL
Specific Gravity, Urine: 1.014 (ref 1.005–1.030)
Urobilinogen, UA: 2 mg/dL — ABNORMAL HIGH (ref 0.0–1.0)

## 2014-07-08 LAB — MAGNESIUM: Magnesium: 1.8 mg/dL (ref 1.5–2.5)

## 2014-07-08 LAB — I-STAT CHEM 8, ED
BUN: 8 mg/dL (ref 6–23)
CALCIUM ION: 1.04 mmol/L — AB (ref 1.13–1.30)
Chloride: 105 mEq/L (ref 96–112)
Creatinine, Ser: 0.5 mg/dL (ref 0.50–1.10)
GLUCOSE: 124 mg/dL — AB (ref 70–99)
HEMATOCRIT: 32 % — AB (ref 36.0–46.0)
Hemoglobin: 10.9 g/dL — ABNORMAL LOW (ref 12.0–15.0)
Potassium: 3.6 mmol/L (ref 3.5–5.1)
SODIUM: 140 mmol/L (ref 135–145)
TCO2: 21 mmol/L (ref 0–100)

## 2014-07-08 LAB — ABO/RH: ABO/RH(D): O POS

## 2014-07-08 LAB — I-STAT TROPONIN, ED: Troponin i, poc: 0 ng/mL (ref 0.00–0.08)

## 2014-07-08 MED ORDER — SODIUM CHLORIDE 0.9 % IV BOLUS (SEPSIS)
1000.0000 mL | Freq: Once | INTRAVENOUS | Status: AC
  Administered 2014-07-08: 1000 mL via INTRAVENOUS

## 2014-07-08 MED ORDER — ONDANSETRON HCL 4 MG/2ML IJ SOLN
4.0000 mg | Freq: Once | INTRAMUSCULAR | Status: AC
  Administered 2014-07-08: 4 mg via INTRAVENOUS
  Filled 2014-07-08: qty 2

## 2014-07-08 NOTE — ED Notes (Signed)
Pt presents with headache, neck pain, and nausea onset today- family reports pt had spinal tap done on Monday and they noticed that she has had fluid leaking from the site throughout the day.  Denies numbness to extremities.  Pt alert and oriented X 4 at present.

## 2014-07-08 NOTE — ED Provider Notes (Signed)
CSN: [id number]     Arrival date & time 07/08/14  2053 History   First MD Initiated Contact with Patient 07/08/14 2108     Chief Complaint  Patient presents with  . Headache     (Consider location/radiation/quality/duration/timing/severity/associated sxs/prior Treatment) Patient is a 68 y.o. female presenting with vomiting.  Emesis Severity:  Severe Duration:  2 days Timing:  Constant Quality:  Stomach contents Feeding tolerance: unable to tolerate. Progression:  Worsening Chronicity:  New Relieved by:  Nothing Worsened by:  Nothing tried Ineffective treatments:  Antiemetics Associated symptoms: headaches   Associated symptoms: no abdominal pain, no chills, no cough, no diarrhea, no fever, no myalgias, no sore throat and no URI   Risk factors: no prior abdominal surgery and no sick contacts     No past medical history on file. No past surgical history on file. No family history on file. History  Substance Use Topics  . Smoking status: Not on file  . Smokeless tobacco: Not on file  . Alcohol Use: Not on file   OB History    No data available     Review of Systems  Constitutional: Positive for activity change, appetite change and fatigue. Negative for fever and chills.  HENT: Negative for sore throat.   Eyes: Negative for visual disturbance.  Respiratory: Negative for cough and shortness of breath.   Cardiovascular: Negative for chest pain.  Gastrointestinal: Positive for nausea and vomiting. Negative for abdominal pain and diarrhea.  Genitourinary: Negative for difficulty urinating.  Musculoskeletal: Positive for neck pain. Negative for myalgias and back pain.  Skin: Negative for rash.  Neurological: Positive for headaches. Negative for syncope.      Allergies  Review of patient's allergies indicates no known allergies.  Home Medications   Prior to Admission medications   Medication Sig Start Date End Date Taking? Authorizing Provider  Atorvastatin  Calcium (LIPITOR PO) Take by mouth.   Yes Historical Provider, MD  Escitalopram Oxalate (LEXAPRO PO) Take 1 tablet by mouth daily.   Yes Historical Provider, MD  Esomeprazole Magnesium (NEXIUM PO) Take 1 tablet by mouth daily as needed.   Yes Historical Provider, MD  levofloxacin (LEVAQUIN) 500 MG tablet Take 500 mg by mouth daily.   Yes Historical Provider, MD  OXYCODONE HCL PO Take 1 tablet by mouth daily as needed.   Yes Historical Provider, MD  predniSONE (DELTASONE) 10 MG tablet Take 10 mg by mouth daily with breakfast.   Yes Historical Provider, MD  Promethazine HCl (PHENERGAN PO) Take 1 tablet by mouth daily as needed.   Yes Historical Provider, MD  sulfamethoxazole-trimethoprim (BACTRIM,SEPTRA) 400-80 MG per tablet Take 1 tablet by mouth 2 (two) times daily.   Yes Historical Provider, MD  tacrolimus (PROGRAF) 0.5 MG capsule Take 0.5 mg by mouth 2 (two) times daily.   Yes Historical Provider, MD   BP 104/51 mmHg  Pulse 79  Temp(Src) 98 F (36.7 C) (Oral)  Resp 14  Ht 5\' 4"  (1.626 m)  Wt 204 lb (92.534 kg)  BMI 35.00 kg/m2  SpO2 97% Physical Exam  Constitutional: She is oriented to person, place, and time. She appears well-developed and well-nourished. She has a sickly appearance. She appears ill. No distress.  HENT:  Head: Normocephalic and atraumatic.  Mouth/Throat: No oropharyngeal exudate.  Eyes: Conjunctivae and EOM are normal. Pupils are equal, round, and reactive to light.  Neck: Normal range of motion. Neck supple. No rigidity. Normal range of motion present.  Cardiovascular: Normal rate, regular rhythm,  normal heart sounds and intact distal pulses.  Exam reveals no gallop and no friction rub.   No murmur heard. Pulmonary/Chest: Effort normal and breath sounds normal. No respiratory distress. She has no wheezes. She has no rales.  Abdominal: Soft. She exhibits no distension. There is no tenderness. There is no guarding.  Peritoneal drain with cloudy yellow fluid in place    Musculoskeletal: She exhibits no tenderness.       Lumbar back: She exhibits edema (contusion over lower lumbar spine, edema of lower back, small needle stick site with leaking of fluid).  Neurological: She is alert and oriented to person, place, and time. She has normal strength. No cranial nerve deficit or sensory deficit. Coordination normal. Abnormal gait: did not ambulate at this time due to generalized weakness. GCS eye subscore is 4. GCS verbal subscore is 5. GCS motor subscore is 6.  Skin: Skin is warm and dry. No rash noted. She is not diaphoretic. No erythema.  Nursing note and vitals reviewed.   ED Course  Procedures (including critical care time) Labs Review Labs Reviewed  COMPREHENSIVE METABOLIC PANEL - Abnormal; Notable for the following:    Glucose, Bld 127 (*)    Calcium 7.7 (*)    Total Protein 3.9 (*)    Albumin 2.1 (*)    GFR calc non Af Amer 90 (*)    All other components within normal limits  CBC WITH DIFFERENTIAL - Abnormal; Notable for the following:    WBC 28.7 (*)    RBC 3.37 (*)    Hemoglobin 10.0 (*)    HCT 31.3 (*)    Neutrophils Relative % 99 (*)    Lymphocytes Relative 1 (*)    Monocytes Relative 0 (*)    Neutro Abs 28.4 (*)    Lymphs Abs 0.3 (*)    Monocytes Absolute 0.0 (*)    All other components within normal limits  URINALYSIS, ROUTINE W REFLEX MICROSCOPIC - Abnormal; Notable for the following:    Urobilinogen, UA 2.0 (*)    All other components within normal limits  I-STAT CHEM 8, ED - Abnormal; Notable for the following:    Glucose, Bld 124 (*)    Calcium, Ion 1.04 (*)    Hemoglobin 10.9 (*)    HCT 32.0 (*)    All other components within normal limits  CULTURE, BLOOD (ROUTINE X 2)  CULTURE, BLOOD (ROUTINE X 2)  URINE CULTURE  BODY FLUID CULTURE  MAGNESIUM  BODY FLUID CELL COUNT WITH DIFFERENTIAL  I-STAT TROPOININ, ED  TYPE AND SCREEN  ABO/RH    Imaging Review Ct Head Wo Contrast  07/08/2014   CLINICAL DATA:  Headache  EXAM:  CT HEAD WITHOUT CONTRAST  TECHNIQUE: Contiguous axial images were obtained from the base of the skull through the vertex without intravenous contrast.  COMPARISON:  None.  FINDINGS: No acute hemorrhage, infarct, or mass lesion is identified. No midline shift. Ventricles are normal in size. Orbits and paranasal sinuses are unremarkable. No skull fracture.  IMPRESSION: Normal head CT.   Electronically Signed   By: Conchita Paris M.D.   On: 07/08/2014 22:22     EKG Interpretation   Date/Time:  Thursday July 08 2014 22:09:30 EST Ventricular Rate:  90 PR Interval:  135 QRS Duration: 75 QT Interval:  398 QTC Calculation: 487 R Axis:   69 Text Interpretation:  Sinus rhythm Low voltage, precordial leads  Nonspecific T abnormalities, lateral leads Borderline prolonged QT  interval No old tracing to compare  Confirmed by KNAPP  MD-J, JON [phone])  on 07/08/2014 10:21:14 PM         MDM   Final diagnoses:  Headache    68 year old female with a history of recently diagnosed diffuse large B-cell lymphoma, hypertension, depression, renal transplant, small bowel obstruction, recent admissions to [facility] for initial diagnosis and treatment with intrathecal chemotherapy 4 days ago presents with concern of fluid leaking from LP site, headache, neck pain, nausea and vomiting with inability to tolerate po.    Examined LP site and found to have fluid leaking, however have low suspicion this represents spinal fluid given she is 4 days status post lumbar puncture and has significant edema of the lower back with this being more likely source of fluid. CT head was done given concern of headache/?sp leak and showed no acute intracranial findings. Patient is afebrile, oriented, good range of motion of neck and have low suspicion at this time for bacterial meningitis. Low susp for SAH by hx.  Her labs are significant for a white count of 28.7 which is likely due to neutrophil stimulator given with  last chemotherapy.  Given patient's initial appearance on arrival and history of chemo with unknown wbc at the time, blood and urine cultures were sent. Peritoneal cultures were sent given family concern of change in color of peritoneal fluid.  Urinalysis negative.  Given patient taking zofran at home and not able to eat or drink, recommend admission and discussed with Dr. Kendall Flack Oncology at [facility] and will transfer.   Alvino Chapel, MD 07/09/14 [id number]  Dorie Rank, MD 07/09/14 [phone]

## 2014-07-09 DIAGNOSIS — M7981 Nontraumatic hematoma of soft tissue: Secondary | ICD-10-CM | POA: Diagnosis not present

## 2014-07-09 DIAGNOSIS — Z79899 Other long term (current) drug therapy: Secondary | ICD-10-CM | POA: Diagnosis not present

## 2014-07-09 DIAGNOSIS — D72829 Elevated white blood cell count, unspecified: Secondary | ICD-10-CM | POA: Diagnosis not present

## 2014-07-09 DIAGNOSIS — R630 Anorexia: Secondary | ICD-10-CM | POA: Diagnosis not present

## 2014-07-09 DIAGNOSIS — I10 Essential (primary) hypertension: Secondary | ICD-10-CM | POA: Diagnosis not present

## 2014-07-09 DIAGNOSIS — F329 Major depressive disorder, single episode, unspecified: Secondary | ICD-10-CM | POA: Diagnosis not present

## 2014-07-09 DIAGNOSIS — E860 Dehydration: Secondary | ICD-10-CM | POA: Diagnosis not present

## 2014-07-09 DIAGNOSIS — R51 Headache: Secondary | ICD-10-CM | POA: Diagnosis present

## 2014-07-09 DIAGNOSIS — C8330 Diffuse large B-cell lymphoma, unspecified site: Secondary | ICD-10-CM | POA: Diagnosis not present

## 2014-07-09 DIAGNOSIS — R112 Nausea with vomiting, unspecified: Secondary | ICD-10-CM | POA: Diagnosis not present

## 2014-07-09 DIAGNOSIS — Z7952 Long term (current) use of systemic steroids: Secondary | ICD-10-CM | POA: Diagnosis not present

## 2014-07-09 DIAGNOSIS — Z792 Long term (current) use of antibiotics: Secondary | ICD-10-CM | POA: Diagnosis not present

## 2014-07-09 DIAGNOSIS — M542 Cervicalgia: Secondary | ICD-10-CM | POA: Diagnosis not present

## 2014-07-09 DIAGNOSIS — R530 Neoplastic (malignant) related fatigue: Secondary | ICD-10-CM | POA: Diagnosis not present

## 2014-07-09 LAB — BODY FLUID CELL COUNT WITH DIFFERENTIAL
Lymphs, Fluid: 85 %
Neutrophil Count, Fluid: 15 % (ref 0–25)
Total Nucleated Cell Count, Fluid: 152 cu mm (ref 0–1000)

## 2014-07-11 LAB — URINE CULTURE
CULTURE: NO GROWTH
Colony Count: NO GROWTH

## 2014-07-12 ENCOUNTER — Encounter (HOSPITAL_COMMUNITY): Payer: Medicare Other | Attending: Hematology & Oncology

## 2014-07-12 DIAGNOSIS — C800 Disseminated malignant neoplasm, unspecified: Secondary | ICD-10-CM | POA: Insufficient documentation

## 2014-07-12 LAB — CBC WITH DIFFERENTIAL/PLATELET
Basophils Absolute: 0 10*3/uL (ref 0.0–0.1)
Basophils Relative: 0 % (ref 0–1)
EOS ABS: 0 10*3/uL (ref 0.0–0.7)
EOS PCT: 0 % (ref 0–5)
HEMATOCRIT: 29.7 % — AB (ref 36.0–46.0)
HEMOGLOBIN: 9.6 g/dL — AB (ref 12.0–15.0)
LYMPHS ABS: 0.3 10*3/uL — AB (ref 0.7–4.0)
LYMPHS PCT: 90 % — AB (ref 12–46)
MCH: 30.8 pg (ref 26.0–34.0)
MCHC: 32.3 g/dL (ref 30.0–36.0)
MCV: 95.2 fL (ref 78.0–100.0)
Monocytes Absolute: 0 10*3/uL — ABNORMAL LOW (ref 0.1–1.0)
Monocytes Relative: 3 % (ref 3–12)
NEUTROS ABS: 0 10*3/uL — AB (ref 1.7–7.7)
NEUTROS PCT: 0 % — AB (ref 43–77)
Platelets: 35 10*3/uL — ABNORMAL LOW (ref 150–400)
RBC: 3.12 MIL/uL — AB (ref 3.87–5.11)
RDW: 14.1 % (ref 11.5–15.5)
WBC: 0.3 10*3/uL — AB (ref 4.0–10.5)

## 2014-07-12 LAB — BODY FLUID CULTURE: CULTURE: NO GROWTH

## 2014-07-12 LAB — COMPREHENSIVE METABOLIC PANEL
ALBUMIN: 2.9 g/dL — AB (ref 3.5–5.2)
ALT: 17 U/L (ref 0–35)
AST: 14 U/L (ref 0–37)
Alkaline Phosphatase: 61 U/L (ref 39–117)
Anion gap: 6 (ref 5–15)
BILIRUBIN TOTAL: 0.8 mg/dL (ref 0.3–1.2)
BUN: 7 mg/dL (ref 6–23)
CO2: 23 mmol/L (ref 19–32)
Calcium: 8.1 mg/dL — ABNORMAL LOW (ref 8.4–10.5)
Chloride: 108 mEq/L (ref 96–112)
Creatinine, Ser: 0.56 mg/dL (ref 0.50–1.10)
GFR calc Af Amer: >90 mL/min (ref >90)
GFR calc non Af Amer: >90 mL/min (ref >90)
Glucose, Bld: 115 mg/dL — ABNORMAL HIGH (ref 70–99)
Potassium: 3.5 mmol/L (ref 3.5–5.1)
SODIUM: 137 mmol/L (ref 135–145)
Total Protein: 5.1 g/dL — ABNORMAL LOW (ref 6.0–8.3)

## 2014-07-12 LAB — MAGNESIUM: MAGNESIUM: 1.6 mg/dL (ref 1.5–2.5)

## 2014-07-12 NOTE — Progress Notes (Signed)
CRITICAL VALUE ALERT Critical value received:  WBC 0.3/ platelets 35,000 Date of notification:  07/12/14 Time of notification: 1200 Critical value read back:  Yes.   Nurse who received alert:  T.Raelea Gosse,RN MD notified (1st page):  Verbal to Dr.Penland at 1203,results FAXED to [facility]

## 2014-07-12 NOTE — Progress Notes (Signed)
Lab draw

## 2014-07-13 LAB — PATHOLOGIST SMEAR REVIEW

## 2014-07-15 ENCOUNTER — Encounter (HOSPITAL_COMMUNITY): Payer: Medicare Other

## 2014-07-15 DIAGNOSIS — C800 Disseminated malignant neoplasm, unspecified: Secondary | ICD-10-CM | POA: Diagnosis not present

## 2014-07-15 LAB — CULTURE, BLOOD (ROUTINE X 2)
Culture: NO GROWTH
Culture: NO GROWTH

## 2014-07-15 LAB — CBC WITH DIFFERENTIAL/PLATELET
Basophils Absolute: 0 10*3/uL (ref 0.0–0.1)
Basophils Relative: 1 % (ref 0–1)
EOS ABS: 0 10*3/uL (ref 0.0–0.7)
Eosinophils Relative: 0 % (ref 0–5)
HCT: 30.8 % — ABNORMAL LOW (ref 36.0–46.0)
Hemoglobin: 9.9 g/dL — ABNORMAL LOW (ref 12.0–15.0)
LYMPHS ABS: 0.5 10*3/uL — AB (ref 0.7–4.0)
Lymphocytes Relative: 24 % (ref 12–46)
MCH: 30.4 pg (ref 26.0–34.0)
MCHC: 32.1 g/dL (ref 30.0–36.0)
MCV: 94.5 fL (ref 78.0–100.0)
Monocytes Absolute: 0.2 10*3/uL (ref 0.1–1.0)
Monocytes Relative: 12 % (ref 3–12)
Neutro Abs: 1.2 10*3/uL — ABNORMAL LOW (ref 1.7–7.7)
Neutrophils Relative %: 64 % (ref 43–77)
Platelets: 38 10*3/uL — ABNORMAL LOW (ref 150–400)
RBC: 3.26 MIL/uL — ABNORMAL LOW (ref 3.87–5.11)
RDW: 13.7 % (ref 11.5–15.5)
WBC Morphology: INCREASED
WBC: 1.9 10*3/uL — ABNORMAL LOW (ref 4.0–10.5)

## 2014-07-19 ENCOUNTER — Other Ambulatory Visit (HOSPITAL_COMMUNITY): Payer: Medicare Other

## 2014-07-22 ENCOUNTER — Other Ambulatory Visit (HOSPITAL_COMMUNITY): Payer: Medicare Other

## 2014-08-02 ENCOUNTER — Encounter (HOSPITAL_COMMUNITY): Payer: Self-pay | Admitting: Hematology & Oncology

## 2014-08-02 ENCOUNTER — Encounter (HOSPITAL_COMMUNITY): Payer: Medicare Other | Attending: Hematology & Oncology | Admitting: Hematology & Oncology

## 2014-08-02 ENCOUNTER — Encounter (HOSPITAL_COMMUNITY): Payer: Medicare Other

## 2014-08-02 ENCOUNTER — Other Ambulatory Visit (HOSPITAL_COMMUNITY): Payer: Self-pay | Admitting: Hematology & Oncology

## 2014-08-02 ENCOUNTER — Encounter (HOSPITAL_BASED_OUTPATIENT_CLINIC_OR_DEPARTMENT_OTHER): Payer: Medicare Other

## 2014-08-02 VITALS — BP 112/77 | HR 105 | Temp 97.7°F | Resp 18 | Wt 162.1 lb

## 2014-08-02 VITALS — BP 123/61 | HR 95 | Temp 97.8°F | Resp 18

## 2014-08-02 DIAGNOSIS — C800 Disseminated malignant neoplasm, unspecified: Secondary | ICD-10-CM | POA: Diagnosis present

## 2014-08-02 DIAGNOSIS — H9190 Unspecified hearing loss, unspecified ear: Secondary | ICD-10-CM

## 2014-08-02 DIAGNOSIS — C8590 Non-Hodgkin lymphoma, unspecified, unspecified site: Secondary | ICD-10-CM | POA: Insufficient documentation

## 2014-08-02 DIAGNOSIS — R110 Nausea: Secondary | ICD-10-CM

## 2014-08-02 DIAGNOSIS — C8333 Diffuse large B-cell lymphoma, intra-abdominal lymph nodes: Secondary | ICD-10-CM

## 2014-08-02 DIAGNOSIS — R112 Nausea with vomiting, unspecified: Secondary | ICD-10-CM

## 2014-08-02 LAB — ABO/RH: ABO/RH(D): O POS

## 2014-08-02 LAB — COMPREHENSIVE METABOLIC PANEL
ALBUMIN: 3 g/dL — AB (ref 3.5–5.2)
ALT: 13 U/L (ref 0–35)
AST: 11 U/L (ref 0–37)
Alkaline Phosphatase: 49 U/L (ref 39–117)
Anion gap: 5 (ref 5–15)
BILIRUBIN TOTAL: 0.7 mg/dL (ref 0.3–1.2)
BUN: 20 mg/dL (ref 6–23)
CO2: 26 mmol/L (ref 19–32)
Calcium: 8.3 mg/dL — ABNORMAL LOW (ref 8.4–10.5)
Chloride: 106 mEq/L (ref 96–112)
Creatinine, Ser: 0.66 mg/dL (ref 0.50–1.10)
GFR calc Af Amer: >90 mL/min (ref >90)
GFR calc non Af Amer: 89 mL/min — ABNORMAL LOW (ref >90)
GLUCOSE: 108 mg/dL — AB (ref 70–99)
Potassium: 3.7 mmol/L (ref 3.5–5.1)
Sodium: 137 mmol/L (ref 135–145)
Total Protein: 5.1 g/dL — ABNORMAL LOW (ref 6.0–8.3)

## 2014-08-02 LAB — CBC WITH DIFFERENTIAL/PLATELET
BASOS ABS: 0 10*3/uL (ref 0.0–0.1)
Basophils Relative: 0 % (ref 0–1)
EOS ABS: 0.1 10*3/uL (ref 0.0–0.7)
Eosinophils Relative: 2 % (ref 0–5)
HCT: 25.4 % — ABNORMAL LOW (ref 36.0–46.0)
HEMOGLOBIN: 8.1 g/dL — AB (ref 12.0–15.0)
Lymphocytes Relative: 5 % — ABNORMAL LOW (ref 12–46)
Lymphs Abs: 0.3 10*3/uL — ABNORMAL LOW (ref 0.7–4.0)
MCH: 31 pg (ref 26.0–34.0)
MCHC: 31.9 g/dL (ref 30.0–36.0)
MCV: 97.3 fL (ref 78.0–100.0)
MONO ABS: 0 10*3/uL — AB (ref 0.1–1.0)
MONOS PCT: 0 % — AB (ref 3–12)
NEUTROS PCT: 93 % — AB (ref 43–77)
Neutro Abs: 4.8 10*3/uL (ref 1.7–7.7)
Platelets: 166 10*3/uL (ref 150–400)
RBC: 2.61 MIL/uL — AB (ref 3.87–5.11)
RDW: 16.4 % — ABNORMAL HIGH (ref 11.5–15.5)
WBC: 5.2 10*3/uL (ref 4.0–10.5)

## 2014-08-02 LAB — PREPARE RBC (CROSSMATCH)

## 2014-08-02 LAB — MAGNESIUM: Magnesium: 1.9 mg/dL (ref 1.5–2.5)

## 2014-08-02 MED ORDER — PEGFILGRASTIM INJECTION 6 MG/0.6ML ~~LOC~~
6.0000 mg | PREFILLED_SYRINGE | Freq: Once | SUBCUTANEOUS | Status: AC
  Administered 2014-08-02: 6 mg via SUBCUTANEOUS

## 2014-08-02 MED ORDER — SODIUM CHLORIDE 0.9 % IV SOLN
12.0000 mg | Freq: Once | INTRAVENOUS | Status: AC
  Administered 2014-08-02: 12 mg via INTRAVENOUS
  Filled 2014-08-02: qty 1.2

## 2014-08-02 MED ORDER — SODIUM CHLORIDE 0.9 % IV SOLN
Freq: Once | INTRAVENOUS | Status: AC
  Administered 2014-08-02: 12:00:00 via INTRAVENOUS

## 2014-08-02 MED ORDER — PEGFILGRASTIM INJECTION 6 MG/0.6ML ~~LOC~~
PREFILLED_SYRINGE | SUBCUTANEOUS | Status: AC
  Filled 2014-08-02: qty 0.6

## 2014-08-02 MED ORDER — ONDANSETRON HCL 4 MG PO TABS
ORAL_TABLET | ORAL | Status: AC
  Filled 2014-08-02: qty 2

## 2014-08-02 MED ORDER — LORAZEPAM 2 MG/ML IJ SOLN
INTRAMUSCULAR | Status: AC
  Filled 2014-08-02: qty 1

## 2014-08-02 MED ORDER — HEPARIN SOD (PORK) LOCK FLUSH 100 UNIT/ML IV SOLN
500.0000 [IU] | Freq: Once | INTRAVENOUS | Status: AC
  Administered 2014-08-02: 500 [IU] via INTRAVENOUS
  Filled 2014-08-02: qty 5

## 2014-08-02 MED ORDER — DEXAMETHASONE SODIUM PHOSPHATE 20 MG/5ML IJ SOLN: 12.0000 mg | Freq: Once | INTRAMUSCULAR | Status: DC

## 2014-08-02 MED ORDER — LORAZEPAM 2 MG/ML IJ SOLN
0.5000 mg | Freq: Once | INTRAMUSCULAR | Status: AC
  Administered 2014-08-02: 0.5 mg via INTRAVENOUS

## 2014-08-02 MED ORDER — ONDANSETRON HCL 4 MG PO TABS
8.0000 mg | ORAL_TABLET | Freq: Once | ORAL | Status: AC
  Administered 2014-08-02: 8 mg via ORAL

## 2014-08-02 NOTE — Progress Notes (Signed)
No PCP Per Patient No address on file  ESRD s/p kidney transplant   Peritoneal carcinomatosis seen on imaging performed due to abdominal pain 06/16/2014 at [facility]  Paracentesis by IR which returned high grade malignant neoplasm, favor lymphoma. exploratory lap and biopsies on 06/25/14 and path favored Burkitt's lymphoma   FISH from her omental biopsy returned negative for MYC, BCL2, BCL6 ruling out Burkitt's. 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   Bone marrow biopsy on 12/18 which revealed Normocellular marrow with trilineage hematopoiesis and maturation. Mild CD20 positive interstitial B-cell lymphocytosis. HIV and EBV were negative.   CT Chest on 12/17 showed a small poorly defined collection of soft tissue/fluid in the caudal middle mediastinum adjacent to the distal esophagus.  ECHO 12/18 prior to chemo showed EF 60-65%.   Cycle 1 of R-EPOCH with ppx IT MTX given the aggressive nature of her disease.   CURRENT THERAPY: R-EPOCH and IT MTX at [place]: Erica Woodward 69 y.o. female returns for DLBCL. She is currently receiving R-EPOCH.  She has had difficulty with refractory nausea and does ok when she takes her nausea medications on a schedule.  She did not take any this morning.  She is complaining of loss of hearing.  She states she can no longer hear anyone on her cell phone. Family reports she has to turn up the TV loud, and they notice they have to speak very loudly.  She is also complaining of ear pain, that is getting worse as well as headaches.  She states her pain is not improved with pain medication.   She uses a walker, but is up daily. She gets dressed daily. Her appetite is up and down.   MEDICAL HISTORY: Past Medical History  Diagnosis Date  . Renal disorder     kidney transplant 2005  . Hypertension   . Cancer     carcinomatosis  . GERD (gastroesophageal reflux disease)     has HLD (hyperlipidemia); GOUT;  ACUTE SEROUS OTITIS MEDIA; OTITIS MEDIA, ACUTE WITH RUPTURE OF TYMPANIC MEMBRANE; Essential hypertension; KNEE INJURY, RIGHT; KIDNEY TRANSPLANTATION, HX OF; Abdominal pain; Carcinomatosis; GERD (gastroesophageal reflux disease); and NHL (non-Hodgkin's lymphoma) on her problem list.     No history exists.     has No Known Allergies.  We administered ondansetron, pegfilgrastim, and sodium chloride.  SURGICAL HISTORY: Past Surgical History  Procedure Laterality Date  . Kidney transplant    . Portacath placement     Kidney Transplant in 2005 at [place] donor transplant  SOCIAL HISTORY: History   Social History  . Marital Status: Married    Spouse Name: N/A    Number of Children: N/A  . Years of Education: N/A   Occupational History  . Not on file.   Social History Main Topics  . Smoking status: Never Smoker   . Smokeless tobacco: Not on file  . Alcohol Use: No  . Drug Use: Not on file  . Sexual Activity: Not on file   Other Topics Concern  . Not on file   Social History Narrative   She is living with one of her daughters. 4 children. 5 grandchildren.  Divorced. Smoked only briefly in the past. Worked in a factory.  FAMILY HISTORY: Family History  Problem Relation Age of Onset  . Kidney disease Father   . Lung cancer Brother   . Seizures Brother   . Seizures Sister    Mother deceased late  56'C from complications of pneumonia Father died in an MVA at a young age 75 had lung cancer, died at age 16   Review of Systems  Constitutional: Positive for weight loss and malaise/fatigue.  HENT: Positive for ear pain, hearing loss and tinnitus. Negative for congestion, ear discharge, nosebleeds and sore throat.   Eyes: Negative for blurred vision, double vision, photophobia, pain, discharge and redness.       Wears glasses  Respiratory: Negative.  Negative for stridor.   Cardiovascular: Negative.   Gastrointestinal: Positive for nausea and vomiting. Negative  for heartburn, abdominal pain, diarrhea, constipation, blood in stool and melena.  Genitourinary: Positive for urgency and frequency. Negative for dysuria, hematuria and flank pain.  Musculoskeletal: Positive for neck pain. Negative for myalgias.  Skin: Negative.   Neurological: Positive for headaches. Negative for dizziness, tingling, tremors, sensory change, speech change, focal weakness, seizures and loss of consciousness.  Endo/Heme/Allergies: Negative.   Psychiatric/Behavioral: Negative.     PHYSICAL EXAMINATION  ECOG PERFORMANCE STATUS: 1 - Symptomatic but completely ambulatory  Filed Vitals:   08/02/14 1109  BP: 112/77  Pulse: 105  Temp: 97.7 F (36.5 C)  Resp: 18    Physical Exam  Constitutional: She is oriented to person, place, and time. No distress.  Alopecia  HENT:  Head: Normocephalic and atraumatic.  Nose: Nose normal.  Mouth/Throat: Oropharynx is clear and moist. No oropharyngeal exudate.  TM's visualized bilaterally and clear.    Eyes: Conjunctivae and EOM are normal. Pupils are equal, round, and reactive to light. Right eye exhibits no discharge. Left eye exhibits no discharge. No scleral icterus.  Neck: Normal range of motion. Neck supple. No tracheal deviation present. No thyromegaly present.  Cardiovascular: Normal rate, regular rhythm and normal heart sounds.  Exam reveals no gallop and no friction rub.   No murmur heard. Pulmonary/Chest: Effort normal and breath sounds normal. She has no wheezes. She has no rales.  Abdominal: Soft. Bowel sounds are normal. She exhibits no distension and no mass. There is no tenderness. There is no rebound and no guarding.  Musculoskeletal: Normal range of motion. She exhibits no edema.  Lymphadenopathy:    She has no cervical adenopathy.  Neurological: She is alert and oriented to person, place, and time. She has normal reflexes. No cranial nerve deficit. Gait normal. Coordination normal.  Skin: Skin is warm and dry. No  rash noted. She is not diaphoretic.  Psychiatric: Mood, memory, affect and judgment normal.  Nursing note and vitals reviewed.   LABORATORY DATA:  CBC    Component Value Date/Time   WBC 5.2 08/02/2014 1240   RBC 2.61* 08/02/2014 1240   HGB 8.1* 08/02/2014 1240   HCT 25.4* 08/02/2014 1240   PLT 166 08/02/2014 1240   MCV 97.3 08/02/2014 1240   MCH 31.0 08/02/2014 1240   MCHC 31.9 08/02/2014 1240   RDW 16.4* 08/02/2014 1240   LYMPHSABS 0.3* 08/02/2014 1240   MONOABS 0.0* 08/02/2014 1240   EOSABS 0.1 08/02/2014 1240   BASOSABS 0.0 08/02/2014 1240   CMP     Component Value Date/Time   NA 137 07/12/2014 1112   K 3.5 07/12/2014 1112   CL 108 07/12/2014 1112   CO2 23 07/12/2014 1112   GLUCOSE 115* 07/12/2014 1112   BUN 7 07/12/2014 1112   CREATININE 0.56 07/12/2014 1112   CALCIUM 8.1* 07/12/2014 1112   PROT 5.1* 07/12/2014 1112   ALBUMIN 2.9* 07/12/2014 1112   AST 14 07/12/2014 1112   ALT 17 07/12/2014  1112   ALKPHOS 61 07/12/2014 1112   BILITOT 0.8 07/12/2014 1112   GFRNONAA >90 07/12/2014 1112   GFRAA >90 07/12/2014 1112     ASSESSMENT and THERAPY PLAN:    NHL (non-Hodgkin's lymphoma) Pleasant 69 year old female with diffuse large B-cell lymphoma, high-grade, Ki-67 of 100%, who presented with peritoneal carcinomatosis. She is being treated at [facility] with RE Pok and intrathecal methotrexate. Her major complaints revolve around nausea, although it is controlled if she remembers to take her antiemetics on a schedule.  Other significant concerns involved hearing loss, she states she cannot hear people talking on her cell phone any longer. Family also notes they have to yell to get her to understand them. She is reporting worsening headaches.  We will continue to monitor labs for the patient as requested by [facility]. She is due for Neulasta today and we have agreed to give that to her here. In regards to her above symptoms, we have given her nausea medication in the clinic  and I have strongly emphasized to her the importance of taking her oral anti-emetics on a schedule. I suspect her hearing issues are chemotherapy related and I have advised her to discuss this with her treating physician when she follows up at [facility] on February 1.  I have advised the patient and her daughter to let us know if we can be of any assistance prior to her next admission to [facility]. There requested reading information on diffuse large cell lymphoma and we provided them with information from up to date.     All questions were answered. The patient knows to call the clinic with any problems, questions or concerns. We can certainly see the patient much sooner if necessary.  Molli Hazard 08/02/2014

## 2014-08-02 NOTE — Progress Notes (Signed)
LABS FOR MG,CBCD,CMP DRAWN BY RN.

## 2014-08-02 NOTE — Patient Instructions (Signed)
[place] at [facility]  Discharge Instructions:  Come in or call for refractory nausea or vomiting Go to the ED with fever as noted below. Please let us know if we can be of assistance [place] TO REPORT AS SOON AS POSSIBLE AFTER TREATMENT:  FEVER GREATER THAN 100.5 F  CHILLS WITH OR WITHOUT FEVER  NAUSEA AND VOMITING THAT IS NOT CONTROLLED WITH YOUR NAUSEA MEDICATION  UNUSUAL SHORTNESS OF BREATH  UNUSUAL BRUISING OR BLEEDING  TENDERNESS IN MOUTH AND THROAT WITH OR WITHOUT PRESENCE OF ULCERS  URINARY PROBLEMS  BOWEL PROBLEMS  UNUSUAL RASH    _______________________________________________________________  Thank you for choosing [place] at [facility] to provide your oncology and hematology care.  To afford each patient quality time with our providers, please arrive at least 15 minutes before your scheduled appointment.  You need to re-schedule your appointment if you arrive 10 or more minutes late.  We strive to give you quality time with our providers, and arriving late affects you and other patients whose appointments are after yours.  Also, if you no show three or more times for appointments you may be dismissed from the clinic.  Again, thank you for choosing [place] at [place] hope is that these requests will allow you access to exceptional care and in a timely manner. _______________________________________________________________  If you have questions after your visit, please contact our office at [phone] between the hours of 8:30 a.m. and 5:00 p.m. Voicemails left after 4:30 p.m. will not be returned until the following business day. _______________________________________________________________  For prescription refill requests, have your pharmacy contact our  office. _______________________________________________________________  Recommendations made by the consultant and any test results will be sent to your referring physician. _______________________________________________________________

## 2014-08-02 NOTE — Patient Instructions (Signed)
..  [place] at [facility] Discharge Instructions  RECOMMENDATIONS MADE BY THE CONSULTANT AND ANY TEST RESULTS WILL BE SENT TO YOUR REFERRING PHYSICIAN.  Dr. Whitney Muse visit today IV fluids and meds given today for nausea  Thank you for choosing [place] at [facility] to provide your oncology and hematology care.  To afford each patient quality time with our provider, please arrive at least 15 minutes before your scheduled appointment time.    You need to re-schedule your appointment should you arrive 10 or more minutes late.  We strive to give you quality time with our providers, and arriving late affects you and other patients whose appointments are after yours.  Also, if you no show three or more times for appointments you may be dismissed from the clinic at the providers discretion.     Again, thank you for choosing [facility].  Our hope is that these requests will decrease the amount of time that you wait before being seen by our physicians.       _____________________________________________________________  Should you have questions after your visit to [facility], please contact our office at [phone] between the hours of 8:30 a.m. and 4:30 p.m.  Voicemails left after 4:30 p.m. will not be returned until the following business day.  For prescription refill requests, have your pharmacy contact our office.

## 2014-08-02 NOTE — Progress Notes (Signed)
Erica Woodward presents today for injection per MD orders. Neulasta 6mg  administered SQ in right Abdomen. Administration without incident. Patient tolerated well.

## 2014-08-02 NOTE — Assessment & Plan Note (Signed)
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 She is being treated at [facility] with RE Pok and intrathecal methotrexate. Her major complaints revolve around nausea, although it is controlled if she remembers to take her antiemetics on a schedule.  Other significant concerns involved hearing loss, she states she cannot hear people talking on her cell phone any longer. Family also notes they have to yell to get her to understand them. She is reporting worsening headaches.  We will continue to monitor labs for the patient as requested by [facility]. She is due for Neulasta today and we have agreed to give that to her here. In regards to her above symptoms, we have given her nausea medication in the clinic and I have strongly emphasized to her the importance of taking her oral anti-emetics on a schedule. I suspect her hearing issues are chemotherapy related and I have advised her to discuss this with her treating physician when she follows up at [facility] on February 1.  I have advised the patient and her daughter to let us know if we can be of any assistance prior to her next admission to [facility]. There requested reading information on diffuse large cell lymphoma and we provided them with information from up to date.

## 2014-08-02 NOTE — Progress Notes (Signed)
Patient tolerated ivf's well.  Stated she was feeling much better.   Portacath flushed with 12ml NS and 500U/76ml Heparin and needle removed intact.  Procedure tolerated well and without incident.  Will return in the am for blood transfusion.

## 2014-08-03 ENCOUNTER — Encounter (HOSPITAL_BASED_OUTPATIENT_CLINIC_OR_DEPARTMENT_OTHER): Payer: Medicare Other

## 2014-08-03 VITALS — BP 139/73 | HR 93 | Temp 98.4°F | Resp 18

## 2014-08-03 DIAGNOSIS — C800 Disseminated malignant neoplasm, unspecified: Secondary | ICD-10-CM | POA: Diagnosis not present

## 2014-08-03 DIAGNOSIS — C8330 Diffuse large B-cell lymphoma, unspecified site: Secondary | ICD-10-CM

## 2014-08-03 DIAGNOSIS — C8590 Non-Hodgkin lymphoma, unspecified, unspecified site: Secondary | ICD-10-CM

## 2014-08-03 MED ORDER — ACETAMINOPHEN 325 MG PO TABS
650.0000 mg | ORAL_TABLET | Freq: Once | ORAL | Status: AC
  Administered 2014-08-03: 650 mg via ORAL

## 2014-08-03 MED ORDER — SODIUM CHLORIDE 0.9 % IV SOLN
250.0000 mL | Freq: Once | INTRAVENOUS | Status: AC
  Administered 2014-08-03: 250 mL via INTRAVENOUS

## 2014-08-03 MED ORDER — DIPHENHYDRAMINE HCL 50 MG/ML IJ SOLN
25.0000 mg | Freq: Once | INTRAMUSCULAR | Status: AC
  Administered 2014-08-03: 25 mg via INTRAVENOUS
  Filled 2014-08-03: qty 1

## 2014-08-03 MED ORDER — SODIUM CHLORIDE 0.9 % IJ SOLN: 10.0000 mL | INTRAMUSCULAR | Status: DC | PRN

## 2014-08-03 MED ORDER — DIPHENHYDRAMINE HCL 25 MG PO CAPS
ORAL_CAPSULE | ORAL | Status: AC
  Filled 2014-08-03: qty 1

## 2014-08-03 MED ORDER — ACETAMINOPHEN 325 MG PO TABS
ORAL_TABLET | ORAL | Status: AC
  Filled 2014-08-03: qty 2

## 2014-08-03 MED ORDER — HEPARIN SOD (PORK) LOCK FLUSH 100 UNIT/ML IV SOLN
500.0000 [IU] | Freq: Every day | INTRAVENOUS | Status: AC | PRN
  Administered 2014-08-03: 500 [IU]

## 2014-08-03 NOTE — Patient Instructions (Signed)
[place] at [facility] Discharge Instructions  RECOMMENDATIONS MADE BY THE CONSULTANT AND ANY TEST RESULTS WILL BE SENT TO YOUR REFERRING PHYSICIAN.  Today you received 2 units of red blood cells. Please follow up as needed.  Thank you for choosing [place] at [facility] to provide your oncology and hematology care.  To afford each patient quality time with our provider, please arrive at least 15 minutes before your scheduled appointment time.    You need to re-schedule your appointment should you arrive 10 or more minutes late.  We strive to give you quality time with our providers, and arriving late affects you and other patients whose appointments are after yours.  Also, if you no show three or more times for appointments you may be dismissed from the clinic at the providers discretion.     Again, thank you for choosing [facility].  Our hope is that these requests will decrease the amount of time that you wait before being seen by our physicians.       _____________________________________________________________  Should you have questions after your visit to [facility], please contact our office at [phone] between the hours of 8:30 a.m. and 4:30 p.m.  Voicemails left after 4:30 p.m. will not be returned until the following business day.  For prescription refill requests, have your pharmacy contact our office.

## 2014-08-03 NOTE — Progress Notes (Signed)
Patient tolerated 2 units of packed blood cells well. Portacath flushed with 54ml NS and 500U/38ml Heparin and needle removed intact.  Procedure tolerated well and without incident.

## 2014-08-04 LAB — TYPE AND SCREEN
ABO/RH(D): O POS
Antibody Screen: NEGATIVE
UNIT DIVISION: 0
Unit division: 0

## 2014-08-09 ENCOUNTER — Telehealth (HOSPITAL_COMMUNITY): Payer: Self-pay | Admitting: *Deleted

## 2014-08-09 NOTE — Telephone Encounter (Signed)
Home Health nurse called from the patients house. No fever, temp 98.9. Nurse reports that patient has drink one small bottle gatorade and one bottle water. Encouraged patient to push fluids. Home health drawing labs and will fax to [place] in am.

## 2014-08-09 NOTE — Telephone Encounter (Signed)
Call from patients daughter, Ronica is dizzy, legs hurt, chills. No fever. Home health nurse due there within the next hour and I have asked Letta Median to have the nurse call us when she gets there to better assess the situation. I have notified her in the mean time to come to ED immediately if she develops fever. Verbalized understanding.

## 2014-08-18 ENCOUNTER — Other Ambulatory Visit (HOSPITAL_COMMUNITY): Payer: Self-pay | Admitting: Oncology

## 2014-08-18 ENCOUNTER — Encounter: Payer: Self-pay | Admitting: Hematology & Oncology

## 2014-08-18 DIAGNOSIS — C8590 Non-Hodgkin lymphoma, unspecified, unspecified site: Secondary | ICD-10-CM

## 2014-08-19 HISTORY — PX: LUMBAR PUNCTURE W/ INTRATHECAL CHEMOTHERAPY: SHX1986

## 2014-08-23 ENCOUNTER — Encounter (HOSPITAL_COMMUNITY): Payer: Medicare Other

## 2014-08-23 ENCOUNTER — Encounter (HOSPITAL_COMMUNITY): Payer: Medicare Other | Attending: Hematology & Oncology | Admitting: Hematology & Oncology

## 2014-08-23 ENCOUNTER — Encounter (HOSPITAL_COMMUNITY): Payer: Self-pay | Admitting: Hematology & Oncology

## 2014-08-23 DIAGNOSIS — C8590 Non-Hodgkin lymphoma, unspecified, unspecified site: Secondary | ICD-10-CM

## 2014-08-23 DIAGNOSIS — C8330 Diffuse large B-cell lymphoma, unspecified site: Secondary | ICD-10-CM

## 2014-08-23 DIAGNOSIS — C800 Disseminated malignant neoplasm, unspecified: Secondary | ICD-10-CM | POA: Insufficient documentation

## 2014-08-23 MED ORDER — PEGFILGRASTIM INJECTION 6 MG/0.6ML ~~LOC~~
PREFILLED_SYRINGE | SUBCUTANEOUS | Status: AC
  Filled 2014-08-23: qty 0.6

## 2014-08-23 MED ORDER — PEGFILGRASTIM INJECTION 6 MG/0.6ML ~~LOC~~
6.0000 mg | PREFILLED_SYRINGE | Freq: Once | SUBCUTANEOUS | Status: AC
  Administered 2014-08-23: 6 mg via SUBCUTANEOUS

## 2014-08-23 NOTE — Progress Notes (Signed)
No PCP Per Patient No address on file  ESRD s/p kidney transplant   Peritoneal carcinomatosis seen on imaging performed due to abdominal pain 06/16/2014 at [facility]  Paracentesis by IR which returned high grade malignant neoplasm, favor lymphoma. exploratory lap and biopsies on 06/25/14 and path favored Burkitt's lymphoma   FISH from her omental biopsy returned negative for MYC, BCL2, BCL6 ruling out Burkitt's. 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   Bone marrow biopsy on 12/18 which revealed Normocellular marrow with trilineage hematopoiesis and maturation. Mild CD20 positive interstitial B-cell lymphocytosis. HIV and EBV were negative.   CT Chest on 12/17 showed a small poorly defined collection of soft tissue/fluid in the caudal middle mediastinum adjacent to the distal esophagus.  ECHO 12/18 prior to chemo showed EF 60-65%.   Cycle 1 of R-EPOCH with ppx IT MTX given the aggressive nature of her disease.   CURRENT THERAPY: R-EPOCH and IT MTX at [place]: Erica Woodward 69 y.o. female returns for DLBCL. She is currently receiving R-EPOCH.  She has also received IT MTX.  She is doing better.  She still has episodes of nausea but improved from prior.  She has had diarrhea today. She is eating fairly well. She underwent and audiology exam in regards to her hearing.  She has an appointment with ENT.   She states she feels stronger now than when she started therapy. She walked into the clinic today.   MEDICAL HISTORY: Past Medical History  Diagnosis Date  . Renal disorder     kidney transplant 2005  . Hypertension   . Cancer     carcinomatosis  . GERD (gastroesophageal reflux disease)     has HLD (hyperlipidemia); GOUT; ACUTE SEROUS OTITIS MEDIA; OTITIS MEDIA, ACUTE WITH RUPTURE OF TYMPANIC MEMBRANE; Essential hypertension; KNEE INJURY, RIGHT; KIDNEY TRANSPLANTATION, HX OF; Abdominal pain; Carcinomatosis; GERD (gastroesophageal  reflux disease); and NHL (non-Hodgkin's lymphoma) on her problem list.     No history exists.     has No Known Allergies.  We administered pegfilgrastim.  SURGICAL HISTORY: Past Surgical History  Procedure Laterality Date  . Kidney transplant    . Portacath placement    . Lumbar puncture w/ intrathecal chemotherapy  08/19/14   Kidney Transplant in 2005 at [place] donor transplant  SOCIAL HISTORY: History   Social History  . Marital Status: Married    Spouse Name: N/A    Number of Children: N/A  . Years of Education: N/A   Occupational History  . Not on file.   Social History Main Topics  . Smoking status: Never Smoker   . Smokeless tobacco: Never Used  . Alcohol Use: No  . Drug Use: Not on file  . Sexual Activity: Not on file   Other Topics Concern  . Not on file   Social History Narrative   She is living with one of her daughters. 4 children. 5 grandchildren.  Divorced. Smoked only briefly in the past. Worked in a factory.  FAMILY HISTORY: Family History  Problem Relation Age of Onset  . Kidney disease Father   . Lung cancer Brother   . Seizures Brother   . Seizures Sister    Mother deceased late 85'I from complications of pneumonia Father died in an MVA at a young age 30 had lung cancer, died at age 46   Review of Systems  Constitutional: Positive for malaise/fatigue. Negative for fever, chills, weight loss and diaphoresis.  HENT: Positive for hearing loss. Negative for congestion, ear discharge, ear pain, nosebleeds, sore throat and tinnitus.   Eyes: Negative.   Respiratory: Negative for stridor.   Cardiovascular: Negative.   Gastrointestinal: Positive for diarrhea. Negative for abdominal pain, constipation, blood in stool and melena.  Genitourinary: Negative.   Musculoskeletal: Positive for joint pain.  Skin: Negative.   Neurological: Positive for weakness. Negative for dizziness, tingling, tremors, sensory change, speech change, focal  weakness, seizures, loss of consciousness and headaches.  Endo/Heme/Allergies: Negative.   Psychiatric/Behavioral: Negative.     PHYSICAL EXAMINATION  ECOG PERFORMANCE STATUS: 1 - Symptomatic but completely ambulatory  Filed Vitals:   08/23/14 1100  BP: 112/68  Pulse: 115  Temp: 98.1 F (36.7 C)  Resp: 16    Physical Exam  Constitutional: She is oriented to person, place, and time. No distress.  Alopecia  HENT:  Head: Normocephalic and atraumatic.  Nose: Nose normal.  Mouth/Throat: Oropharynx is clear and moist. No oropharyngeal exudate.  TM's visualized bilaterally and clear.    Eyes: Conjunctivae and EOM are normal. Pupils are equal, round, and reactive to light. Right eye exhibits no discharge. Left eye exhibits no discharge. No scleral icterus.  Neck: Normal range of motion. Neck supple. No tracheal deviation present. No thyromegaly present.  Cardiovascular: Normal rate, regular rhythm and normal heart sounds.  Exam reveals no gallop and no friction rub.   No murmur heard. Pulmonary/Chest: Effort normal and breath sounds normal. She has no wheezes. She has no rales.  Abdominal: Soft. Bowel sounds are normal. She exhibits no distension and no mass. There is no tenderness. There is no rebound and no guarding.  Musculoskeletal: Normal range of motion. She exhibits no edema.  Lymphadenopathy:    She has no cervical adenopathy.  Neurological: She is alert and oriented to person, place, and time. She has normal reflexes. No cranial nerve deficit. Gait normal. Coordination normal.  Skin: Skin is warm and dry. No rash noted. She is not diaphoretic.  Psychiatric: Mood, memory, affect and judgment normal.  Nursing note and vitals reviewed.   LABORATORY DATA:  CBC    Component Value Date/Time   WBC 5.2 08/02/2014 1240   RBC 2.61* 08/02/2014 1240   HGB 8.1* 08/02/2014 1240   HCT 25.4* 08/02/2014 1240   PLT 166 08/02/2014 1240   MCV 97.3 08/02/2014 1240   MCH 31.0  08/02/2014 1240   MCHC 31.9 08/02/2014 1240   RDW 16.4* 08/02/2014 1240   LYMPHSABS 0.3* 08/02/2014 1240   MONOABS 0.0* 08/02/2014 1240   EOSABS 0.1 08/02/2014 1240   BASOSABS 0.0 08/02/2014 1240   CMP     Component Value Date/Time   NA 137 08/02/2014 1240   K 3.7 08/02/2014 1240   CL 106 08/02/2014 1240   CO2 26 08/02/2014 1240   GLUCOSE 108* 08/02/2014 1240   BUN 20 08/02/2014 1240   CREATININE 0.66 08/02/2014 1240   CALCIUM 8.3* 08/02/2014 1240   PROT 5.1* 08/02/2014 1240   ALBUMIN 3.0* 08/02/2014 1240   AST 11 08/02/2014 1240   ALT 13 08/02/2014 1240   ALKPHOS 49 08/02/2014 1240   BILITOT 0.7 08/02/2014 1240   GFRNONAA 89* 08/02/2014 1240   GFRAA >90 08/02/2014 1240     ASSESSMENT and THERAPY PLAN:    NHL (non-Hodgkin's lymphoma) 69 year old female with NHL on R-EPOCH and IT MTX. She is doing better after this last treatment that prior cycles.  She is here for neulasta today. She has no major  complaints other than diarrhea this am  Home health has been doing her labs at home. She is due for another cycle of R-EPOCH on the 22nd. I advised the patient and her daughter that if we can be of assistance to let us know.  We will see her back in 6 weeks. She will continue neulasta as ordered.     All questions were answered. The patient knows to call the clinic with any problems, questions or concerns. We can certainly see the patient much sooner if necessary.  Molli Hazard 08/23/2014

## 2014-08-23 NOTE — Progress Notes (Signed)
Erica Woodward presents today for injection per MD orders. Neulasta 6mg  administered SQ in left Abdomen. Administration without incident. Patient tolerated well.

## 2014-08-23 NOTE — Progress Notes (Signed)
Please see office visit for more information

## 2014-08-23 NOTE — Assessment & Plan Note (Signed)
69 year old female with NHL on R-EPOCH and IT MTX. She is doing better after this last treatment that prior cycles.  She is here for neulasta today. She has no major complaints other than diarrhea this am  Home health has been doing her labs at home. She is due for another cycle of R-EPOCH on the 22nd. I advised the patient and her daughter that if we can be of assistance to let us know.  We will see her back in 6 weeks. She will continue neulasta as ordered.

## 2014-08-23 NOTE — Patient Instructions (Signed)
[place] at [facility]  Discharge Instructions:  Exam and discussion by MD  Imodium 2 tablets/capsules after 1st loose stool. Then 1 tablet every 2 hours until you go a total of 12 hours without having a loose stool. Report fevers, uncontrolled nausea, vomiting or diarrhea.  Follow-up with MD in 6 weeks. _______________________________________________________________  Thank you for choosing [place] at [facility] to provide your oncology and hematology care.  To afford each patient quality time with our providers, please arrive at least 15 minutes before your scheduled appointment.  You need to re-schedule your appointment if you arrive 10 or more minutes late.  We strive to give you quality time with our providers, and arriving late affects you and other patients whose appointments are after yours.  Also, if you no show three or more times for appointments you may be dismissed from the clinic.  Again, thank you for choosing [place] at [place] hope is that these requests will allow you access to exceptional care and in a timely manner. _______________________________________________________________  If you have questions after your visit, please contact our office at [phone] between the hours of 8:30 a.m. and 5:00 p.m. Voicemails left after 4:30 p.m. will not be returned until the following business day. _______________________________________________________________  For prescription refill requests, have your pharmacy contact our office. _______________________________________________________________  Recommendations made by the consultant and any test results will be sent to your referring physician. _______________________________________________________________

## 2014-09-13 ENCOUNTER — Encounter (HOSPITAL_COMMUNITY): Payer: Medicare Other

## 2014-09-13 ENCOUNTER — Encounter (HOSPITAL_COMMUNITY): Payer: Self-pay

## 2014-09-13 ENCOUNTER — Encounter (HOSPITAL_BASED_OUTPATIENT_CLINIC_OR_DEPARTMENT_OTHER): Payer: Medicare Other

## 2014-09-13 DIAGNOSIS — C8330 Diffuse large B-cell lymphoma, unspecified site: Secondary | ICD-10-CM

## 2014-09-13 DIAGNOSIS — Z5189 Encounter for other specified aftercare: Secondary | ICD-10-CM

## 2014-09-13 DIAGNOSIS — C8590 Non-Hodgkin lymphoma, unspecified, unspecified site: Secondary | ICD-10-CM

## 2014-09-13 DIAGNOSIS — C800 Disseminated malignant neoplasm, unspecified: Secondary | ICD-10-CM

## 2014-09-13 LAB — COMPREHENSIVE METABOLIC PANEL
ALBUMIN: 2.9 g/dL — AB (ref 3.5–5.2)
ALK PHOS: 51 U/L (ref 39–117)
ALT: 14 U/L (ref 0–35)
AST: 20 U/L (ref 0–37)
Anion gap: 6 (ref 5–15)
BUN: 16 mg/dL (ref 6–23)
CALCIUM: 8.3 mg/dL — AB (ref 8.4–10.5)
CO2: 23 mmol/L (ref 19–32)
Chloride: 108 mmol/L (ref 96–112)
Creatinine, Ser: 0.69 mg/dL (ref 0.50–1.10)
GFR calc Af Amer: >90 mL/min (ref >90)
GFR calc non Af Amer: 87 mL/min — ABNORMAL LOW (ref >90)
Glucose, Bld: 156 mg/dL — ABNORMAL HIGH (ref 70–99)
POTASSIUM: 3.9 mmol/L (ref 3.5–5.1)
SODIUM: 137 mmol/L (ref 135–145)
Total Bilirubin: 0.5 mg/dL (ref 0.3–1.2)
Total Protein: 5.3 g/dL — ABNORMAL LOW (ref 6.0–8.3)

## 2014-09-13 LAB — CBC WITH DIFFERENTIAL/PLATELET
Basophils Absolute: 0 10*3/uL (ref 0.0–0.1)
Basophils Relative: 0 % (ref 0–1)
Eosinophils Absolute: 0 10*3/uL (ref 0.0–0.7)
Eosinophils Relative: 0 % (ref 0–5)
HCT: 27.2 % — ABNORMAL LOW (ref 36.0–46.0)
Hemoglobin: 8.8 g/dL — ABNORMAL LOW (ref 12.0–15.0)
LYMPHS PCT: 2 % — AB (ref 12–46)
Lymphs Abs: 0.1 10*3/uL — ABNORMAL LOW (ref 0.7–4.0)
MCH: 32.1 pg (ref 26.0–34.0)
MCHC: 32.4 g/dL (ref 30.0–36.0)
MCV: 99.3 fL (ref 78.0–100.0)
Monocytes Absolute: 0 10*3/uL — ABNORMAL LOW (ref 0.1–1.0)
Monocytes Relative: 0 % — ABNORMAL LOW (ref 3–12)
NEUTROS ABS: 6.6 10*3/uL (ref 1.7–7.7)
NEUTROS PCT: 98 % — AB (ref 43–77)
Platelets: 167 10*3/uL (ref 150–400)
RBC: 2.74 MIL/uL — ABNORMAL LOW (ref 3.87–5.11)
RDW: 19.6 % — AB (ref 11.5–15.5)
WBC: 6.8 10*3/uL (ref 4.0–10.5)

## 2014-09-13 LAB — MAGNESIUM: Magnesium: 1.9 mg/dL (ref 1.5–2.5)

## 2014-09-13 MED ORDER — PEGFILGRASTIM INJECTION 6 MG/0.6ML ~~LOC~~
6.0000 mg | PREFILLED_SYRINGE | Freq: Once | SUBCUTANEOUS | Status: AC
  Administered 2014-09-13: 6 mg via SUBCUTANEOUS

## 2014-09-13 MED ORDER — HEPARIN SOD (PORK) LOCK FLUSH 100 UNIT/ML IV SOLN
500.0000 [IU] | Freq: Once | INTRAVENOUS | Status: AC
  Administered 2014-09-13: 500 [IU] via INTRAVENOUS

## 2014-09-13 MED ORDER — SODIUM CHLORIDE 0.9 % IJ SOLN
10.0000 mL | INTRAMUSCULAR | Status: DC | PRN
  Administered 2014-09-13: 10 mL via INTRAVENOUS
  Filled 2014-09-13: qty 10

## 2014-09-13 MED ORDER — HEPARIN SOD (PORK) LOCK FLUSH 100 UNIT/ML IV SOLN
INTRAVENOUS | Status: AC
  Filled 2014-09-13: qty 5

## 2014-09-13 NOTE — Progress Notes (Signed)
Erica Woodward presented for Portacath access and flush.  Proper placement of portacath confirmed by CXR.  Portacath located right chest wall accessed with  H 20 needle.  No blood return and flushed easily Portacath flushed with 72ml NS and 500U/56ml Heparin and needle removed intact.  Procedure tolerated well and without incident.     Erica Woodward's reason for visit today is for an injection as scheduled per MD orders.     Erica Woodward also received neulasta 6 mg per MD orders; see MAR for administration details.  Erica Woodward tolerated all procedures well and without incident; questions were answered and patient was discharged.   Erica Woodward's reason for visit today is for labs as scheduled per MD orders.  Venipuncture performed with a 23 gauge butterfly needle to L Antecubital.  Erica Woodward tolerated procedure well and without incident; questions were answered and patient was discharged.

## 2014-09-13 NOTE — Patient Instructions (Signed)
[place] at [facility]  Discharge Instructions:  Here for lab draw and port flush and neulasta injection.  We will fax your lab work tomorrow.  Please call the clinic if you have any questions or concerns _______________________________________________________________  Thank you for choosing [place] at [facility] to provide your oncology and hematology care.  To afford each patient quality time with our providers, please arrive at least 15 minutes before your scheduled appointment.  You need to re-schedule your appointment if you arrive 10 or more minutes late.  We strive to give you quality time with our providers, and arriving late affects you and other patients whose appointments are after yours.  Also, if you no show three or more times for appointments you may be dismissed from the clinic.  Again, thank you for choosing [place] at [place] hope is that these requests will allow you access to exceptional care and in a timely manner. _______________________________________________________________  If you have questions after your visit, please contact our office at [phone] between the hours of 8:30 a.m. and 5:00 p.m. Voicemails left after 4:30 p.m. will not be returned until the following business day. _______________________________________________________________  For prescription refill requests, have your pharmacy contact our office. _______________________________________________________________  Recommendations made by the consultant and any test results will be sent to your referring physician. _______________________________________________________________

## 2014-09-14 ENCOUNTER — Other Ambulatory Visit (HOSPITAL_COMMUNITY): Payer: Self-pay | Admitting: Oncology

## 2014-09-14 DIAGNOSIS — C8590 Non-Hodgkin lymphoma, unspecified, unspecified site: Secondary | ICD-10-CM

## 2014-09-14 NOTE — Addendum Note (Signed)
Addended by: Elenor Legato on: 09/14/2014 12:17 PM   Modules accepted: Orders

## 2014-09-14 NOTE — Progress Notes (Signed)
Spoke with daughter Letta Median to schedule blood tranfusion for tomorrow at 8:45 am.  Hemoglobin 8.8

## 2014-09-15 ENCOUNTER — Encounter (HOSPITAL_COMMUNITY): Payer: Medicare Other

## 2014-09-15 ENCOUNTER — Encounter (HOSPITAL_COMMUNITY): Payer: Medicare Other | Attending: Hematology & Oncology

## 2014-09-15 DIAGNOSIS — C8330 Diffuse large B-cell lymphoma, unspecified site: Secondary | ICD-10-CM

## 2014-09-15 DIAGNOSIS — C800 Disseminated malignant neoplasm, unspecified: Secondary | ICD-10-CM | POA: Insufficient documentation

## 2014-09-15 LAB — PREPARE RBC (CROSSMATCH)

## 2014-09-15 NOTE — Progress Notes (Signed)
LABS FOR [facility]

## 2014-09-16 ENCOUNTER — Encounter (HOSPITAL_COMMUNITY): Payer: Self-pay

## 2014-09-16 ENCOUNTER — Encounter (HOSPITAL_COMMUNITY): Payer: Medicare Other

## 2014-09-16 ENCOUNTER — Encounter (HOSPITAL_BASED_OUTPATIENT_CLINIC_OR_DEPARTMENT_OTHER): Payer: Medicare Other

## 2014-09-16 VITALS — BP 129/60 | HR 102 | Temp 98.5°F | Resp 18

## 2014-09-16 DIAGNOSIS — C8590 Non-Hodgkin lymphoma, unspecified, unspecified site: Secondary | ICD-10-CM

## 2014-09-16 DIAGNOSIS — C800 Disseminated malignant neoplasm, unspecified: Secondary | ICD-10-CM | POA: Diagnosis not present

## 2014-09-16 DIAGNOSIS — C8330 Diffuse large B-cell lymphoma, unspecified site: Secondary | ICD-10-CM

## 2014-09-16 LAB — TYPE AND SCREEN
ABO/RH(D): O POS
Antibody Screen: NEGATIVE
Unit division: 0
Unit division: 0

## 2014-09-16 LAB — CBC WITH DIFFERENTIAL/PLATELET
HEMATOCRIT: 24.3 % — AB (ref 36.0–46.0)
HEMOGLOBIN: 8 g/dL — AB (ref 12.0–15.0)
MCH: 32.7 pg (ref 26.0–34.0)
MCHC: 32.9 g/dL (ref 30.0–36.0)
MCV: 99.2 fL (ref 78.0–100.0)
Platelets: 51 10*3/uL — ABNORMAL LOW (ref 150–400)
RBC: 2.45 MIL/uL — ABNORMAL LOW (ref 3.87–5.11)
RDW: 19 % — ABNORMAL HIGH (ref 11.5–15.5)
WBC: 0.1 10*3/uL — AB (ref 4.0–10.5)

## 2014-09-16 MED ORDER — DIPHENHYDRAMINE HCL 25 MG PO CAPS
25.0000 mg | ORAL_CAPSULE | Freq: Once | ORAL | Status: AC
  Administered 2014-09-16: 25 mg via ORAL

## 2014-09-16 MED ORDER — ACETAMINOPHEN 325 MG PO TABS
ORAL_TABLET | ORAL | Status: AC
  Filled 2014-09-16: qty 2

## 2014-09-16 MED ORDER — SODIUM CHLORIDE 0.9 % IV SOLN
250.0000 mL | Freq: Once | INTRAVENOUS | Status: AC
  Administered 2014-09-16: 250 mL via INTRAVENOUS

## 2014-09-16 MED ORDER — HEPARIN SOD (PORK) LOCK FLUSH 100 UNIT/ML IV SOLN
500.0000 [IU] | Freq: Once | INTRAVENOUS | Status: AC
  Administered 2014-09-16: 500 [IU] via INTRAVENOUS

## 2014-09-16 MED ORDER — HEPARIN SOD (PORK) LOCK FLUSH 100 UNIT/ML IV SOLN
250.0000 [IU] | INTRAVENOUS | Status: DC | PRN
  Filled 2014-09-16: qty 5

## 2014-09-16 MED ORDER — ACETAMINOPHEN 325 MG PO TABS
650.0000 mg | ORAL_TABLET | Freq: Once | ORAL | Status: AC
  Administered 2014-09-16: 650 mg via ORAL

## 2014-09-16 MED ORDER — DIPHENHYDRAMINE HCL 25 MG PO CAPS
ORAL_CAPSULE | ORAL | Status: AC
  Filled 2014-09-16: qty 1

## 2014-09-16 NOTE — Progress Notes (Signed)
CRITICAL VALUE ALERT  Critical value received:  WBC 0.1  Date of notification:  09/16/14  Time of notification:  6010  Critical value read back:Yes.    Nurse who received alert:  A. Ouida Sills, RN  MD notified:  Dr. Whitney Muse  Time:  9323

## 2014-09-16 NOTE — Progress Notes (Signed)
LABS FOR CBC, REDRAW OF TYSCR.  REMOVED ARMBAND

## 2014-09-16 NOTE — Patient Instructions (Signed)
[place] at [facility] Discharge Instructions  RECOMMENDATIONS MADE BY THE CONSULTANT AND ANY TEST RESULTS WILL BE SENT TO YOUR REFERRING PHYSICIAN.  Today you received one unit of blood.  Return tomorrow as scheduled to received the 2nd unit  Thank you for choosing [place] at [facility] to provide your oncology and hematology care.  To afford each patient quality time with our provider, please arrive at least 15 minutes before your scheduled appointment time.    You need to re-schedule your appointment should you arrive 10 or more minutes late.  We strive to give you quality time with our providers, and arriving late affects you and other patients whose appointments are after yours.  Also, if you no show three or more times for appointments you may be dismissed from the clinic at the providers discretion.     Again, thank you for choosing [facility].  Our hope is that these requests will decrease the amount of time that you wait before being seen by our physicians.       _____________________________________________________________  Should you have questions after your visit to [facility], please contact our office at [phone] between the hours of 8:30 a.m. and 4:30 p.m.  Voicemails left after 4:30 p.m. will not be returned until the following business day.  For prescription refill requests, have your pharmacy contact our office.

## 2014-09-17 ENCOUNTER — Encounter (HOSPITAL_BASED_OUTPATIENT_CLINIC_OR_DEPARTMENT_OTHER): Payer: Medicare Other

## 2014-09-17 ENCOUNTER — Encounter (HOSPITAL_COMMUNITY): Payer: Self-pay

## 2014-09-17 VITALS — BP 116/62 | HR 102 | Temp 98.8°F | Resp 18

## 2014-09-17 DIAGNOSIS — C8590 Non-Hodgkin lymphoma, unspecified, unspecified site: Secondary | ICD-10-CM

## 2014-09-17 DIAGNOSIS — T451X5A Adverse effect of antineoplastic and immunosuppressive drugs, initial encounter: Principal | ICD-10-CM

## 2014-09-17 DIAGNOSIS — C800 Disseminated malignant neoplasm, unspecified: Secondary | ICD-10-CM | POA: Diagnosis not present

## 2014-09-17 DIAGNOSIS — D6481 Anemia due to antineoplastic chemotherapy: Secondary | ICD-10-CM

## 2014-09-17 MED ORDER — ACETAMINOPHEN 325 MG PO TABS
650.0000 mg | ORAL_TABLET | Freq: Once | ORAL | Status: AC
  Administered 2014-09-17: 650 mg via ORAL

## 2014-09-17 MED ORDER — SODIUM CHLORIDE 0.9 % IV SOLN
250.0000 mL | Freq: Once | INTRAVENOUS | Status: AC
  Administered 2014-09-17: 250 mL via INTRAVENOUS

## 2014-09-17 MED ORDER — SODIUM CHLORIDE 0.9 % IJ SOLN
10.0000 mL | INTRAMUSCULAR | Status: AC | PRN
  Administered 2014-09-17: 10 mL

## 2014-09-17 MED ORDER — ACETAMINOPHEN 325 MG PO TABS
ORAL_TABLET | ORAL | Status: AC
  Filled 2014-09-17: qty 2

## 2014-09-17 MED ORDER — HEPARIN SOD (PORK) LOCK FLUSH 100 UNIT/ML IV SOLN
500.0000 [IU] | Freq: Every day | INTRAVENOUS | Status: AC | PRN
  Administered 2014-09-17: 500 [IU]

## 2014-09-17 MED ORDER — DIPHENHYDRAMINE HCL 25 MG PO CAPS
25.0000 mg | ORAL_CAPSULE | Freq: Once | ORAL | Status: AC
  Administered 2014-09-17: 25 mg via ORAL

## 2014-09-17 MED ORDER — DIPHENHYDRAMINE HCL 25 MG PO CAPS
ORAL_CAPSULE | ORAL | Status: AC
  Filled 2014-09-17: qty 1

## 2014-09-17 NOTE — Patient Instructions (Addendum)
[place] at [facility] Discharge Instructions  RECOMMENDATIONS MADE BY THE CONSULTANT AND ANY TEST RESULTS WILL BE SENT TO YOUR REFERRING PHYSICIAN.  Today you received one unit of blood. Return as scheduled for office visit. PLEASE CONTACT YOUR DOCTOR AT [facility] REGARDING YOUR SORE THROAT AND EARACHE.   Thank you for choosing [place] at [facility] to provide your oncology and hematology care.  To afford each patient quality time with our provider, please arrive at least 15 minutes before your scheduled appointment time.    You need to re-schedule your appointment should you arrive 10 or more minutes late.  We strive to give you quality time with our providers, and arriving late affects you and other patients whose appointments are after yours.  Also, if you no show three or more times for appointments you may be dismissed from the clinic at the providers discretion.     Again, thank you for choosing [facility].  Our hope is that these requests will decrease the amount of time that you wait before being seen by our physicians.       _____________________________________________________________  Should you have questions after your visit to [facility], please contact our office at [phone] between the hours of 8:30 a.m. and 4:30 p.m.  Voicemails left after 4:30 p.m. will not be returned until the following business day.  For prescription refill requests, have your pharmacy contact our office.

## 2014-09-17 NOTE — Progress Notes (Signed)
1220:  Discharged via wheelchair in c/o son-in-law for transport home.  A&Ox4; in no distress; VSS.

## 2014-09-18 LAB — TYPE AND SCREEN
ABO/RH(D): O POS
ANTIBODY SCREEN: NEGATIVE
UNIT DIVISION: 0
Unit division: 0

## 2014-10-04 ENCOUNTER — Encounter (HOSPITAL_BASED_OUTPATIENT_CLINIC_OR_DEPARTMENT_OTHER): Payer: Medicare Other

## 2014-10-04 ENCOUNTER — Encounter (HOSPITAL_BASED_OUTPATIENT_CLINIC_OR_DEPARTMENT_OTHER): Payer: Medicare Other | Admitting: Hematology & Oncology

## 2014-10-04 ENCOUNTER — Encounter (HOSPITAL_COMMUNITY): Payer: Medicare Other

## 2014-10-04 ENCOUNTER — Encounter (HOSPITAL_COMMUNITY): Payer: Self-pay | Admitting: Hematology & Oncology

## 2014-10-04 ENCOUNTER — Ambulatory Visit (HOSPITAL_COMMUNITY): Payer: Self-pay

## 2014-10-04 DIAGNOSIS — C800 Disseminated malignant neoplasm, unspecified: Secondary | ICD-10-CM | POA: Diagnosis not present

## 2014-10-04 DIAGNOSIS — C8590 Non-Hodgkin lymphoma, unspecified, unspecified site: Secondary | ICD-10-CM

## 2014-10-04 DIAGNOSIS — Z5189 Encounter for other specified aftercare: Secondary | ICD-10-CM | POA: Diagnosis not present

## 2014-10-04 DIAGNOSIS — C8333 Diffuse large B-cell lymphoma, intra-abdominal lymph nodes: Secondary | ICD-10-CM | POA: Diagnosis present

## 2014-10-04 LAB — COMPREHENSIVE METABOLIC PANEL
ALBUMIN: 2.9 g/dL — AB (ref 3.5–5.2)
ALT: 14 U/L (ref 0–35)
AST: 16 U/L (ref 0–37)
Alkaline Phosphatase: 53 U/L (ref 39–117)
Anion gap: 9 (ref 5–15)
BILIRUBIN TOTAL: 0.5 mg/dL (ref 0.3–1.2)
BUN: 19 mg/dL (ref 6–23)
CO2: 25 mmol/L (ref 19–32)
CREATININE: 0.67 mg/dL (ref 0.50–1.10)
Calcium: 8.6 mg/dL (ref 8.4–10.5)
Chloride: 104 mmol/L (ref 96–112)
GFR calc Af Amer: >90 mL/min (ref >90)
GFR calc non Af Amer: 88 mL/min — ABNORMAL LOW (ref >90)
Glucose, Bld: 96 mg/dL (ref 70–99)
Potassium: 3.7 mmol/L (ref 3.5–5.1)
Sodium: 138 mmol/L (ref 135–145)
Total Protein: 5.1 g/dL — ABNORMAL LOW (ref 6.0–8.3)

## 2014-10-04 LAB — CBC WITH DIFFERENTIAL/PLATELET
BASOS ABS: 0 10*3/uL (ref 0.0–0.1)
BASOS PCT: 0 % (ref 0–1)
EOS ABS: 0.1 10*3/uL (ref 0.0–0.7)
Eosinophils Relative: 1 % (ref 0–5)
HEMATOCRIT: 32 % — AB (ref 36.0–46.0)
HEMOGLOBIN: 10.5 g/dL — AB (ref 12.0–15.0)
LYMPHS ABS: 0.2 10*3/uL — AB (ref 0.7–4.0)
Lymphocytes Relative: 2 % — ABNORMAL LOW (ref 12–46)
MCH: 31.7 pg (ref 26.0–34.0)
MCHC: 32.8 g/dL (ref 30.0–36.0)
MCV: 96.7 fL (ref 78.0–100.0)
MONO ABS: 0 10*3/uL — AB (ref 0.1–1.0)
Monocytes Relative: 0 % — ABNORMAL LOW (ref 3–12)
Neutro Abs: 8.6 10*3/uL — ABNORMAL HIGH (ref 1.7–7.7)
Neutrophils Relative %: 97 % — ABNORMAL HIGH (ref 43–77)
Platelets: 184 10*3/uL (ref 150–400)
RBC: 3.31 MIL/uL — AB (ref 3.87–5.11)
RDW: 20.2 % — ABNORMAL HIGH (ref 11.5–15.5)
WBC: 8.8 10*3/uL (ref 4.0–10.5)

## 2014-10-04 LAB — MAGNESIUM: Magnesium: 1.9 mg/dL (ref 1.5–2.5)

## 2014-10-04 MED ORDER — PEGFILGRASTIM INJECTION 6 MG/0.6ML ~~LOC~~
PREFILLED_SYRINGE | SUBCUTANEOUS | Status: AC
  Filled 2014-10-04: qty 0.6

## 2014-10-04 MED ORDER — PEGFILGRASTIM INJECTION 6 MG/0.6ML ~~LOC~~
6.0000 mg | PREFILLED_SYRINGE | Freq: Once | SUBCUTANEOUS | Status: AC
  Administered 2014-10-04: 6 mg via SUBCUTANEOUS

## 2014-10-04 NOTE — Progress Notes (Signed)
Erica Woodward presents today for injection per MD orders. Neulasta 6mg  administered SQ in left Abdomen. Administration without incident. Patient tolerated well.

## 2014-10-04 NOTE — Progress Notes (Signed)
Please see office visit for more information

## 2014-10-04 NOTE — Patient Instructions (Signed)
[place] at [facility] Discharge Instructions  RECOMMENDATIONS MADE BY THE CONSULTANT AND ANY TEST RESULTS WILL BE SENT TO YOUR REFERRING PHYSICIAN.  Exam and discussion by Dr. Whitney Muse. Your blood counts are good today. Neulasta to be given today. Take your pain med as needed for bone pain/  Follow-up: Labs as scheduled  Office visit in 2 months.  Thank you for choosing [place] at [facility] to provide your oncology and hematology care.  To afford each patient quality time with our provider, please arrive at least 15 minutes before your scheduled appointment time.    You need to re-schedule your appointment should you arrive 10 or more minutes late.  We strive to give you quality time with our providers, and arriving late affects you and other patients whose appointments are after yours.  Also, if you no show three or more times for appointments you may be dismissed from the clinic at the providers discretion.     Again, thank you for choosing [facility].  Our hope is that these requests will decrease the amount of time that you wait before being seen by our physicians.       _____________________________________________________________  Should you have questions after your visit to [facility], please contact our office at [phone] between the hours of 8:30 a.m. and 4:30 p.m.  Voicemails left after 4:30 p.m. will not be returned until the following business day.  For prescription refill requests, have your pharmacy contact our office.

## 2014-10-04 NOTE — Progress Notes (Signed)
ESRD s/p kidney transplant   Peritoneal carcinomatosis seen on imaging performed due to abdominal pain 06/16/2014 at [facility]  Paracentesis by IR which returned high grade malignant neoplasm, favor lymphoma. exploratory lap and biopsies on 06/25/14 and path favored Burkitt's lymphoma   FISH from her omental biopsy returned negative for MYC, BCL2, BCL6 ruling out Burkitt's. 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   Bone marrow biopsy on 12/18 which revealed Normocellular marrow with trilineage hematopoiesis and maturation. Mild CD20 positive interstitial B-cell lymphocytosis. HIV and EBV were negative.   CT Chest on 12/17 showed a small poorly defined collection of soft tissue/fluid in the caudal middle mediastinum adjacent to the distal esophagus.  ECHO 12/18 prior to chemo showed EF 60-65%.   Cycle 1 of R-EPOCH with ppx IT MTX given the aggressive nature of her disease.   CURRENT THERAPY: R-EPOCH and IT MTX at [facility] patient changed to R-CHOP       IT-MTX given for 4 cycles  INTERVAL HISTORY: Erica Woodward 69 y.o. female returns for DLBCL. She is very tired today. Her family notes that after her last cycle of chemotherapy she seemed to have a lot more energy. She only has one additional treatment left however which will be given on April 4. Her last cycle of chemotherapy was changed to R CHOP. Her family thinks this is because of her problems with hearing loss. She has an appointment with audiology although they are not sure when. She states other than being fatigued she has no nausea, vomiting, or pain.  MEDICAL HISTORY: Past Medical History  Diagnosis Date  . Renal disorder     kidney transplant 2005  . Hypertension   . Cancer     carcinomatosis  . GERD (gastroesophageal reflux disease)     has HLD (hyperlipidemia); GOUT; ACUTE SEROUS OTITIS MEDIA; OTITIS MEDIA, ACUTE WITH RUPTURE OF TYMPANIC MEMBRANE; Essential hypertension; KNEE INJURY, RIGHT;  KIDNEY TRANSPLANTATION, HX OF; Abdominal pain; Carcinomatosis; GERD (gastroesophageal reflux disease); and NHL (non-Hodgkin's lymphoma) on her problem list.     No history exists.     has No Known Allergies.  We administered pegfilgrastim.  SURGICAL HISTORY: Past Surgical History  Procedure Laterality Date  . Kidney transplant    . Portacath placement    . Lumbar puncture w/ intrathecal chemotherapy  08/19/14   Kidney Transplant in 2005 at [place] donor transplant  SOCIAL HISTORY: History   Social History  . Marital Status: Married    Spouse Name: N/A  . Number of Children: N/A  . Years of Education: N/A   Occupational History  . Not on file.   Social History Main Topics  . Smoking status: Never Smoker   . Smokeless tobacco: Never Used  . Alcohol Use: No  . Drug Use: Not on file  . Sexual Activity: Not on file   Other Topics Concern  . Not on file   Social History Narrative   She is living with one of her daughters. 4 children. 5 grandchildren.  Divorced. Smoked only briefly in the past. Worked in a factory.  FAMILY HISTORY: Family History  Problem Relation Age of Onset  . Kidney disease Father   . Lung cancer Brother   . Seizures Brother   . Seizures Sister    Mother deceased late 05'L from complications of pneumonia Father died in an MVA at a young age 75 had lung cancer, died at age 8   Review of  Systems  Constitutional: Positive for malaise/fatigue. Negative for fever, chills and weight loss.  HENT: Negative for congestion, hearing loss, nosebleeds, sore throat and tinnitus.   Eyes: Negative for blurred vision, double vision, pain and discharge.  Respiratory: Negative for cough, hemoptysis, sputum production, shortness of breath and wheezing.   Cardiovascular: Negative for chest pain, palpitations, claudication, leg swelling and PND.  Gastrointestinal: Negative for heartburn, nausea, vomiting, abdominal pain, diarrhea, constipation, blood  in stool and melena.  Genitourinary: Negative for dysuria, urgency, frequency and hematuria.  Musculoskeletal: Negative for myalgias, joint pain and falls.  Skin: Negative for itching and rash.  Neurological: Positive for weakness. Negative for dizziness, tingling, tremors, sensory change, speech change, focal weakness, seizures, loss of consciousness and headaches.  Endo/Heme/Allergies: Does not bruise/bleed easily.  Psychiatric/Behavioral: Negative for depression, suicidal ideas, memory loss and substance abuse. The patient is not nervous/anxious and does not have insomnia.     PHYSICAL EXAMINATION  ECOG PERFORMANCE STATUS: 1 - Symptomatic but completely ambulatory  Filed Vitals:   10/04/14 1000  BP: 100/72  Pulse: 128  Temp: 99.2 F (37.3 C)  Resp: 22    Physical Exam  Constitutional: She is oriented to person, place, and time. No distress.  Alopecia  HENT:  Head: Normocephalic and atraumatic.  Nose: Nose normal.  Mouth/Throat: Oropharynx is clear and moist. No oropharyngeal exudate.  Eyes: Conjunctivae and EOM are normal. Pupils are equal, round, and reactive to light. Right eye exhibits no discharge. Left eye exhibits no discharge. No scleral icterus.  Neck: Normal range of motion. Neck supple. No tracheal deviation present. No thyromegaly present.  Cardiovascular: Normal rate, regular rhythm and normal heart sounds.  Exam reveals no gallop and no friction rub.   No murmur heard. Pulmonary/Chest: Effort normal and breath sounds normal. She has no wheezes. She has no rales.  Abdominal: Soft. Bowel sounds are normal. She exhibits no distension and no mass. There is no tenderness. There is no rebound and no guarding.  Musculoskeletal: Normal range of motion. She exhibits no edema.  Lymphadenopathy:    She has no cervical adenopathy.  Neurological: She is alert and oriented to person, place, and time. She has normal reflexes. No cranial nerve deficit. Gait normal. Coordination  normal.  Gait is WNL but slow  Skin: Skin is warm and dry. No rash noted. She is not diaphoretic.  Psychiatric: Mood, memory, affect and judgment normal.  Nursing note and vitals reviewed.   LABORATORY DATA:  CBC    Component Value Date/Time   WBC 8.8 10/04/2014 1104   RBC 3.31* 10/04/2014 1104   HGB 10.5* 10/04/2014 1104   HCT 32.0* 10/04/2014 1104   PLT 184 10/04/2014 1104   MCV 96.7 10/04/2014 1104   MCH 31.7 10/04/2014 1104   MCHC 32.8 10/04/2014 1104   RDW 20.2* 10/04/2014 1104   LYMPHSABS 0.2* 10/04/2014 1104   MONOABS 0.0* 10/04/2014 1104   EOSABS 0.1 10/04/2014 1104   BASOSABS 0.0 10/04/2014 1104   CMP     Component Value Date/Time   NA 138 10/04/2014 1104   K 3.7 10/04/2014 1104   CL 104 10/04/2014 1104   CO2 25 10/04/2014 1104   GLUCOSE 96 10/04/2014 1104   BUN 19 10/04/2014 1104   CREATININE 0.67 10/04/2014 1104   CALCIUM 8.6 10/04/2014 1104   PROT 5.1* 10/04/2014 1104   ALBUMIN 2.9* 10/04/2014 1104   AST 16 10/04/2014 1104   ALT 14 10/04/2014 1104   ALKPHOS 53 10/04/2014 1104   BILITOT  0.5 10/04/2014 1104   GFRNONAA 88* 10/04/2014 1104   GFRAA >90 10/04/2014 1104     ASSESSMENT and THERAPY PLAN:   Diffuse large B-cell lymphoma, 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 She has done surprisingly well. Her chemotherapy has been changed to R-CHOP and her family thinks this is secondary to her hearing loss. She has had a lot more fatigue with this treatment but is overall doing well. Her last cycle is scheduled for April 4. We will continue with laboratory studies in accordance with [facility] guidelines. She will receive Neulasta today. They were again advised to call if there is any way we can help in the interim. We will see her back in 2 months.  All questions were answered. The patient knows to call the clinic with any problems, questions or concerns. We can certainly see the patient much sooner if  necessary.  Molli Hazard 10/04/2014

## 2014-10-04 NOTE — Progress Notes (Signed)
Labs drawn

## 2014-10-07 ENCOUNTER — Other Ambulatory Visit (HOSPITAL_COMMUNITY): Payer: Self-pay

## 2014-10-11 ENCOUNTER — Encounter (HOSPITAL_BASED_OUTPATIENT_CLINIC_OR_DEPARTMENT_OTHER): Payer: Medicare Other

## 2014-10-11 DIAGNOSIS — C800 Disseminated malignant neoplasm, unspecified: Secondary | ICD-10-CM | POA: Diagnosis not present

## 2014-10-11 DIAGNOSIS — C8330 Diffuse large B-cell lymphoma, unspecified site: Secondary | ICD-10-CM

## 2014-10-11 DIAGNOSIS — C8590 Non-Hodgkin lymphoma, unspecified, unspecified site: Secondary | ICD-10-CM

## 2014-10-11 LAB — CBC WITH DIFFERENTIAL/PLATELET
Basophils Absolute: 0 10*3/uL (ref 0.0–0.1)
Basophils Relative: 1 % (ref 0–1)
Eosinophils Absolute: 0 10*3/uL (ref 0.0–0.7)
Eosinophils Relative: 1 % (ref 0–5)
HCT: 24.3 % — ABNORMAL LOW (ref 36.0–46.0)
HEMOGLOBIN: 8.3 g/dL — AB (ref 12.0–15.0)
LYMPHS ABS: 0.2 10*3/uL — AB (ref 0.7–4.0)
LYMPHS PCT: 9 % — AB (ref 12–46)
MCH: 32.7 pg (ref 26.0–34.0)
MCHC: 34.2 g/dL (ref 30.0–36.0)
MCV: 95.7 fL (ref 78.0–100.0)
MONOS PCT: 23 % — AB (ref 3–12)
Monocytes Absolute: 0.4 10*3/uL (ref 0.1–1.0)
NEUTROS ABS: 1.2 10*3/uL — AB (ref 1.7–7.7)
Neutrophils Relative %: 67 % (ref 43–77)
Platelets: 31 10*3/uL — ABNORMAL LOW (ref 150–400)
RBC: 2.54 MIL/uL — AB (ref 3.87–5.11)
RDW: 18.9 % — ABNORMAL HIGH (ref 11.5–15.5)
WBC: 1.7 10*3/uL — ABNORMAL LOW (ref 4.0–10.5)

## 2014-10-11 LAB — COMPREHENSIVE METABOLIC PANEL
ALBUMIN: 3.1 g/dL — AB (ref 3.5–5.2)
ALT: 12 U/L (ref 0–35)
AST: 17 U/L (ref 0–37)
Alkaline Phosphatase: 52 U/L (ref 39–117)
Anion gap: 8 (ref 5–15)
BILIRUBIN TOTAL: 0.4 mg/dL (ref 0.3–1.2)
BUN: 14 mg/dL (ref 6–23)
CALCIUM: 8.7 mg/dL (ref 8.4–10.5)
CO2: 23 mmol/L (ref 19–32)
Chloride: 107 mmol/L (ref 96–112)
Creatinine, Ser: 0.6 mg/dL (ref 0.50–1.10)
GFR calc Af Amer: >90 mL/min (ref >90)
Glucose, Bld: 117 mg/dL — ABNORMAL HIGH (ref 70–99)
Potassium: 3.2 mmol/L — ABNORMAL LOW (ref 3.5–5.1)
Sodium: 138 mmol/L (ref 135–145)
Total Protein: 5.4 g/dL — ABNORMAL LOW (ref 6.0–8.3)

## 2014-10-11 LAB — MAGNESIUM: Magnesium: 1.5 mg/dL (ref 1.5–2.5)

## 2014-10-11 NOTE — Progress Notes (Signed)
Labs drawn

## 2014-10-14 ENCOUNTER — Other Ambulatory Visit (HOSPITAL_COMMUNITY): Payer: Self-pay

## 2014-11-06 NOTE — Discharge Summary (Signed)
PATIENT NAME:  Erica Woodward, Erica Woodward MR#:  [id number] DATE OF BIRTH:  Dec 06, 1945  DATE OF ADMISSION:  06/28/2014 DATE OF DISCHARGE:  07/01/2014  ADDENDUM  PRIMARY CARE PHYSICIAN: Nonlocal.  See discharge summary from yesterday. This is an addendum. The patient still doing okay.  No complaints of abdominal pain, only when palpating. The patient wants to try solid food, so I will try to advance the diet. The patient is able to take oral medications, so Solu-Medrol was switched over to prednisone 10 mg, her usual dose. The patient remains clinically stable. Follow up at [facility] for consideration for treatment for high-grade lymphoma in the abdomen.  ____________________________ Tana Conch. Leslye Peer, MD rjw:sb D: 07/01/2014 08:06:29 ET T: 07/01/2014 08:29:31 ET JOB#: [id number]  cc: Tana Conch. Leslye Peer, MD, <Dictator> [facility] - Dr. Hessie Dibble Marisue [place] MD ELECTRONICALLY SIGNED 07/07/2014 15:50

## 2014-11-06 NOTE — H&P (Signed)
PATIENT NAME:  Erica Woodward, Erica Woodward MR#:  [id number] DATE OF BIRTH:  1945-09-20  DATE OF ADMISSION:  06/28/2014  PRIMARY CARE PHYSICIAN:  Primary Care.    REFERRING PHYSICIAN: Ahmed Prima, MD  CHIEF COMPLAINT: Nausea, vomiting, abdominal pain.   HISTORY OF PRESENT ILLNESS: Erica Woodward is a 69 year old female with a history of end-stage renal disease, status post kidney transplant in 2005, has been on chronic immunosuppressant, has been having decreased appetite, loss of weight since spring of 2015. About 3 weeks' back, the patient started to have abdominal pain, nausea, vomiting. Concerning this, went to [facility] where a CT scan showed peritoneal carcinomatosis. Concerning this, the patient was sent to [facility] for further evaluation. The patient had a paracentesis done at [facility], which showed high-grade malignant neoplasm favoring lymphoma. However, as well as this was inconclusive, the patient underwent open diagnostic laparoscopy with biopsy of the omentum and had placement a peritoneal drain. During the hospital stay, the patient was seen by nephrology for the transplanted kidney. Recommended to discontinue the mycophenolate mofetil and reducing the Prograf dose, added the prednisone. As the biopsy report was pending, the patient was discharged home with followup up with the primary care physician, and the patient was told that she will called about the report of the malignancy. The patient was sent home with home health. Home healthcare staff saw the patient. The patient continues to have nausea, vomiting, abdominal pain, unable to tolerate any diet. Concerning this, home healthcare nurse recommended the patient to go to the Emergency Department. Workup in the Emergency Department: The patient was noted to have a small bowel obstruction and CT of the abdomen and pelvis showed worsening of the small bowel obstruction with transition point in the pelvic area with  tumor implant from peritoneal carcinomatosis encasing the loop of the distal small bowel, which is causing the bowel obstruction. The patient was seen by surgery. Concerning about the patient's multiple medical problems felt that the patient is currently not a candidate for any surgery. Recommended the patient to be admitted to the medicine department. The patient did not have any fever, has been experiencing severe lethargy.   PAST MEDICAL HISTORY:  1.  Hypertension.  2.  Kidney transplant in 2005.  3.  Recent diagnosis of malignancy.   ALLERGIES: No known drug allergies.   HOME MEDICATIONS:  1.   25 mg once a day.  2.  Phenergan 12.5 mg every 6 hours as needed.  3.  Prograf 0.5 mg 1-1/2 tablets every 12 hours.  4.  Neutra-Phos 1 tablet 2 times a day.  5.  Oxycodone 5/325 mg 1-2 tablets every 4 hours as needed.  6.  Nexium 40 mg once a day.  7.  Mycophenolate 2 tablets 2 times a day.  8.  Magnesium oxide 400 mg 2 tablets 2 times a day.  9.  Lipitor 20 mg once a day.  10.  Lexapro 20 mg once a day.  11.  Colace 100 mg 2 times a day.  SOCIAL HISTORY: No history of smoking, drinking alcohol, or using illicit drugs. Lives by herself.   FAMILY HISTORY: Mother died of lung cancer. Father had kidney disease.   REVIEW OF SYSTEMS:   CONSTITUTIONAL: Experiencing severe generalized weakness.  EYES: No change in vision.  ENT: No change in hearing.  RESPIRATORY: No cough, shortness of breath.  CARDIOVASCULAR: No chest pain, palpitations.  GASTROINTESTINAL: Has nausea, vomiting, abdominal pain.  GENITOURINARY: No dysuria or  hematuria.  HEMATOLOGIC: No easy bruising or bleeding.  SKIN: No rash or lesions.  MUSCULOSKELETAL: No joint pains or aches.  NEUROLOGIC: No weakness or numbness in any part of the body.   PHYSICAL EXAMINATION:  GENERAL: This is a chronically ill-looking female lying down in the bed, looks lethargic.  VITAL SIGNS: Temperature 97.5, pulse 103, blood pressure 126/76,  respiratory rate of 18, oxygen saturation 95% on room air.  HEENT: Head normocephalic, atraumatic. There is no scleral icterus. Conjunctivae are normal. Pupils equal and react to light. Mucous membranes: Mild dryness. No pharyngeal erythema.  NECK: Supple. No lymphadenopathy. No JVD. No carotid bruit. No thyromegaly.  CHEST: Has no focal tenderness. Decreased breath sounds in the lower lobes.  HEART: S1, S2 regular. No murmurs are heard.  ABDOMEN: Bowel sounds are present. Soft. Has tenderness in the lower part of the abdomen. No rebound or guarding. Could not appreciate any hepatosplenomegaly.  EXTREMITIES: No pedal edema. Pulses 2+.  NEUROLOGIC: The patient is alert, oriented to place, person, and time. Cranial nerves II through XII intact. Motor 5/5 in upper and lower extremities.   LABORATORY DATA: UA negative for nitrites and leukocyte esterase. CT abdomen and pelvis: Interval worsening of the small bowel obstruction with a transition point identified in the pelvis where a tumor implant from peritoneal carcinomatosis encases a loop of distal small bowel. Primary location is not identified. New small bilateral pleural effusions and small fluid collection essentially in the pelvis is unchanged.   ASSESSMENT AND PLAN:  1.  Partial small bowel obstruction from the tumor encasing the distal small bowel. Surgery has been consulted. A nasogastric tube has been placed. The patient is not draining a large amount of fluid. The patient states she had 1 bowel movement yesterday morning. Continue with intravenous fluids. Keep the patient nothing by mouth except for medications. The patient is currently not vomiting and does not how much drainage. We will clamp the nasogastric tube and follow up. If the patient continues to have severe nausea and vomiting, at that time consider to low wall suction.   2.  Malignant tumor of the abdomen. The primary is unknown. Biopsy report is pending from [facility]. Consider contacting during he daytime for possible report and getting an oncology consult.  3.  Renal transplant: Recommended to continue the Prograf and add prednisone. Recommended to continue the Prograf 1/2 tablet 0.5 mg 2 times a day. I do not see the prednisone on the discharge medication list; however, the patient was given a prescription for prednisone. For now, we will continue with prednisone of 60 mg daily.  4.  Hyperlipidemia: We will hold the atorvastatin for now.  5.  Status post laparoscopy with incision. The patient has a Jackson-Pratt drain in place. We will need to remove once the patient does not have any significant drainage.  6.  Severe debility: We will involve physical therapy. The patient seems to have poor functional status.  7.  Keep the patient on deep vein thrombosis prophylaxis with Lovenox.   TIME SPENT: 60 minutes.    ____________________________ Monica Becton, MD pv:ts D: 06/29/2014 02:42:29 ET T: 06/29/2014 03:50:50 ET JOB#: [id number]  cc: Monica Becton, MD, <Dictator> Monica Becton MD ELECTRONICALLY SIGNED 07/12/2014 22:10

## 2014-11-06 NOTE — Discharge Summary (Signed)
PATIENT NAME:  Erica Woodward, SINGLEY MR#:  [id number] DATE OF BIRTH:  07-09-1946  DATE OF ADMISSION:  06/28/2014 DATE OF DISCHARGE:  06/30/2014   PRIMARY CARE PHYSICIAN:  Not listed here.  FINAL DIAGNOSES: 1. Small bowel obstruction,  2. 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  3. History of kidney transplant.  4. Gastroesophageal reflux disease.  5. Hyperlipidemia.  6. Depression.   HOSPITAL COURSE: The patient was admitted 06/28/2014, potential transfer to [facility] 06/30/2014, pending bed availability. The patient came in with nausea, vomiting, abdominal pain, admitted with small bowel obstruction, tumor encasing the distal small bowel. Patient had an NG tube placed. The patient recently had a surgical procedure over at [facility], and biopsy report came back while the patient was in the hospital here.   LABORATORY AND RADIOLOGICAL DATA DURING THE HOSPITAL COURSE: Included glucose 81, BUN 15, creatinine 1.12, sodium 143, potassium 4.5, chloride 105, CO2 of 24, calcium 8.0. Liver function tests: Albumin low at 2.3, total protein low at 4.8. Other liver function tests normal range. Lipase 30. White blood cell count 5.8, H and H 12.1 and 38.4, platelet count of 464. Urinalysis 1+ bilirubin, trace ketones.   Abdomen flat and erect showed findings consistent with small bowel obstruction. CT scan of the abdomen and pelvis with contrast showed worsening small bowel obstruction with transition point identified in the pelvis with a tumor from peritoneal carcinoma encased in a loop of distal small bowel. Primary care is not identified.  On the 15th, the patient had another abdominal x-ray that showed minimal improvement in a small bowel obstruction, partial, no evidence of free intraperitoneal air or acute complication.   White blood cell count 5.4, H and H 11.6 and 35.8, platelet count of 414, glucose 92, BUN 17, creatinine 1.03, sodium 141, potassium 4.3, chloride 107, CO2 of 23.    CURRENT MEDICATIONS: Include dextrose 5% at 20 mL/h, Solu-Medrol 50 mg IV push daily, Prograf 1 capsule b.i.d., Zofran 4 mg IV q.4h. p.r.n. nausea, vomiting, morphine 4 mg IV q.4h. p.r.n. severe pain, Lovenox 40 mg subcutaneous injection daily, Lexapro 20 mg daily, Topamax 25 mg daily and Lipitor 20 mg at bedtime, Protonix 40 mg daily.  HOSPITAL COURSE PER PROBLEM LIST:  1.  Small bowel obstruction: The patient is very stoic, does not complain of anything, likely secondary to the lymphoma. I spoke with Dr. Hessie Dibble at [facility]; would like to start treatment then bring the patient over there. This could be a Burkitt lymphoma.  Currently they do not have any beds but will open up and take the patient over when a bed is available. The patient is currently on liquid diet and IV fluid hydration.  2.  History of kidney transplant:  Mycophenolate was stopped and her transplant nephrologist recommended Prograf. The patient is also on IV Solu-Medrol, right now.  3.  Gastroesophageal reflux disease: On PPI.  4.  Hyperlipidemia:  On Lipitor.  5.  Depression:  On Lexapro    TIME SPENT ON DISCHARGE: 35 minutes.    ____________________________ Tana Conch. Leslye Peer, MD rjw:jp D: 06/30/2014 09:08:35 ET T: 06/30/2014 09:44:47 ET JOB#: [id number]  cc: Tana Conch. Leslye Peer, MD, <Dictator> Dr. Hessie Dibble at [facility] SIGNED 07/07/2014 15:50

## 2014-11-08 ENCOUNTER — Encounter (HOSPITAL_COMMUNITY): Payer: Medicare Other | Attending: Hematology & Oncology

## 2014-11-08 VITALS — BP 111/72 | HR 119 | Temp 98.2°F | Resp 24

## 2014-11-08 DIAGNOSIS — C800 Disseminated malignant neoplasm, unspecified: Secondary | ICD-10-CM | POA: Insufficient documentation

## 2014-11-08 DIAGNOSIS — Z5189 Encounter for other specified aftercare: Secondary | ICD-10-CM | POA: Diagnosis not present

## 2014-11-08 DIAGNOSIS — C8330 Diffuse large B-cell lymphoma, unspecified site: Secondary | ICD-10-CM

## 2014-11-08 DIAGNOSIS — C8590 Non-Hodgkin lymphoma, unspecified, unspecified site: Secondary | ICD-10-CM

## 2014-11-08 LAB — COMPREHENSIVE METABOLIC PANEL
ALBUMIN: 3.3 g/dL — AB (ref 3.5–5.2)
ALT: 18 U/L (ref 0–35)
AST: 25 U/L (ref 0–37)
Alkaline Phosphatase: 59 U/L (ref 39–117)
Anion gap: 7 (ref 5–15)
BILIRUBIN TOTAL: 0.3 mg/dL (ref 0.3–1.2)
BUN: 19 mg/dL (ref 6–23)
CHLORIDE: 104 mmol/L (ref 96–112)
CO2: 22 mmol/L (ref 19–32)
CREATININE: 0.74 mg/dL (ref 0.50–1.10)
Calcium: 8.7 mg/dL (ref 8.4–10.5)
GFR calc Af Amer: >90 mL/min (ref >90)
GFR calc non Af Amer: 85 mL/min — ABNORMAL LOW (ref >90)
Glucose, Bld: 137 mg/dL — ABNORMAL HIGH (ref 70–99)
POTASSIUM: 4.1 mmol/L (ref 3.5–5.1)
SODIUM: 133 mmol/L — AB (ref 135–145)
TOTAL PROTEIN: 5.7 g/dL — AB (ref 6.0–8.3)

## 2014-11-08 LAB — CBC WITH DIFFERENTIAL/PLATELET
BASOS ABS: 0 10*3/uL (ref 0.0–0.1)
Basophils Relative: 0 % (ref 0–1)
Eosinophils Absolute: 0 10*3/uL (ref 0.0–0.7)
Eosinophils Relative: 0 % (ref 0–5)
HCT: 33.4 % — ABNORMAL LOW (ref 36.0–46.0)
HEMOGLOBIN: 11.1 g/dL — AB (ref 12.0–15.0)
LYMPHS PCT: 6 % — AB (ref 12–46)
Lymphs Abs: 0.3 10*3/uL — ABNORMAL LOW (ref 0.7–4.0)
MCH: 35.5 pg — AB (ref 26.0–34.0)
MCHC: 33.2 g/dL (ref 30.0–36.0)
MCV: 106.7 fL — ABNORMAL HIGH (ref 78.0–100.0)
MONO ABS: 0 10*3/uL — AB (ref 0.1–1.0)
Monocytes Relative: 0 % — ABNORMAL LOW (ref 3–12)
NEUTROS ABS: 4.8 10*3/uL (ref 1.7–7.7)
NEUTROS PCT: 94 % — AB (ref 43–77)
Platelets: 85 10*3/uL — ABNORMAL LOW (ref 150–400)
RBC: 3.13 MIL/uL — ABNORMAL LOW (ref 3.87–5.11)
RDW: 18.8 % — AB (ref 11.5–15.5)
WBC: 5.1 10*3/uL (ref 4.0–10.5)

## 2014-11-08 LAB — MAGNESIUM: Magnesium: 1.8 mg/dL (ref 1.5–2.5)

## 2014-11-08 MED ORDER — PEGFILGRASTIM INJECTION 6 MG/0.6ML ~~LOC~~
6.0000 mg | PREFILLED_SYRINGE | Freq: Once | SUBCUTANEOUS | Status: AC
  Administered 2014-11-08: 6 mg via SUBCUTANEOUS

## 2014-11-08 MED ORDER — PEGFILGRASTIM INJECTION 6 MG/0.6ML: 6.0000 mg | Freq: Once | SUBCUTANEOUS | Status: DC

## 2014-11-08 MED ORDER — PEGFILGRASTIM INJECTION 6 MG/0.6ML ~~LOC~~
PREFILLED_SYRINGE | SUBCUTANEOUS | Status: AC
  Filled 2014-11-08: qty 0.6

## 2014-11-08 NOTE — Progress Notes (Signed)
Erica Woodward presented for labwork. Labs per MD order drawn via Peripheral Line 23 gauge needle inserted in right antecubital.  Good blood return present. Procedure without incident.  Needle removed intact. Patient tolerated procedure well.  Erica Woodward presents today for injection per MD orders. Neulasta 6mg  administered SQ in right Abdomen. Administration without incident. Patient tolerated well.

## 2014-11-08 NOTE — Patient Instructions (Signed)
[place] at [facility] Discharge Instructions  RECOMMENDATIONS MADE BY THE CONSULTANT AND ANY TEST RESULTS WILL BE SENT TO YOUR REFERRING PHYSICIAN.  Lab work done here today since you have not seen or heard from [facility] as of yet. We will FAX lab results when available to [facility]/Dr.Hurd. Neulasta injection given as ordered. Return as scheduled.  Thank you for choosing [place] at [facility] to provide your oncology and hematology care.  To afford each patient quality time with our provider, please arrive at least 15 minutes before your scheduled appointment time.    You need to re-schedule your appointment should you arrive 10 or more minutes late.  We strive to give you quality time with our providers, and arriving late affects you and other patients whose appointments are after yours.  Also, if you no show three or more times for appointments you may be dismissed from the clinic at the providers discretion.     Again, thank you for choosing [facility].  Our hope is that these requests will decrease the amount of time that you wait before being seen by our physicians.       _____________________________________________________________  Should you have questions after your visit to [facility], please contact our office at [phone] between the hours of 8:30 a.m. and 4:30 p.m.  Voicemails left after 4:30 p.m. will not be returned until the following business day.  For prescription refill requests, have your pharmacy contact our office.

## 2014-11-20 ENCOUNTER — Emergency Department (HOSPITAL_COMMUNITY)
Admission: EM | Admit: 2014-11-20 | Discharge: 2014-11-20 | Disposition: A | Discharge status: Home / Self Care | Payer: Medicare Other | Attending: Emergency Medicine | Admitting: Emergency Medicine

## 2014-11-20 ENCOUNTER — Encounter (HOSPITAL_COMMUNITY): Payer: Self-pay | Admitting: Emergency Medicine

## 2014-11-20 DIAGNOSIS — R21 Rash and other nonspecific skin eruption: Secondary | ICD-10-CM | POA: Diagnosis present

## 2014-11-20 DIAGNOSIS — I10 Essential (primary) hypertension: Secondary | ICD-10-CM | POA: Diagnosis not present

## 2014-11-20 DIAGNOSIS — Z87448 Personal history of other diseases of urinary system: Secondary | ICD-10-CM | POA: Diagnosis not present

## 2014-11-20 DIAGNOSIS — B019 Varicella without complication: Secondary | ICD-10-CM | POA: Diagnosis not present

## 2014-11-20 DIAGNOSIS — Z8589 Personal history of malignant neoplasm of other organs and systems: Secondary | ICD-10-CM | POA: Diagnosis not present

## 2014-11-20 DIAGNOSIS — B029 Zoster without complications: Secondary | ICD-10-CM

## 2014-11-20 DIAGNOSIS — K219 Gastro-esophageal reflux disease without esophagitis: Secondary | ICD-10-CM | POA: Insufficient documentation

## 2014-11-20 DIAGNOSIS — Z79899 Other long term (current) drug therapy: Secondary | ICD-10-CM | POA: Diagnosis not present

## 2014-11-20 DIAGNOSIS — Z7952 Long term (current) use of systemic steroids: Secondary | ICD-10-CM | POA: Insufficient documentation

## 2014-11-20 LAB — BASIC METABOLIC PANEL
ANION GAP: 8 (ref 5–15)
BUN: 12 mg/dL (ref 6–20)
CALCIUM: 8.8 mg/dL — AB (ref 8.9–10.3)
CO2: 25 mmol/L (ref 22–32)
CREATININE: 0.73 mg/dL (ref 0.44–1.00)
Chloride: 104 mmol/L (ref 101–111)
Glucose, Bld: 110 mg/dL — ABNORMAL HIGH (ref 70–99)
Potassium: 4.4 mmol/L (ref 3.5–5.1)
Sodium: 137 mmol/L (ref 135–145)

## 2014-11-20 LAB — CBC WITH DIFFERENTIAL/PLATELET
BASOS PCT: 0 % (ref 0–1)
Basophils Absolute: 0 10*3/uL (ref 0.0–0.1)
Eosinophils Absolute: 0 10*3/uL (ref 0.0–0.7)
Eosinophils Relative: 0 % (ref 0–5)
HEMATOCRIT: 30.3 % — AB (ref 36.0–46.0)
HEMOGLOBIN: 9.9 g/dL — AB (ref 12.0–15.0)
Lymphocytes Relative: 8 % — ABNORMAL LOW (ref 12–46)
Lymphs Abs: 0.6 10*3/uL — ABNORMAL LOW (ref 0.7–4.0)
MCH: 35.5 pg — AB (ref 26.0–34.0)
MCHC: 32.7 g/dL (ref 30.0–36.0)
MCV: 108.6 fL — ABNORMAL HIGH (ref 78.0–100.0)
Monocytes Absolute: 0.7 10*3/uL (ref 0.1–1.0)
Monocytes Relative: 9 % (ref 3–12)
NEUTROS ABS: 6.2 10*3/uL (ref 1.7–7.7)
NEUTROS PCT: 83 % — AB (ref 43–77)
PLATELETS: 74 10*3/uL — AB (ref 150–400)
RBC: 2.79 MIL/uL — ABNORMAL LOW (ref 3.87–5.11)
RDW: 18.1 % — ABNORMAL HIGH (ref 11.5–15.5)
WBC: 7.5 10*3/uL (ref 4.0–10.5)

## 2014-11-20 MED ORDER — ONDANSETRON HCL 4 MG/2ML IJ SOLN
4.0000 mg | Freq: Once | INTRAMUSCULAR | Status: AC
  Administered 2014-11-20: 4 mg via INTRAVENOUS
  Filled 2014-11-20: qty 2

## 2014-11-20 MED ORDER — HYDROMORPHONE HCL 1 MG/ML IJ SOLN
1.0000 mg | Freq: Once | INTRAMUSCULAR | Status: AC
  Administered 2014-11-20: 1 mg via INTRAVENOUS
  Filled 2014-11-20: qty 1

## 2014-11-20 MED ORDER — VALACYCLOVIR HCL 1 G PO TABS: 1000.0000 mg | ORAL_TABLET | Freq: Three times a day (TID) | ORAL | Status: AC

## 2014-11-20 MED ORDER — VALACYCLOVIR HCL 500 MG PO TABS
1000.0000 mg | ORAL_TABLET | Freq: Once | ORAL | Status: AC
Start: 2014-11-20 — End: 2014-11-20
  Administered 2014-11-20: 1000 mg via ORAL
  Filled 2014-11-20: qty 2

## 2014-11-20 MED ORDER — MORPHINE SULFATE 4 MG/ML IJ SOLN
4.0000 mg | Freq: Once | INTRAMUSCULAR | Status: AC
  Administered 2014-11-20: 4 mg via INTRAVENOUS
  Filled 2014-11-20: qty 1

## 2014-11-20 MED ORDER — OXYCODONE-ACETAMINOPHEN 5-325 MG PO TABS: 1.0000 | ORAL_TABLET | ORAL | Status: DC | PRN

## 2014-11-20 MED ORDER — HEPARIN SOD (PORK) LOCK FLUSH 100 UNIT/ML IV SOLN
500.0000 [IU] | Freq: Once | INTRAVENOUS | Status: AC
  Administered 2014-11-20: 500 [IU]
  Filled 2014-11-20: qty 5

## 2014-11-20 NOTE — ED Provider Notes (Signed)
CSN: [id number]     Arrival date & time 11/20/14  1242 History  This chart was scribed for Orpah Greek, MD by Starleen Arms, ED Scribe. This patient was seen in room APA10/APA10 and the patient's care was started at 12:49 PM.   Chief Complaint  Patient presents with  . Herpes Zoster   The history is provided by the patient. No language interpreter was used.   HPI Comments: Erica Woodward is a 69 y.o. female who presents to the Emergency Department complaining of a painful, blistering, rash on her right arm, right-sided back, and right chest.  Patient has a history of cancer and finished chemotherapy two weeks ago.  Patient denies fever, vomiting, diarrhea.      Past Medical History  Diagnosis Date  . Renal disorder     kidney transplant 2005  . Hypertension   . Cancer     carcinomatosis  . GERD (gastroesophageal reflux disease)    Past Surgical History  Procedure Laterality Date  . Kidney transplant    . Portacath placement    . Lumbar puncture w/ intrathecal chemotherapy  08/19/14   Family History  Problem Relation Age of Onset  . Kidney disease Father   . Lung cancer Brother   . Seizures Brother   . Seizures Sister    History  Substance Use Topics  . Smoking status: Never Smoker   . Smokeless tobacco: Never Used  . Alcohol Use: No   OB History    No data available     Review of Systems  Skin: Positive for rash.      Allergies  Review of patient's allergies indicates no known allergies.  Home Medications   Prior to Admission medications   Medication Sig Start Date End Date Taking? Authorizing Provider  acetaminophen (TYLENOL) 325 MG tablet Take 2 tablets (650 mg total) by mouth every 6 (six) hours as needed for mild pain (or Fever >/= 101). 06/17/14  Yes Kinnie Feil, MD  atorvastatin (LIPITOR) 40 MG tablet Take 40 mg by mouth daily.   Yes Historical Provider, MD  cetirizine (ZYRTEC) 10 MG tablet Take 10 mg by mouth at bedtime.   Yes Historical  Provider, MD  [employer] SENNA PLUS 8.6-50 MG per tablet Take 1 tablet by mouth 2 (two) times daily as needed for mild constipation.  07/07/14  Yes Historical Provider, MD  docusate sodium (COLACE) 100 MG capsule Take 100 mg by mouth 2 (two) times daily as needed for mild constipation.  06/26/14  Yes Historical Provider, MD  escitalopram (LEXAPRO) 20 MG tablet Take 20 mg by mouth daily.   Yes Historical Provider, MD  esomeprazole (NEXIUM) 40 MG capsule Take 40 mg by mouth daily.   Yes Historical Provider, MD  lidocaine-prilocaine (EMLA) cream Apply 30 min prior to port access 07/29/14  Yes Historical Provider, MD  magnesium oxide (MAG-OX) 400 MG tablet Take 800 mg by mouth 2 (two) times daily.   Yes Historical Provider, MD  ondansetron (ZOFRAN) 8 MG tablet Take 8 mg by mouth daily as needed for nausea or vomiting.  07/07/14  Yes Historical Provider, MD  oxyCODONE (OXY IR/ROXICODONE) 5 MG immediate release tablet Take 5 mg by mouth daily as needed for moderate pain.  07/08/14  Yes Historical Provider, MD  PHOSPHA 250 NEUTRAL [phone] MG tablet Take 1 tablet by mouth 2 (two) times daily. 06/05/14  Yes Historical Provider, MD  predniSONE (DELTASONE) 10 MG tablet Take 10 mg by mouth daily with breakfast.  Yes Historical Provider, MD  prochlorperazine (COMPAZINE) 10 MG tablet Take 10 mg by mouth daily as needed for nausea or vomiting.  07/08/14  Yes Historical Provider, MD  promethazine (PHENERGAN) 12.5 MG tablet Take 12.5 mg by mouth every 6 (six) hours as needed for nausea or vomiting.  06/26/14  Yes Historical Provider, MD  sulfamethoxazole-trimethoprim (BACTRIM DS,SEPTRA DS) 800-160 MG per tablet Take 1 tablet by mouth 3 (three) times a week. Takes on Monday, Wednesday and Friday.   Yes Historical Provider, MD  tacrolimus (PROGRAF) 0.5 MG capsule Take 0.5-1 mg by mouth 2 (two) times daily. 1 CAPSULE IN THE MORNING AND 2 CAPSULES AT NIGHT. 06/28/14  Yes Historical Provider, MD  ondansetron (ZOFRAN) 4 MG/2ML  SOLN injection Inject 2 mLs (4 mg total) into the vein every 8 (eight) hours as needed for nausea or vomiting. Patient not taking: Reported on 08/23/2014 06/17/14   Kinnie Feil, MD  oxyCODONE-acetaminophen (PERCOCET) 5-325 MG per tablet Take 1-2 tablets by mouth every 4 (four) hours as needed. 11/20/14   Orpah Greek, MD  valACYclovir (VALTREX) 1000 MG tablet Take 1 tablet (1,000 mg total) by mouth 3 (three) times daily. 11/20/14 12/04/14  Orpah Greek, MD   BP 111/72 mmHg  Pulse 89  Temp(Src) 98.3 F (36.8 C) (Oral)  Resp 20  Ht 5\' 5"  (1.651 m)  Wt 177 lb (80.287 kg)  BMI 29.45 kg/m2  SpO2 90% Physical Exam  Constitutional: She is oriented to person, place, and time. She appears well-developed and well-nourished. No distress.  HENT:  Head: Normocephalic and atraumatic.  Eyes: Conjunctivae and EOM are normal.  Neck: Neck supple. No tracheal deviation present.  Cardiovascular: Normal rate.   Pulmonary/Chest: Effort normal. No respiratory distress.  Musculoskeletal: Normal range of motion.  Neurological: She is alert and oriented to person, place, and time.  Skin: Skin is warm and dry.  Vesicular rash across right upper back in the right axilla upper arm and right side chest.   Psychiatric: She has a normal mood and affect. Her behavior is normal.  Nursing note and vitals reviewed.   ED Course  Procedures (including critical care time)  DIAGNOSTIC STUDIES: Oxygen Saturation is 98% on RA, normal by my interpretation.    COORDINATION OF CARE:  12:51 PM Discussed treatment plan with patient at bedside.  Patient acknowledges and agrees with plan.    Labs Review Labs Reviewed  CBC WITH DIFFERENTIAL/PLATELET - Abnormal; Notable for the following:    RBC 2.79 (*)    Hemoglobin 9.9 (*)    HCT 30.3 (*)    MCV 108.6 (*)    MCH 35.5 (*)    RDW 18.1 (*)    Platelets 74 (*)    Neutrophils Relative % 83 (*)    Lymphocytes Relative 8 (*)    Lymphs Abs 0.6 (*)    All  other components within normal limits  BASIC METABOLIC PANEL - Abnormal; Notable for the following:    Glucose, Bld 110 (*)    Calcium 8.8 (*)    All other components within normal limits    Imaging Review No results found.   EKG Interpretation None      MDM   Final diagnoses:  Varicella zoster   patient presents to the ER for evaluation of painful rash on her right flank and chest. Morphology is consistent with varicella-zoster. Patient is immunocompromised. She recently completed chemotherapy and also is on Prograf and prednisone because of renal transplant. Because of this,  case was discussed with Dr. Jerilee Hoh, hospitalist. She in turn discussed the case with Dr. Megan Salon, on call for infectious disease. He did not feel that the patient required hospitalization for IV antiviral therapy. He recommended Valtrex 1000 mg 3 times a day and pain medication. Patient will be discharged, follow-up with her oncologist Monday. Return to the ER for worsening symptoms, especially redness, swelling that would represent superinfection.  I personally performed the services described in this documentation, which was scribed in my presence. The recorded information has been reviewed and is accurate.     Orpah Greek, MD 11/25/14 [phone]

## 2014-11-20 NOTE — ED Notes (Addendum)
Patient reports rash to back, axilla, and under right breast. Blistery rash noted, no oozing at this time. Patient also reports pain to same areas as rash.

## 2014-11-20 NOTE — ED Notes (Signed)
Phlebotomy at bedside.

## 2014-11-20 NOTE — Discharge Instructions (Signed)

## 2014-12-02 ENCOUNTER — Ambulatory Visit (HOSPITAL_COMMUNITY): Payer: Self-pay | Admitting: Hematology & Oncology

## 2014-12-06 NOTE — Progress Notes (Signed)
This encounter was created in error - please disregard.

## 2015-06-13 ENCOUNTER — Encounter: Payer: Self-pay | Admitting: Internal Medicine

## 2015-06-27 ENCOUNTER — Encounter: Payer: Self-pay | Admitting: Internal Medicine

## 2015-06-30 ENCOUNTER — Encounter: Payer: Self-pay | Admitting: Internal Medicine

## 2015-07-04 ENCOUNTER — Encounter (HOSPITAL_COMMUNITY)
Admission: RE | Admit: 2015-07-04 | Discharge: 2015-07-04 | Disposition: A | Discharge status: Home / Self Care | Payer: Medicare Other | Source: Ambulatory Visit | Attending: Hematology and Oncology | Admitting: Hematology and Oncology

## 2015-07-04 DIAGNOSIS — C8590 Non-Hodgkin lymphoma, unspecified, unspecified site: Secondary | ICD-10-CM | POA: Diagnosis present

## 2015-07-04 MED ORDER — HEPARIN SOD (PORK) LOCK FLUSH 100 UNIT/ML IV SOLN
INTRAVENOUS | Status: AC
  Filled 2015-07-04: qty 5

## 2015-07-04 MED ORDER — SODIUM CHLORIDE 0.9 % IJ SOLN
10.0000 mL | INTRAMUSCULAR | Status: AC | PRN
  Administered 2015-07-04: 10 mL

## 2015-07-04 MED ORDER — HEPARIN SOD (PORK) LOCK FLUSH 100 UNIT/ML IV SOLN
500.0000 [IU] | INTRAVENOUS | Status: AC | PRN
  Administered 2015-07-04: 500 [IU]

## 2015-08-15 ENCOUNTER — Encounter (HOSPITAL_COMMUNITY)
Admission: RE | Admit: 2015-08-15 | Discharge: 2015-08-15 | Disposition: A | Discharge status: Home / Self Care | Payer: Medicare Other | Source: Ambulatory Visit | Attending: Hematology and Oncology | Admitting: Hematology and Oncology

## 2015-08-15 DIAGNOSIS — C8580 Other specified types of non-Hodgkin lymphoma, unspecified site: Secondary | ICD-10-CM | POA: Diagnosis present

## 2015-08-15 MED ORDER — SODIUM CHLORIDE 0.9% FLUSH
10.0000 mL | INTRAVENOUS | Status: AC | PRN
  Administered 2015-08-15: 10 mL

## 2015-08-15 MED ORDER — HEPARIN SOD (PORK) LOCK FLUSH 100 UNIT/ML IV SOLN
INTRAVENOUS | Status: AC
  Filled 2015-08-15: qty 5

## 2015-08-15 MED ORDER — SODIUM CHLORIDE 0.9% FLUSH
INTRAVENOUS | Status: AC
  Filled 2015-08-15: qty 10

## 2015-08-15 MED ORDER — HEPARIN SOD (PORK) LOCK FLUSH 100 UNIT/ML IV SOLN
500.0000 [IU] | INTRAVENOUS | Status: AC | PRN
  Administered 2015-08-15: 500 [IU]

## 2015-09-26 ENCOUNTER — Encounter (HOSPITAL_COMMUNITY): Payer: Medicare Other

## 2015-10-10 ENCOUNTER — Encounter (HOSPITAL_COMMUNITY)
Admission: RE | Admit: 2015-10-10 | Discharge: 2015-10-10 | Disposition: A | Discharge status: Home / Self Care | Payer: Medicare Other | Source: Ambulatory Visit | Attending: Hematology and Oncology | Admitting: Hematology and Oncology

## 2015-10-10 DIAGNOSIS — C8590 Non-Hodgkin lymphoma, unspecified, unspecified site: Secondary | ICD-10-CM | POA: Diagnosis present

## 2015-10-10 MED ORDER — HEPARIN SOD (PORK) LOCK FLUSH 100 UNIT/ML IV SOLN
INTRAVENOUS | Status: AC
  Filled 2015-10-10: qty 5

## 2015-10-10 MED ORDER — SODIUM CHLORIDE 0.9% FLUSH
10.0000 mL | INTRAVENOUS | Status: AC | PRN
  Administered 2015-10-10: 10 mL

## 2015-10-10 MED ORDER — SODIUM CHLORIDE 0.9% FLUSH
INTRAVENOUS | Status: AC
  Filled 2015-10-10: qty 10

## 2015-10-10 MED ORDER — HEPARIN SOD (PORK) LOCK FLUSH 100 UNIT/ML IV SOLN
500.0000 [IU] | INTRAVENOUS | Status: AC | PRN
  Administered 2015-10-10: 500 [IU]

## 2015-11-21 ENCOUNTER — Encounter (HOSPITAL_COMMUNITY): Payer: Self-pay

## 2015-12-26 ENCOUNTER — Inpatient Hospital Stay (HOSPITAL_COMMUNITY): Admission: RE | Admit: 2015-12-26 | Payer: Self-pay | Source: Ambulatory Visit

## 2016-01-30 ENCOUNTER — Encounter: Payer: Self-pay | Admitting: Internal Medicine

## 2016-02-20 DIAGNOSIS — R202 Paresthesia of skin: Secondary | ICD-10-CM | POA: Diagnosis not present

## 2016-02-20 DIAGNOSIS — C8580 Other specified types of non-Hodgkin lymphoma, unspecified site: Secondary | ICD-10-CM | POA: Diagnosis not present

## 2016-02-23 ENCOUNTER — Encounter: Payer: Self-pay | Admitting: Physician Assistant

## 2016-02-23 ENCOUNTER — Ambulatory Visit (INDEPENDENT_AMBULATORY_CARE_PROVIDER_SITE_OTHER): Payer: Commercial Managed Care - HMO | Admitting: Physician Assistant

## 2016-02-23 VITALS — BP 124/86 | HR 78 | Temp 98.4°F | Resp 16 | Ht 65.0 in | Wt 202.0 lb

## 2016-02-23 DIAGNOSIS — C8590 Non-Hodgkin lymphoma, unspecified, unspecified site: Secondary | ICD-10-CM

## 2016-02-23 DIAGNOSIS — N189 Chronic kidney disease, unspecified: Secondary | ICD-10-CM

## 2016-02-23 DIAGNOSIS — Z1211 Encounter for screening for malignant neoplasm of colon: Secondary | ICD-10-CM | POA: Diagnosis not present

## 2016-02-23 DIAGNOSIS — Z940 Kidney transplant status: Secondary | ICD-10-CM | POA: Diagnosis not present

## 2016-02-23 DIAGNOSIS — H66011 Acute suppurative otitis media with spontaneous rupture of ear drum, right ear: Secondary | ICD-10-CM

## 2016-02-23 DIAGNOSIS — Z1212 Encounter for screening for malignant neoplasm of rectum: Secondary | ICD-10-CM

## 2016-02-23 MED ORDER — AMOXICILLIN-POT CLAVULANATE 875-125 MG PO TABS: 1.0000 | ORAL_TABLET | Freq: Two times a day (BID) | ORAL | 0 refills | Status: DC

## 2016-02-23 NOTE — Progress Notes (Signed)
Patient ID: Erica Woodward MRN: [id number], DOB: 1945/12/25, 70 y.o. Date of Encounter: @DATE @  Chief Complaint:  Chief Complaint  Patient presents with  . Establish Care    Need humana referral    HPI: 70 y.o. year old white female  presents with her daughter for OV today.   She is in the process of becoming a new patient with Dr. Dennard Schaumann. However she is seeing me today because she needs approval of referrals (secondary to her [employer]) for appointments that are already scheduled for the very near future.  She has an appointment for August 16 with Dr. Erling Cruz at [facility].  She has been seeing him in the past and this is just routine follow-up visit that is already scheduled.  She has history of kidney transplant.  She has an appointment to see her oncologist September 11.  Her oncologist is with [facility].  She has history of non-Hodgkin's lymphoma.  She also has an appointment to see Dr. Carlean Purl September 25 at [place].  She has seen him in the past and recently received notice that she is due for her routine colonoscopy and therefore has scheduled that appointment with him for September 25.  Additionally at the end of the visit she reports the following: Says that she has been having drainage from her right ear for 6 or 7 months. Says that she was given amoxicillin 6 or 7 months ago but the ear has continued with this drainage ever since then. Wants to see if I can check her ear while she is here.  No other complaints or concerns.  They report that she was seeing Delia Chimes as her primary care provider but because of change with [employer]/insurance is changing to Dr. Dennard Schaumann.   Past Medical History:  Diagnosis Date  . Cancer [facility])    carcinomatosis  . GERD (gastroesophageal reflux disease)   . Hypertension   . Renal disorder    kidney transplant 2005     Home Meds: Outpatient Medications Prior to Visit    Medication Sig Dispense Refill  . acetaminophen (TYLENOL) 325 MG tablet Take 2 tablets (650 mg total) by mouth every 6 (six) hours as needed for mild pain (or Fever >/= 101).    Marland Kitchen atorvastatin (LIPITOR) 40 MG tablet Take 40 mg by mouth daily.    . cetirizine (ZYRTEC) 10 MG tablet Take 10 mg by mouth at bedtime.    . [employer] SENNA PLUS 8.6-50 MG per tablet Take 1 tablet by mouth 2 (two) times daily as needed for mild constipation.   2  . docusate sodium (COLACE) 100 MG capsule Take 100 mg by mouth 2 (two) times daily as needed for mild constipation.   3  . esomeprazole (NEXIUM) 40 MG capsule Take 40 mg by mouth daily.    . magnesium oxide (MAG-OX) 400 MG tablet Take 800 mg by mouth 2 (two) times daily.    . tacrolimus (PROGRAF) 0.5 MG capsule Take 0.5-1 mg by mouth 2 (two) times daily. 1 CAPSULE IN THE MORNING AND 2 CAPSULES AT NIGHT.    Marland Kitchen ondansetron (ZOFRAN) 4 MG/2ML SOLN injection Inject 2 mLs (4 mg total) into the vein every 8 (eight) hours as needed for nausea or vomiting. (Patient not taking: Reported on 08/23/2014) 2 mL 0  . escitalopram (LEXAPRO) 20 MG tablet Take 20 mg by mouth daily.    Marland Kitchen lidocaine-prilocaine (EMLA) cream Apply 30 min prior to port access    .  ondansetron (ZOFRAN) 8 MG tablet Take 8 mg by mouth daily as needed for nausea or vomiting.     Marland Kitchen oxyCODONE (OXY IR/ROXICODONE) 5 MG immediate release tablet Take 5 mg by mouth daily as needed for moderate pain.     Marland Kitchen oxyCODONE-acetaminophen (PERCOCET) 5-325 MG per tablet Take 1-2 tablets by mouth every 4 (four) hours as needed. (Patient not taking: Reported on 02/23/2016) 30 tablet 0  . PHOSPHA 250 NEUTRAL 155-852-130 MG tablet Take 1 tablet by mouth 2 (two) times daily.  11  . predniSONE (DELTASONE) 10 MG tablet Take 10 mg by mouth daily with breakfast.    . prochlorperazine (COMPAZINE) 10 MG tablet Take 10 mg by mouth daily as needed for nausea or vomiting.     . promethazine (PHENERGAN) 12.5 MG tablet Take 12.5 mg by mouth every 6  (six) hours as needed for nausea or vomiting.   0  . sulfamethoxazole-trimethoprim (BACTRIM DS,SEPTRA DS) 800-160 MG per tablet Take 1 tablet by mouth 3 (three) times a week. Takes on Monday, Wednesday and Friday.     No facility-administered medications prior to visit.     Allergies: No Known Allergies  Social History   Social History  . Marital status: Married    Spouse name: N/A  . Number of children: N/A  . Years of education: N/A   Occupational History  . Not on file.   Social History Main Topics  . Smoking status: Never Smoker  . Smokeless tobacco: Never Used  . Alcohol use No  . Drug use: Unknown  . Sexual activity: Not on file   Other Topics Concern  . Not on file   Social History Narrative  . No narrative on file    Family History  Problem Relation Age of Onset  . Kidney disease Father   . Lung cancer Brother   . Seizures Brother   . Seizures Sister      Review of Systems:  See HPI for pertinent ROS. All other ROS negative.    Physical Exam: Blood pressure 124/86, pulse 78, temperature 98.4 F (36.9 C), temperature source Oral, resp. rate 16, height 5\' 5"  (1.651 m), weight 202 lb (91.6 kg), SpO2 98 %., Body mass index is 33.61 kg/m. General: Obese WF. Appears in no acute distress. Head: Normocephalic, atraumatic, eyes without discharge, sclera non-icteric, nares are without discharge.  Right Ear --- Large amount of purulent drainage deep in ear canal. Ear canal has no erythema, no edema, no pain.  Left Ear--Normal.  Neck: Supple. No thyromegaly. No lymphadenopathy. Lungs: Clear bilaterally to auscultation without wheezes, rales, or rhonchi. Breathing is unlabored. Heart: RRR with S1 S2. No murmurs, rubs, or gallops. Musculoskeletal:  Strength and tone normal for age. Extremities/Skin: Warm and dry. Neuro: Alert and oriented X 3. Moves all extremities spontaneously. Gait is normal. CNII-XII grossly in tact. Psych:  Responds to questions appropriately  with a normal affect.     ASSESSMENT AND PLAN:  70 y.o. year old female with   1. Chronic kidney disease, unspecified stage  2. KIDNEY TRANSPLANTATION, HX OF - Ambulatory referral to Nephrology  3. Non-Hodgkin's lymphoma, unspecified body region, unspecified non-Hodgkin lymphoma type [facility]) - Ambulatory referral to Oncology  4. Screening for colorectal cancer - Ambulatory referral to Gastroenterology  5. Suppurative otitis media of right ear with spontaneous tympanic membrane rupture, recurrence not specified, unspecified chronicity I suspect that she has otitis media with ruptured tympanic membrane. She needs follow-up with ENT. She is to go ahead  and start taking the Augmentin in the interim and follow-up with ENT. - amoxicillin-clavulanate (AUGMENTIN) 875-125 MG tablet; Take 1 tablet by mouth 2 (two) times daily.  Dispense: 20 tablet; Refill: 0 - Ambulatory referral to ENT  She is to schedule office visit with Dr. Dennard Schaumann to establish care in 1-2 weeks, as his schedule allows.   Marin Olp [place], [place], [facility] 02/23/2016 3:24 PM

## 2016-02-29 DIAGNOSIS — C8580 Other specified types of non-Hodgkin lymphoma, unspecified site: Secondary | ICD-10-CM | POA: Diagnosis not present

## 2016-02-29 DIAGNOSIS — R531 Weakness: Secondary | ICD-10-CM | POA: Diagnosis not present

## 2016-02-29 DIAGNOSIS — Z940 Kidney transplant status: Secondary | ICD-10-CM | POA: Diagnosis not present

## 2016-02-29 DIAGNOSIS — M0230 Reiter's disease, unspecified site: Secondary | ICD-10-CM | POA: Diagnosis not present

## 2016-03-08 ENCOUNTER — Encounter: Payer: Self-pay | Admitting: Family Medicine

## 2016-03-08 ENCOUNTER — Ambulatory Visit (INDEPENDENT_AMBULATORY_CARE_PROVIDER_SITE_OTHER): Payer: Commercial Managed Care - HMO | Admitting: Family Medicine

## 2016-03-08 VITALS — BP 128/78 | HR 78 | Temp 97.7°F | Resp 20 | Wt 203.0 lb

## 2016-03-08 DIAGNOSIS — H60391 Other infective otitis externa, right ear: Secondary | ICD-10-CM | POA: Diagnosis not present

## 2016-03-08 MED ORDER — CIPROFLOXACIN-DEXAMETHASONE 0.3-0.1 % OT SUSP: 4.0000 [drp] | Freq: Two times a day (BID) | OTIC | 0 refills | Status: DC

## 2016-03-08 NOTE — Progress Notes (Signed)
Subjective:    Patient ID: Erica Woodward, female    DOB: 10-30-1945, 70 y.o.   MRN: [id number]  HPI  Has been treated For an ear infection twice with Augmentin and amoxicillin. She reports pain and pressure and drainage in her right ear. When I look into her right ear, I am unable to even see the tympanic membrane. The entire inner ear canal is completely occluded with yellow purulent exudate consistent with severe swimmer's ear. Past Medical History:  Diagnosis Date  . 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 GERD (gastroesophageal reflux disease)   . Hypertension   . Renal disorder    kidney transplant 2005   Past Surgical History:  Procedure Laterality Date  . KIDNEY TRANSPLANT    . LUMBAR PUNCTURE W/ INTRATHECAL CHEMOTHERAPY  08/19/14  . PORTACATH PLACEMENT     Current Outpatient Prescriptions on File Prior to Visit  Medication Sig Dispense Refill  . acetaminophen (TYLENOL) 325 MG tablet Take 2 tablets (650 mg total) by mouth every 6 (six) hours as needed for mild pain (or Fever >/= 101).    Marland Kitchen amoxicillin-clavulanate (AUGMENTIN) 875-125 MG tablet Take 1 tablet by mouth 2 (two) times daily. 20 tablet 0  . aspirin EC 81 MG tablet Take 81 mg by mouth daily.    Marland Kitchen atorvastatin (LIPITOR) 40 MG tablet Take 40 mg by mouth daily.    . cetirizine (ZYRTEC) 10 MG tablet Take 10 mg by mouth at bedtime.    . CVS SENNA PLUS 8.6-50 MG per tablet Take 1 tablet by mouth 2 (two) times daily as needed for mild constipation.   2  . docusate sodium (COLACE) 100 MG capsule Take 100 mg by mouth 2 (two) times daily as needed for mild constipation.   3  . esomeprazole (NEXIUM) 40 MG capsule Take 40 mg by mouth daily.    Marland Kitchen gabapentin (NEURONTIN) 300 MG capsule Take 300 mg by mouth 3 (three) times daily.    . magnesium oxide (MAG-OX) 400 MG tablet Take 800 mg by mouth 2 (two) times daily.    . tacrolimus (PROGRAF) 0.5 MG capsule Take 0.5-1 mg by mouth 2 (two) times daily. 1 CAPSULE IN THE MORNING AND 2 CAPSULES  AT NIGHT.    Marland Kitchen ondansetron (ZOFRAN) 4 MG/2ML SOLN injection Inject 2 mLs (4 mg total) into the vein every 8 (eight) hours as needed for nausea or vomiting. (Patient not taking: Reported on 08/23/2014) 2 mL 0   No current facility-administered medications on file prior to visit.    No Known Allergies Social History   Social History  . Marital status: Married    Spouse name: N/A  . Number of children: N/A  . Years of education: N/A   Occupational History  . Not on file.   Social History Main Topics  . Smoking status: Never Smoker  . Smokeless tobacco: Never Used  . Alcohol use No  . Drug use: Unknown  . Sexual activity: Not on file   Other Topics Concern  . Not on file   Social History Narrative  . No narrative on file     Review of Systems  All other systems reviewed and are negative.      Objective:   Physical Exam  HENT:  Right Ear: There is drainage, swelling and tenderness.  Nose: Nose normal.  Mouth/Throat: Oropharynx is clear and moist. No oropharyngeal exudate.  Cardiovascular: Normal rate, regular rhythm and normal heart sounds.   Pulmonary/Chest: Effort normal and  breath sounds normal.  Vitals reviewed.         Assessment & Plan:  Otitis, externa, infective, right - Plan: ciprofloxacin-dexamethasone (CIPRODEX) otic suspension  This appears to be severe otitis externa. Begin Ciprodex 4 drops twice a day for 1 week. Recheck in one week or sooner if worse

## 2016-03-15 ENCOUNTER — Ambulatory Visit: Payer: Commercial Managed Care - HMO | Admitting: Family Medicine

## 2016-03-22 DIAGNOSIS — H7101 Cholesteatoma of attic, right ear: Secondary | ICD-10-CM | POA: Diagnosis not present

## 2016-03-22 DIAGNOSIS — Z940 Kidney transplant status: Secondary | ICD-10-CM | POA: Diagnosis not present

## 2016-03-22 DIAGNOSIS — H9211 Otorrhea, right ear: Secondary | ICD-10-CM | POA: Diagnosis not present

## 2016-03-22 DIAGNOSIS — H90A31 Mixed conductive and sensorineural hearing loss, unilateral, right ear with restricted hearing on the contralateral side: Secondary | ICD-10-CM | POA: Diagnosis not present

## 2016-03-22 DIAGNOSIS — H7201 Central perforation of tympanic membrane, right ear: Secondary | ICD-10-CM | POA: Diagnosis not present

## 2016-03-24 ENCOUNTER — Other Ambulatory Visit: Payer: Self-pay | Admitting: Physician Assistant

## 2016-03-24 DIAGNOSIS — H7101 Cholesteatoma of attic, right ear: Secondary | ICD-10-CM

## 2016-04-02 DIAGNOSIS — M25531 Pain in right wrist: Secondary | ICD-10-CM | POA: Diagnosis not present

## 2016-04-02 DIAGNOSIS — M7989 Other specified soft tissue disorders: Secondary | ICD-10-CM | POA: Diagnosis not present

## 2016-04-02 DIAGNOSIS — I120 Hypertensive chronic kidney disease with stage 5 chronic kidney disease or end stage renal disease: Secondary | ICD-10-CM | POA: Diagnosis not present

## 2016-04-02 DIAGNOSIS — Z08 Encounter for follow-up examination after completed treatment for malignant neoplasm: Secondary | ICD-10-CM | POA: Diagnosis not present

## 2016-04-02 DIAGNOSIS — Z940 Kidney transplant status: Secondary | ICD-10-CM | POA: Diagnosis not present

## 2016-04-02 DIAGNOSIS — Z8572 Personal history of non-Hodgkin lymphomas: Secondary | ICD-10-CM | POA: Diagnosis not present

## 2016-04-02 DIAGNOSIS — N186 End stage renal disease: Secondary | ICD-10-CM | POA: Diagnosis not present

## 2016-04-02 DIAGNOSIS — G629 Polyneuropathy, unspecified: Secondary | ICD-10-CM | POA: Diagnosis not present

## 2016-04-02 DIAGNOSIS — C8330 Diffuse large B-cell lymphoma, unspecified site: Secondary | ICD-10-CM | POA: Diagnosis not present

## 2016-04-02 DIAGNOSIS — E785 Hyperlipidemia, unspecified: Secondary | ICD-10-CM | POA: Diagnosis not present

## 2016-04-02 DIAGNOSIS — K219 Gastro-esophageal reflux disease without esophagitis: Secondary | ICD-10-CM | POA: Diagnosis not present

## 2016-04-09 ENCOUNTER — Encounter: Payer: Self-pay | Admitting: Internal Medicine

## 2016-04-09 ENCOUNTER — Encounter (INDEPENDENT_AMBULATORY_CARE_PROVIDER_SITE_OTHER): Payer: Self-pay

## 2016-04-09 ENCOUNTER — Ambulatory Visit (INDEPENDENT_AMBULATORY_CARE_PROVIDER_SITE_OTHER): Payer: Commercial Managed Care - HMO | Admitting: Internal Medicine

## 2016-04-09 VITALS — BP 114/76 | HR 96 | Ht 62.75 in | Wt 205.2 lb

## 2016-04-09 DIAGNOSIS — K222 Esophageal obstruction: Secondary | ICD-10-CM

## 2016-04-09 DIAGNOSIS — R1314 Dysphagia, pharyngoesophageal phase: Secondary | ICD-10-CM

## 2016-04-09 DIAGNOSIS — R1319 Other dysphagia: Secondary | ICD-10-CM

## 2016-04-09 DIAGNOSIS — R1310 Dysphagia, unspecified: Secondary | ICD-10-CM

## 2016-04-09 DIAGNOSIS — R948 Abnormal results of function studies of other organs and systems: Secondary | ICD-10-CM

## 2016-04-09 NOTE — Progress Notes (Signed)
   Erica Woodward 70 y.o. Jan 31, 1946 [id number]  Assessment & Plan:   Encounter Diagnoses  Name Primary?  . Abnormal PET scan of colon Yes  . Esophageal dysphagia   . Esophageal stricture     ColonoscopyTo evaluate for possible colon neoplasia given the abnormal sigmoid colon on PET scan  EGD and dilation to evaluate and treat dysphagia and esophageal stricture  The risks and benefits as well as alternatives of endoscopic procedure(s) have been discussed and reviewed. All questions answered. The Woodward agrees to proceed.  Remain on a PPI.    Subjective:   Chief Complaint: Dysphagia, need colonoscopy  HPI Woodward is a very nice 70 year old divorced white woman with a history of kidney transplant on chronic immunosuppression since 2005, here with her daughter, it has her oncologist suggested she be seen regarding left lower quadrant pain which is reported to be mild, but also in 2016 she had an abnormal area of uptake on PET scanning. 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 She is not having significant abdominal pain now is only occasional constipation and no rectal bleeding is reported and no significant anemia or new anemia. Previous colonoscopy -2006. She had a EGD with esophageal dilation of a stricture then as well and had good benefit though over the past year or 2 she's had increasing intermittent dysphagia.   Medications, allergies, past medical history, past surgical history, family history and social history are reviewed and updated in the EMR.  Review of Systems Arthritis decreased hearing night sweats insomnia all other review of systems are negative or as per history of present illness  Objective:   Physical Exam @BP  114/76 (BP Location: Left Arm, Woodward Position: Sitting, Cuff Size: Large)   Pulse 96   Ht 5' 2.75" (1.594 m) Comment: height measured without shoes   Wt 205 lb 4 oz (93.1 kg)   BMI 36.65 kg/m @  General:  Well-developed, well-nourished and in no acute distress Eyes:  anicteric.  Neck:   supple w/o thyromegaly or mass.  Lungs: Clear to auscultation bilaterally. Heart:  S1S2, no rubs, murmurs, gallops. Abdomen:  soft, non-tender, no hepatosplenomegaly, hernia, or mass and BS+.  Rectal: deferred Lymph:  no cervical or supraclavicular adenopathy. Extremities:   Mild RUEedema, no cyanosis or clubbing Skin   no rash. Neuro:  A&O x 3.  Psych:  appropriate mood and  Affect.   Data Reviewed:  [facility] oncology notes, her last CBC at [facility] was normal. I reviewed the PET scan report. I reviewed previous EGD and colonoscopy including images.

## 2016-04-09 NOTE — Patient Instructions (Signed)
You have been scheduled for an endoscopy and colonoscopy. Please follow the written instructions given to you at your visit today. Please pick up your prep supplies at the pharmacy. If you use inhalers (even only as needed), please bring them with you on the day of your procedure.   I appreciate the opportunity to care for you. Carl Gessner, MD, FACG 

## 2016-04-10 ENCOUNTER — Encounter: Payer: Self-pay | Admitting: Internal Medicine

## 2016-04-10 DIAGNOSIS — M19049 Primary osteoarthritis, unspecified hand: Secondary | ICD-10-CM | POA: Diagnosis not present

## 2016-04-10 DIAGNOSIS — Z6834 Body mass index (BMI) 34.0-34.9, adult: Secondary | ICD-10-CM | POA: Diagnosis not present

## 2016-04-10 DIAGNOSIS — M1811 Unilateral primary osteoarthritis of first carpometacarpal joint, right hand: Secondary | ICD-10-CM | POA: Diagnosis not present

## 2016-04-10 DIAGNOSIS — M79605 Pain in left leg: Secondary | ICD-10-CM | POA: Diagnosis not present

## 2016-04-10 DIAGNOSIS — M25531 Pain in right wrist: Secondary | ICD-10-CM | POA: Diagnosis not present

## 2016-04-10 DIAGNOSIS — R200 Anesthesia of skin: Secondary | ICD-10-CM | POA: Diagnosis not present

## 2016-04-10 DIAGNOSIS — R202 Paresthesia of skin: Secondary | ICD-10-CM | POA: Diagnosis not present

## 2016-04-10 DIAGNOSIS — M25542 Pain in joints of left hand: Secondary | ICD-10-CM | POA: Diagnosis not present

## 2016-04-10 DIAGNOSIS — M25541 Pain in joints of right hand: Secondary | ICD-10-CM | POA: Diagnosis not present

## 2016-04-10 DIAGNOSIS — I10 Essential (primary) hypertension: Secondary | ICD-10-CM | POA: Diagnosis not present

## 2016-04-10 DIAGNOSIS — M1812 Unilateral primary osteoarthritis of first carpometacarpal joint, left hand: Secondary | ICD-10-CM | POA: Diagnosis not present

## 2016-04-17 ENCOUNTER — Ambulatory Visit
Admission: RE | Admit: 2016-04-17 | Discharge: 2016-04-17 | Disposition: A | Discharge status: Home / Self Care | Payer: Commercial Managed Care - HMO | Source: Ambulatory Visit | Attending: Physician Assistant | Admitting: Physician Assistant

## 2016-04-17 DIAGNOSIS — H7101 Cholesteatoma of attic, right ear: Secondary | ICD-10-CM

## 2016-04-17 DIAGNOSIS — H9203 Otalgia, bilateral: Secondary | ICD-10-CM | POA: Diagnosis not present

## 2016-04-18 ENCOUNTER — Other Ambulatory Visit: Payer: Self-pay | Admitting: Family Medicine

## 2016-04-18 DIAGNOSIS — H524 Presbyopia: Secondary | ICD-10-CM | POA: Diagnosis not present

## 2016-04-18 DIAGNOSIS — Z0100 Encounter for examination of eyes and vision without abnormal findings: Secondary | ICD-10-CM | POA: Diagnosis not present

## 2016-04-18 MED ORDER — ESOMEPRAZOLE MAGNESIUM 40 MG PO CPDR: 40.0000 mg | DELAYED_RELEASE_CAPSULE | Freq: Every day | ORAL | 3 refills | Status: DC

## 2016-04-18 MED ORDER — ATORVASTATIN CALCIUM 40 MG PO TABS: 40.0000 mg | ORAL_TABLET | Freq: Every day | ORAL | 1 refills | Status: DC

## 2016-04-18 NOTE — Telephone Encounter (Signed)
Medication called/sent to requested pharmacy  

## 2016-04-19 ENCOUNTER — Encounter: Payer: Self-pay | Admitting: Family Medicine

## 2016-04-19 ENCOUNTER — Ambulatory Visit (INDEPENDENT_AMBULATORY_CARE_PROVIDER_SITE_OTHER): Payer: Commercial Managed Care - HMO | Admitting: Family Medicine

## 2016-04-19 VITALS — BP 120/68 | HR 78 | Temp 98.2°F | Resp 18 | Wt 204.0 lb

## 2016-04-19 DIAGNOSIS — M62838 Other muscle spasm: Secondary | ICD-10-CM | POA: Diagnosis not present

## 2016-04-19 DIAGNOSIS — Z79899 Other long term (current) drug therapy: Secondary | ICD-10-CM | POA: Diagnosis not present

## 2016-04-19 DIAGNOSIS — Z23 Encounter for immunization: Secondary | ICD-10-CM | POA: Diagnosis not present

## 2016-04-19 LAB — CBC WITH DIFFERENTIAL/PLATELET
Basophils Absolute: 0 cells/uL (ref 0–200)
Basophils Relative: 0 %
EOS ABS: 0 {cells}/uL — AB (ref 15–500)
Eosinophils Relative: 0 %
HEMATOCRIT: 44.8 % (ref 35.0–45.0)
HEMOGLOBIN: 14.8 g/dL (ref 12.0–15.0)
LYMPHS ABS: 1368 {cells}/uL (ref 850–3900)
Lymphocytes Relative: 19 %
MCH: 31.6 pg (ref 27.0–33.0)
MCHC: 33 g/dL (ref 32.0–36.0)
MCV: 95.7 fL (ref 80.0–100.0)
MONO ABS: 216 {cells}/uL (ref 200–950)
MPV: 9.9 fL (ref 7.5–12.5)
Monocytes Relative: 3 %
Neutro Abs: 5616 cells/uL (ref 1500–7800)
Neutrophils Relative %: 78 %
Platelets: 241 10*3/uL (ref 140–400)
RBC: 4.68 MIL/uL (ref 3.80–5.10)
RDW: 14.6 % (ref 11.0–15.0)
WBC: 7.2 10*3/uL (ref 3.8–10.8)

## 2016-04-19 MED ORDER — MECLIZINE HCL 25 MG PO TABS: 25.0000 mg | ORAL_TABLET | Freq: Three times a day (TID) | ORAL | 0 refills | Status: DC | PRN

## 2016-04-19 NOTE — Progress Notes (Signed)
Subjective:    Patient ID: Erica Woodward, female    DOB: Apr 09, 1946, 70 y.o.   MRN: [id number]  HPI Patient has a history of carcinomatosis. She also has a history of neuropathy secondary to chemotherapy. She recently saw her oncologist who recommended a neurology consultation and EMG/nerve conduction studies to determine the etiology of her problem in her upper extremities. Patient reports that she will develop uncontrolled muscle spasms in her hands. The spasms involve all of her fingers including her thumb and her fifth digit. The fingers and hand will distort and contort and rigid muscle contractions outside of her control similar to a cramp. She will physically have to straighten her hand. All the condition is distal to the wrist. However she has a negative Tinel sign. She has a negative Phalen sign. Past Medical History:  Diagnosis Date  . Basal cell carcinoma   . Cancer [facility])    carcinomatosis  . Chronic headaches   . Diverticulosis   . GERD (gastroesophageal reflux disease)   . HLD (hyperlipidemia)   . Hypertension   . Renal disorder    kidney transplant 2005  . Status post dilation of esophageal narrowing    Past Surgical History:  Procedure Laterality Date  . COLONOSCOPY    . KIDNEY TRANSPLANT  07/26/2013  . LUMBAR PUNCTURE W/ INTRATHECAL CHEMOTHERAPY  08/19/14  . PORTACATH PLACEMENT    . UPPER GASTROINTESTINAL ENDOSCOPY     Current Outpatient Prescriptions on File Prior to Visit  Medication Sig Dispense Refill  . aspirin EC 81 MG tablet Take 81 mg by mouth daily.    Marland Kitchen atorvastatin (LIPITOR) 40 MG tablet Take 1 tablet (40 mg total) by mouth daily. 90 tablet 1  . cetirizine (ZYRTEC) 10 MG tablet Take 10 mg by mouth at bedtime.    . CVS SENNA PLUS 8.6-50 MG per tablet Take 1 tablet by mouth 2 (two) times daily as needed for mild constipation.   2  . docusate sodium (COLACE) 100 MG capsule Take 100 mg by mouth 2 (two) times daily as needed for mild constipation.   3  .  esomeprazole (NEXIUM) 40 MG capsule Take 1 capsule (40 mg total) by mouth daily. 90 capsule 3  . gabapentin (NEURONTIN) 300 MG capsule Take 300 mg by mouth 3 (three) times daily.    . magnesium oxide (MAG-OX) 400 MG tablet Take 800 mg by mouth 2 (two) times daily.    . predniSONE (DELTASONE) 10 MG tablet Take 10 mg by mouth daily with breakfast.    . tacrolimus (PROGRAF) 0.5 MG capsule Take 0.5-1 mg by mouth 2 (two) times daily. 1 CAPSULE IN THE MORNING AND 2 CAPSULES AT NIGHT.     No current facility-administered medications on file prior to visit.    No Known Allergies Social History   Social History  . Marital status: Married    Spouse name: N/A  . Number of children: 4  . Years of education: N/A   Occupational History  . retired    Social History Main Topics  . Smoking status: Never Smoker  . Smokeless tobacco: Never Used  . Alcohol use No  . Drug use: No  . Sexual activity: Not on file   Other Topics Concern  . Not on file   Social History Narrative  . No narrative on file      Review of Systems  All other systems reviewed and are negative.      Objective:   Physical Exam  Constitutional: She is oriented to person, place, and time.  Cardiovascular: Normal rate, regular rhythm and normal heart sounds.   Pulmonary/Chest: Effort normal and breath sounds normal. No respiratory distress. She has no wheezes. She has no rales.  Abdominal: Soft. Bowel sounds are normal.  Musculoskeletal: Normal range of motion.  Neurological: She is alert and oriented to person, place, and time. She has normal reflexes. She displays normal reflexes. No cranial nerve deficit. She exhibits normal muscle tone. Coordination normal.  Vitals reviewed.  negative Tinel sign in the right wrist. Negative Phalen sign in the right wrist.        Assessment & Plan:  Night muscle spasms - Plan: CBC with Differential/Platelet, COMPLETE METABOLIC PANEL WITH GFR, Magnesium, Ambulatory referral to  Neurology  Symptoms sound more like cramps I will consult neurology for EMG nerve conduction studies. However there is no evidence of neuropathy or myopathy, I would recommend dedication try to prevent muscle cramps. I will also check a CBC and a CMP to evaluate for electrolyte disturbances including low calcium, low potassium, low magnesium. If lab work and EMG studies are normal, consider hydroxychloroquine for muscle cramps.

## 2016-04-19 NOTE — Addendum Note (Signed)
Addended by: Shary Decamp B on: 04/19/2016 04:00 PM   Modules accepted: Orders

## 2016-04-20 LAB — COMPLETE METABOLIC PANEL WITH GFR
ALT: 15 U/L (ref 6–29)
AST: 18 U/L (ref 10–35)
Albumin: 4.1 g/dL (ref 3.6–5.1)
Alkaline Phosphatase: 94 U/L (ref 33–130)
BUN: 15 mg/dL (ref 7–25)
CALCIUM: 9.4 mg/dL (ref 8.6–10.4)
CO2: 25 mmol/L (ref 20–31)
Chloride: 105 mmol/L (ref 98–110)
Creat: 0.83 mg/dL (ref 0.50–0.99)
GFR, EST NON AFRICAN AMERICAN: 72 mL/min (ref >=60)
GFR, Est African American: 83 mL/min (ref >=60)
Glucose, Bld: 99 mg/dL (ref 70–99)
POTASSIUM: 4.7 mmol/L (ref 3.5–5.3)
Sodium: 140 mmol/L (ref 135–146)
TOTAL PROTEIN: 6.7 g/dL (ref 6.1–8.1)
Total Bilirubin: 0.5 mg/dL (ref 0.2–1.2)

## 2016-04-20 LAB — MAGNESIUM: MAGNESIUM: 1.8 mg/dL (ref 1.5–2.5)

## 2016-04-23 ENCOUNTER — Other Ambulatory Visit: Payer: Self-pay | Admitting: Neurology

## 2016-04-23 DIAGNOSIS — M62838 Other muscle spasm: Secondary | ICD-10-CM

## 2016-04-26 DIAGNOSIS — H90A31 Mixed conductive and sensorineural hearing loss, unilateral, right ear with restricted hearing on the contralateral side: Secondary | ICD-10-CM | POA: Diagnosis not present

## 2016-04-26 DIAGNOSIS — H7191 Unspecified cholesteatoma, right ear: Secondary | ICD-10-CM | POA: Diagnosis not present

## 2016-05-10 ENCOUNTER — Ambulatory Visit (INDEPENDENT_AMBULATORY_CARE_PROVIDER_SITE_OTHER): Payer: Commercial Managed Care - HMO | Admitting: Neurology

## 2016-05-10 DIAGNOSIS — M62838 Other muscle spasm: Secondary | ICD-10-CM | POA: Diagnosis not present

## 2016-05-10 DIAGNOSIS — G5603 Carpal tunnel syndrome, bilateral upper limbs: Secondary | ICD-10-CM

## 2016-05-10 NOTE — Procedures (Addendum)
[facility]  [address]  [place], [place] [address] Tel: [phone] Fax:  [phone] Test Date:  05/10/2016  Patient: Erica Woodward DOB: 04-Oct-1945 Physician: Narda Amber, DO  Sex: Female Height: 5\' 2"  Ref Phys: Jenna Luo, M.D.  ID#: [id number] Temp: 32.6C Technician: Jerilynn Mages. 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   NCV & EMG Findings: Extensive electrodiagnostic testing of the right upper extremity and additional studies of the left shows:  1. Bilateral median sensory responses show prolonged latency (R4.1, L3.9 ms). The median motor amplitude is normal on the right and reduced on the left (8.1 V).  Bilateral ulnar sensory responses are within normal limits. 2. 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  Left median motor nerve showed prolonged distal onset latency (4.2 ms) and normal amplitude. 3. Chronic motor axon loss changes are isolated to the left abductor pollicis brevis, without accompanied active denervation.  Impression: 1. Bilateral median neuropathy at or distal to the wrist, consistent with the clinical diagnosis of carpal tunnel syndrome. Overall, these findings are moderate in degree electrically and worse on the left. 2. Incidentally, there is a right Martin-Gruber anastomosis, a normal variant. 3. There is no evidence of a cervical radiculopathy, diffuse myopathy, or sensorimotor polyneuropathy affecting the upper extremities.   ___________________________ Narda Amber, DO    Nerve Conduction Studies Anti Sensory  Summary Table   Site NR Peak (ms) Norm Peak (ms) P-T Amp (V) Norm P-T Amp  Left Median Anti Sensory (2nd Digit)  32.6C  Wrist    3.9 <3.8 8.1 >10  Right Median Anti Sensory (2nd Digit)  32.6C  Wrist    4.1 <3.8 13.8 >10  Left Ulnar Anti Sensory (5th Digit)  32.6C  Wrist    3.2 <3.2 11.6 >5  Right Ulnar Anti Sensory (5th Digit)  32.6C  Wrist    3.2 <3.2 9.6 >5   Motor Summary Table   Site NR Onset (ms) Norm Onset (ms) O-P Amp (mV) Norm O-P Amp Site1 Site2 Delta-0 (ms) Dist (cm) Vel (m/s) Norm Vel (m/s)  Left Median Motor (Abd Poll Brev)  32.6C  Wrist    4.2 <4.0 5.4 >5 Elbow Wrist 4.9 25.0 51 >50  Elbow    9.1  4.4         Right Median Motor (Abd Poll Brev)  32.6C  Wrist    4.1 <4.0 2.5 >5 Elbow Wrist 4.3 23.0 53 >50  Elbow    8.4  2.2  Ulnar wrist crossover Elbow 3.9 0.0    Ulnar wrist crossover    4.5  5.3         Left Ulnar Motor (Abd Dig Minimi)  32.6C  Wrist    3.1 <3.1 7.7 >7 B Elbow Wrist 3.7 21.0 57 >50  B Elbow    6.8  6.1  A Elbow B Elbow 1.6 10.0 62 >50  A Elbow    8.4  5.6         Right Ulnar Motor (Abd Dig Minimi)  32.6C  Wrist    3.1 <3.1 8.1 >7 B Elbow Wrist 3.7 20.0 54 >50  B Elbow  6.8  7.4  A Elbow B Elbow 1.6 10.0 62 >50  A Elbow    8.4  6.5          EMG   Side Muscle Ins Act Fibs Psw Fasc Number Recrt Dur Dur. Amp Amp. Poly Poly. Comment  Right 1stDorInt Nml Nml Nml Nml Nml Nml Nml Nml Nml Nml Nml Nml N/A  Left 1stDorInt Nml Nml Nml Nml Nml Nml Nml Nml Nml Nml Nml Nml N/A  Left Abd Poll Brev Nml Nml Nml Nml 1- Rapid Some 1+ Some 1+ Nml Nml N/A  Left PronatorTeres Nml Nml Nml Nml Nml Nml Nml Nml Nml Nml Nml Nml N/A  Left Biceps Nml Nml Nml Nml Nml Nml Nml Nml Nml Nml Nml Nml N/A  Left Triceps Nml Nml Nml Nml Nml Nml Nml Nml Nml Nml Nml Nml N/A  Left Deltoid Nml Nml Nml Nml Nml Nml Nml Nml Nml Nml Nml Nml N/A  Right Abd Poll Brev Nml Nml Nml Nml Nml Nml Nml Nml Nml Nml Nml Nml N/A  Right PronatorTeres Nml Nml Nml Nml Nml Nml Nml Nml Nml Nml Nml  Nml N/A  Right Biceps Nml Nml Nml Nml Nml Nml Nml Nml Nml Nml Nml Nml N/A  Right Triceps Nml Nml Nml Nml Nml Nml Nml Nml Nml Nml Nml Nml N/A  Right Deltoid Nml Nml Nml Nml Nml Nml Nml Nml Nml Nml Nml Nml N/A      Waveforms:

## 2016-05-15 ENCOUNTER — Ambulatory Visit (AMBULATORY_SURGERY_CENTER): Payer: Commercial Managed Care - HMO | Admitting: Internal Medicine

## 2016-05-15 ENCOUNTER — Encounter: Payer: Self-pay | Admitting: Internal Medicine

## 2016-05-15 VITALS — BP 126/62 | HR 71 | Temp 97.1°F | Resp 15 | Ht 62.75 in | Wt 205.0 lb

## 2016-05-15 DIAGNOSIS — R933 Abnormal findings on diagnostic imaging of other parts of digestive tract: Secondary | ICD-10-CM | POA: Diagnosis not present

## 2016-05-15 DIAGNOSIS — Z1211 Encounter for screening for malignant neoplasm of colon: Secondary | ICD-10-CM | POA: Diagnosis not present

## 2016-05-15 DIAGNOSIS — K219 Gastro-esophageal reflux disease without esophagitis: Secondary | ICD-10-CM | POA: Diagnosis not present

## 2016-05-15 DIAGNOSIS — R1310 Dysphagia, unspecified: Secondary | ICD-10-CM | POA: Diagnosis not present

## 2016-05-15 DIAGNOSIS — K222 Esophageal obstruction: Secondary | ICD-10-CM

## 2016-05-15 DIAGNOSIS — Z940 Kidney transplant status: Secondary | ICD-10-CM | POA: Diagnosis not present

## 2016-05-15 DIAGNOSIS — D125 Benign neoplasm of sigmoid colon: Secondary | ICD-10-CM | POA: Diagnosis not present

## 2016-05-15 DIAGNOSIS — R948 Abnormal results of function studies of other organs and systems: Secondary | ICD-10-CM | POA: Diagnosis present

## 2016-05-15 MED ORDER — SODIUM CHLORIDE 0.9 % IV SOLN: 500.0000 mL | INTRAVENOUS | Status: DC

## 2016-05-15 NOTE — Op Note (Signed)
[name] Procedure Date: 05/15/2016 2:32 PM MRN: [id number] Endoscopist: Gatha Mayer , MD Age: 70 Referring MD:  Date of Birth: [birth year] Gender: Female Account #: [id number] Procedure:                Upper GI endoscopy Indications:              Dysphagia Medicines:                Propofol per Anesthesia, Monitored Anesthesia Care Procedure:                Pre-Anesthesia Assessment:                           - Prior to the procedure, a History and Physical                            was performed, and patient medications and                            allergies were reviewed. The patient's tolerance of                            previous anesthesia was also reviewed. The risks                            and benefits of the procedure and the sedation                            options and risks were discussed with the patient.                            All questions were answered, and informed consent                            was obtained. Prior Anticoagulants: The patient                            last took aspirin 1 day prior to the procedure. ASA                            Grade Assessment: III - A patient with severe                            systemic disease. After reviewing the risks and                            benefits, the patient was deemed in satisfactory                            condition to undergo the procedure.                           After obtaining informed consent, the endoscope was  passed under direct vision. Throughout the                            procedure, the patient's blood pressure, pulse, and                            oxygen saturations were monitored continuously. The                            Model GIF-HQ190 [id number]) scope was introduced                            through the mouth, and advanced to the second part                            of duodenum. The upper GI  endoscopy was                            accomplished without difficulty. The patient                            tolerated the procedure well. Scope In: Scope Out: Findings:                 One mild benign-appearing, intrinsic stenosis was                            found at the gastroesophageal junction. And was                            traversed. The scope was withdrawn. Dilation was                            performed with a Maloney dilator with mild                            resistance at 23 Fr. The dilation site was examined                            following endoscope reinsertion and showed moderate                            improvement in luminal narrowing. Estimated blood                            loss was minimal. Ring was seen after dilation -                            not appreciated at initial pass of scope - photo                            omitted unintentionally.                           The exam  was otherwise without abnormality.                           The cardia and gastric fundus were normal on                            retroflexion. Complications:            No immediate complications. Estimated Blood Loss:     Estimated blood loss was minimal. Impression:               - Benign-appearing esophageal stenosis. Dilated.                           - The examination was otherwise normal.                           - No specimens collected. Recommendation:           - Patient has a contact number available for                            emergencies. The signs and symptoms of potential                            delayed complications were discussed with the                            patient. Return to normal activities tomorrow.                            Written discharge instructions were provided to the                            patient.                           - Clear liquids x 1 hour then soft foods rest of                            day. Start prior  diet tomorrow.                           - Continue present medications.                           - See the other procedure note for documentation of                            additional recommendations. Gatha Mayer, MD 05/15/2016 3:06:52 PM This report has been signed electronically.

## 2016-05-15 NOTE — Patient Instructions (Addendum)
I dilated the esophagus - I hope that helps you swallow better.  I found and removed a colon polyp - that was seen on the PET scan. It was large but does not look like cancer - will know for sure with pathology analysis.  I will let you know pathology results and when to have another routine colonoscopy by mail.  I appreciate the opportunity to care for you. Gatha Mayer, MD, FACG  YOU HAD AN ENDOSCOPIC PROCEDURE TODAY AT [facility]:   Refer to the procedure report that was given to you for any specific questions about what was found during the examination.  If the procedure report does not answer your questions, please call your gastroenterologist to clarify.  If you requested that your care partner not be given the details of your procedure findings, then the procedure report has been included in a sealed envelope for you to review at your convenience later.  YOU SHOULD EXPECT: Some feelings of bloating in the abdomen. Passage of more gas than usual.  Walking can help get rid of the air that was put into your GI tract during the procedure and reduce the bloating. If you had a lower endoscopy (such as a colonoscopy or flexible sigmoidoscopy) you may notice spotting of blood in your stool or on the toilet paper. If you underwent a bowel prep for your procedure, you may not have a normal bowel movement for a few days.  Please Note:  You might notice some irritation and congestion in your nose or some drainage.  This is from the oxygen used during your procedure.  There is no need for concern and it should clear up in a day or so.  SYMPTOMS TO REPORT IMMEDIATELY:   Following lower endoscopy (colonoscopy or flexible sigmoidoscopy):  Excessive amounts of blood in the stool  Significant tenderness or worsening of abdominal pains  Swelling of the abdomen that is new, acute  Fever of 100F or higher   Following upper endoscopy (EGD)  Vomiting of blood or coffee ground  material  New chest pain or pain under the shoulder blades  Painful or persistently difficult swallowing  New shortness of breath  Fever of 100F or higher  Black, tarry-looking stools  For urgent or emergent issues, a gastroenterologist can be reached at any hour by calling [phone].   DIET: FOLLOW DILATION DIET TODAY!  Drink plenty of fluids but you should avoid alcoholic beverages for 24 hours.  ACTIVITY:  You should plan to take it easy for the rest of today and you should NOT DRIVE or use heavy machinery until tomorrow (because of the sedation medicines used during the test).    FOLLOW UP: Our staff will call the number listed on your records the next business day following your procedure to check on you and address any questions or concerns that you may have regarding the information given to you following your procedure. If we do not reach you, we will leave a message.  However, if you are feeling well and you are not experiencing any problems, there is no need to return our call.  We will assume that you have returned to your regular daily activities without incident.  If any biopsies were taken you will be contacted by phone or by letter within the next 1-3 weeks.  Please call us at [phone] if you have not heard about the biopsies in 3 weeks.    SIGNATURES/CONFIDENTIALITY: You and/or your care  partner have signed paperwork which will be entered into your electronic medical record.  These signatures attest to the fact that that the information above on your After Visit Summary has been reviewed and is understood.  Full responsibility of the confidentiality of this discharge information lies with you and/or your care-partner.  NO ASPIRIN, ASPIRIN CONTAINING MEDICATIONS (BC OR GOODY POWDERS) OR NSAIDS (IBUPROFEN, ADVIL, ALEVE, MOTRIN) FOR 2 WEEKS- TYLENOL IS OK   Please read over handouts about polyps, stricture, diverticulosis and hemorrhoids

## 2016-05-15 NOTE — Op Note (Signed)
[facility] Patient Name: Erica Woodward Procedure Date: 05/15/2016 2:32 PM MRN: [id number] Endoscopist: Gatha Mayer , MD Age: 70 Referring MD:  Date of Birth: 1945-10-01 Gender: Female Account #: [id number] Procedure:                Colonoscopy Indications:              Abnormal PET scan of the GI tract Medicines:                Propofol per Anesthesia, Monitored Anesthesia Care Procedure:                Pre-Anesthesia Assessment:                           - Prior to the procedure, a History and Physical                            was performed, and patient medications and                            allergies were reviewed. The patient's tolerance of                            previous anesthesia was also reviewed. The risks                            and benefits of the procedure and the sedation                            options and risks were discussed with the patient.                            All questions were answered, and informed consent                            was obtained. Prior Anticoagulants: The patient                            last took aspirin 1 day prior to the procedure. ASA                            Grade Assessment: III - A patient with severe                            systemic disease. After reviewing the risks and                            benefits, the patient was deemed in satisfactory                            condition to undergo the procedure.                           - Prior to the procedure, a History and Physical  was performed, and patient medications and                            allergies were reviewed. The patient's tolerance of                            previous anesthesia was also reviewed. The risks                            and benefits of the procedure and the sedation                            options and risks were discussed with the patient.                            All questions were  answered, and informed consent                            was obtained. Prior Anticoagulants: The patient                            last took aspirin 1 day prior to the procedure. ASA                            Grade Assessment: III - A patient with severe                            systemic disease. After reviewing the risks and                            benefits, the patient was deemed in satisfactory                            condition to undergo the procedure.                           After obtaining informed consent, the colonoscope                            was passed under direct vision. Throughout the                            procedure, the patient's blood pressure, pulse, and                            oxygen saturations were monitored continuously. The                            Model PCF-[id number]) scope was introduced                            through the anus and advanced to the the cecum,  identified by appendiceal orifice and ileocecal                            valve. The colonoscopy was performed without                            difficulty. The patient tolerated the procedure                            well. The quality of the bowel preparation was                            adequate. The bowel preparation used was Miralax.                            The ileocecal valve, appendiceal orifice, and                            rectum were photographed. Scope In: 2:41:51 PM Scope Out: 3:00:40 PM Scope Withdrawal Time: 0 hours 14 minutes 35 seconds  Total Procedure Duration: 0 hours 18 minutes 49 seconds  Findings:                 The perianal examination was normal.                           The digital rectal exam findings include decreased                            sphincter tone.                           A 25 mm polyp was found in the sigmoid colon. The                            polyp was pedunculated. The polyp was removed  with                            a hot snare. Resection and retrieval were complete.                            Verification of patient identification for the                            specimen was done. Estimated blood loss: none.                           Multiple small and large-mouthed diverticula were                            found in the sigmoid colon.                           Internal hemorrhoids were found during retroflexion.  The exam was otherwise without abnormality on                            direct and retroflexion views. Complications:            No immediate complications. Estimated Blood Loss:     Estimated blood loss: none. Impression:               - Decreased sphincter tone found on digital rectal                            exam.                           - One 25 mm polyp in the sigmoid colon, removed                            with a hot snare. Resected and retrieved.                           - Diverticulosis in the sigmoid colon.                           - Internal hemorrhoids.                           - The examination was otherwise normal on direct                            and retroflexion views. Recommendation:           - Patient has a contact number available for                            emergencies. The signs and symptoms of potential                            delayed complications were discussed with the                            patient. Return to normal activities tomorrow.                            Written discharge instructions were provided to the                            patient.                           - Clear liquids x 1 hour then soft foods rest of                            day. Start prior diet tomorrow.                           - Continue present medications.                           -  No aspirin, ibuprofen, naproxen, or other                            non-steroidal anti-inflammatory drugs for 2  weeks                            after polyp removal.                           - Repeat colonoscopy is recommended. The                            colonoscopy date will be determined after pathology                            results from today's exam become available for                            review. Gatha Mayer, MD 05/15/2016 3:10:40 PM This report has been signed electronically.

## 2016-05-16 ENCOUNTER — Telehealth: Payer: Self-pay

## 2016-05-16 NOTE — Telephone Encounter (Signed)
  Follow up Call-  Call back number 05/15/2016  Post procedure Call Back phone  # [phone]  Permission to leave phone message Yes  Some recent data might be hidden     Patient questions:  Do you have a fever, pain , or abdominal swelling? No. Pain Score  0 *  Have you tolerated food without any problems? Yes.    Have you been able to return to your normal activities? Yes.    Do you have any questions about your discharge instructions: Diet   No. Medications  No. Follow up visit  No.  Do you have questions or concerns about your Care? No.  Actions: * If pain score is 4 or above: No action needed, pain <4.

## 2016-05-18 ENCOUNTER — Other Ambulatory Visit: Payer: Self-pay | Admitting: Family Medicine

## 2016-05-18 DIAGNOSIS — G5600 Carpal tunnel syndrome, unspecified upper limb: Secondary | ICD-10-CM

## 2016-05-21 ENCOUNTER — Encounter: Payer: Self-pay | Admitting: Internal Medicine

## 2016-05-21 DIAGNOSIS — Z86010 Personal history of colonic polyps: Secondary | ICD-10-CM

## 2016-05-21 DIAGNOSIS — Z860101 Personal history of adenomatous and serrated colon polyps: Secondary | ICD-10-CM

## 2016-05-21 HISTORY — DX: Personal history of adenomatous and serrated colon polyps: Z86.0101

## 2016-05-21 HISTORY — DX: Personal history of colonic polyps: Z86.010

## 2016-05-21 NOTE — Progress Notes (Signed)
25 mm TV adenoma Recall 2020

## 2016-05-29 ENCOUNTER — Telehealth: Payer: Self-pay | Admitting: Internal Medicine

## 2016-05-29 NOTE — Telephone Encounter (Signed)
I reviewed the results and the letter with the patient's daughter.  All questions answered.  She will call back for any additional questions or concerns.

## 2016-05-31 ENCOUNTER — Other Ambulatory Visit: Payer: Self-pay | Admitting: Otolaryngology

## 2016-05-31 DIAGNOSIS — H9211 Otorrhea, right ear: Secondary | ICD-10-CM | POA: Diagnosis not present

## 2016-05-31 DIAGNOSIS — H7191 Unspecified cholesteatoma, right ear: Secondary | ICD-10-CM | POA: Diagnosis not present

## 2016-05-31 DIAGNOSIS — H7101 Cholesteatoma of attic, right ear: Secondary | ICD-10-CM | POA: Diagnosis not present

## 2016-05-31 DIAGNOSIS — H90A31 Mixed conductive and sensorineural hearing loss, unilateral, right ear with restricted hearing on the contralateral side: Secondary | ICD-10-CM | POA: Diagnosis not present

## 2016-06-18 DIAGNOSIS — G5603 Carpal tunnel syndrome, bilateral upper limbs: Secondary | ICD-10-CM | POA: Diagnosis not present

## 2016-06-18 DIAGNOSIS — M79641 Pain in right hand: Secondary | ICD-10-CM | POA: Diagnosis not present

## 2016-06-18 DIAGNOSIS — M79642 Pain in left hand: Secondary | ICD-10-CM | POA: Diagnosis not present

## 2016-06-18 DIAGNOSIS — M180 Bilateral primary osteoarthritis of first carpometacarpal joints: Secondary | ICD-10-CM | POA: Diagnosis not present

## 2016-06-19 DIAGNOSIS — M79642 Pain in left hand: Secondary | ICD-10-CM | POA: Diagnosis not present

## 2016-06-19 DIAGNOSIS — M79641 Pain in right hand: Secondary | ICD-10-CM | POA: Diagnosis not present

## 2016-06-19 DIAGNOSIS — M180 Bilateral primary osteoarthritis of first carpometacarpal joints: Secondary | ICD-10-CM | POA: Diagnosis not present

## 2016-06-20 DIAGNOSIS — M79641 Pain in right hand: Secondary | ICD-10-CM | POA: Diagnosis not present

## 2016-06-20 DIAGNOSIS — M79642 Pain in left hand: Secondary | ICD-10-CM | POA: Diagnosis not present

## 2016-06-20 DIAGNOSIS — M180 Bilateral primary osteoarthritis of first carpometacarpal joints: Secondary | ICD-10-CM | POA: Diagnosis not present

## 2016-06-26 DIAGNOSIS — M79641 Pain in right hand: Secondary | ICD-10-CM | POA: Diagnosis not present

## 2016-06-26 DIAGNOSIS — M180 Bilateral primary osteoarthritis of first carpometacarpal joints: Secondary | ICD-10-CM | POA: Diagnosis not present

## 2016-06-26 DIAGNOSIS — M79642 Pain in left hand: Secondary | ICD-10-CM | POA: Diagnosis not present

## 2016-08-06 DIAGNOSIS — C8330 Diffuse large B-cell lymphoma, unspecified site: Secondary | ICD-10-CM | POA: Diagnosis not present

## 2016-08-23 ENCOUNTER — Ambulatory Visit (INDEPENDENT_AMBULATORY_CARE_PROVIDER_SITE_OTHER): Payer: Medicare HMO | Admitting: Physician Assistant

## 2016-08-23 ENCOUNTER — Encounter: Payer: Self-pay | Admitting: Physician Assistant

## 2016-08-23 VITALS — BP 130/80 | HR 93 | Temp 98.0°F | Resp 18 | Wt 219.6 lb

## 2016-08-23 DIAGNOSIS — M5431 Sciatica, right side: Secondary | ICD-10-CM | POA: Diagnosis not present

## 2016-08-23 MED ORDER — METHYLPREDNISOLONE ACETATE 40 MG/ML IJ SUSP
60.0000 mg | Freq: Once | INTRAMUSCULAR | Status: AC
  Administered 2016-08-23: 60 mg via INTRAMUSCULAR

## 2016-08-23 NOTE — Progress Notes (Signed)
Patient ID: Erica Woodward MRN: [id number], DOB: 1945-12-31, 71 y.o. Date of Encounter: 08/23/2016, 10:48 AM    Chief Complaint:  Chief Complaint  Patient presents with  . right hip pain    slipped on ice x 1wk ago      HPI: 71 y.o. year old female presents with above.   Says that last week she did not know that the deck was covered with ice. She walked out onto the deck to dump a bowl. Slipped on the ice. Fell onto her right side. When I asked where she is feeling pain, she points her finger and draws a line up the lateral aspect of the right leg from ankle up to the thigh. This is where she is feeling the pain. No Cauda equina symptoms. No saddle anesthesia. No weakness in the Leg. Says that she is feeling no pain in her low back. No pain in the pelvis or hip itself.       Home Meds:   Outpatient Medications Prior to Visit  Medication Sig Dispense Refill  . aspirin EC 81 MG tablet Take 81 mg by mouth daily.    Marland Kitchen atorvastatin (LIPITOR) 40 MG tablet Take 1 tablet (40 mg total) by mouth daily. 90 tablet 1  . cetirizine (ZYRTEC) 10 MG tablet Take 10 mg by mouth at bedtime.    . [employer] SENNA PLUS 8.6-50 MG per tablet Take 1 tablet by mouth 2 (two) times daily as needed for mild constipation.   2  . docusate sodium (COLACE) 100 MG capsule Take 100 mg by mouth 2 (two) times daily as needed for mild constipation.   3  . esomeprazole (NEXIUM) 40 MG capsule Take 1 capsule (40 mg total) by mouth daily. 90 capsule 3  . gabapentin (NEURONTIN) 300 MG capsule Take 300 mg by mouth 3 (three) times daily.    . magnesium oxide (MAG-OX) 400 MG tablet Take 800 mg by mouth 2 (two) times daily.    . meclizine (MEDI-MECLIZINE) 25 MG tablet Take 1 tablet (25 mg total) by mouth 3 (three) times daily as needed for dizziness. 30 tablet 0  . predniSONE (DELTASONE) 10 MG tablet Take 10 mg by mouth daily with breakfast.    . tacrolimus (PROGRAF) 0.5 MG capsule Take 0.5-1 mg by mouth 2 (two) times daily. 1  CAPSULE IN THE MORNING AND 2 CAPSULES AT NIGHT.     Facility-Administered Medications Prior to Visit  Medication Dose Route Frequency Provider Last Rate Last Dose  . 0.9 %  sodium chloride infusion  500 mL Intravenous Continuous Gatha Mayer, MD        Allergies: No Known Allergies    Review of Systems: See HPI for pertinent ROS. All other ROS negative.    Physical Exam: Blood pressure 130/80, pulse 93, temperature 98 F (36.7 C), temperature source Oral, resp. rate 18, weight 219 lb 9.6 oz (99.6 kg), SpO2 95 %., Body mass index is 39.21 kg/m. General:  WF Appears in no acute distress. Neck: Supple. No thyromegaly. No lymphadenopathy. Lungs: Clear bilaterally to auscultation without wheezes, rales, or rhonchi. Breathing is unlabored. Heart: Regular rhythm. No murmurs, rubs, or gallops. Msk:  No tenderness with palpation to the low back. Hip range of motion is intact. Right straight leg raise is limited secondary to pain in the right low back and down the leg. Right hip abduction is limited secondary to pain in the right lateral thigh region. Extremities/Skin: Warm and dry.  Neuro: Alert and  oriented X 3. Moves all extremities spontaneously. Gait is normal. CNII-XII grossly in tact. Psych:  Responds to questions appropriately with a normal affect.     ASSESSMENT AND PLAN:  71 y.o. year old female with  1. Sciatica, right side Discussed oral prednisone taper versus injection. She prefers injection. Will give Depo-Medrol 60 mg IM now. Follow-up if symptoms do not resolve within 1 week.   Signed, [address] [place], [place], [facility] 08/23/2016 10:48 AM

## 2016-08-23 NOTE — Addendum Note (Signed)
Addended by: Vonna Kotyk A on: 08/23/2016 12:11 PM   Modules accepted: Orders

## 2016-09-12 DIAGNOSIS — E669 Obesity, unspecified: Secondary | ICD-10-CM | POA: Diagnosis not present

## 2016-09-12 DIAGNOSIS — Z940 Kidney transplant status: Secondary | ICD-10-CM | POA: Diagnosis not present

## 2016-09-12 DIAGNOSIS — C8580 Other specified types of non-Hodgkin lymphoma, unspecified site: Secondary | ICD-10-CM | POA: Diagnosis not present

## 2016-09-24 ENCOUNTER — Other Ambulatory Visit: Payer: Self-pay | Admitting: Family Medicine

## 2016-10-01 DIAGNOSIS — Z8669 Personal history of other diseases of the nervous system and sense organs: Secondary | ICD-10-CM | POA: Diagnosis not present

## 2016-10-01 DIAGNOSIS — H906 Mixed conductive and sensorineural hearing loss, bilateral: Secondary | ICD-10-CM | POA: Diagnosis not present

## 2016-10-01 DIAGNOSIS — H7191 Unspecified cholesteatoma, right ear: Secondary | ICD-10-CM | POA: Diagnosis not present

## 2016-10-01 DIAGNOSIS — H95191 Other disorders following mastoidectomy, right ear: Secondary | ICD-10-CM | POA: Diagnosis not present

## 2016-10-01 DIAGNOSIS — H73012 Bullous myringitis, left ear: Secondary | ICD-10-CM | POA: Diagnosis not present

## 2016-10-03 ENCOUNTER — Other Ambulatory Visit: Payer: Self-pay | Admitting: Family Medicine

## 2016-10-03 MED ORDER — GABAPENTIN 300 MG PO CAPS: 300.0000 mg | ORAL_CAPSULE | Freq: Three times a day (TID) | ORAL | 3 refills | Status: AC

## 2016-10-26 DIAGNOSIS — H9211 Otorrhea, right ear: Secondary | ICD-10-CM | POA: Diagnosis not present

## 2016-10-26 DIAGNOSIS — H906 Mixed conductive and sensorineural hearing loss, bilateral: Secondary | ICD-10-CM | POA: Diagnosis not present

## 2016-10-26 DIAGNOSIS — Z8669 Personal history of other diseases of the nervous system and sense organs: Secondary | ICD-10-CM | POA: Diagnosis not present

## 2016-11-08 ENCOUNTER — Encounter: Payer: Self-pay | Admitting: Family Medicine

## 2016-12-03 DIAGNOSIS — C8330 Diffuse large B-cell lymphoma, unspecified site: Secondary | ICD-10-CM | POA: Diagnosis not present

## 2016-12-05 DIAGNOSIS — H906 Mixed conductive and sensorineural hearing loss, bilateral: Secondary | ICD-10-CM | POA: Diagnosis not present

## 2016-12-05 DIAGNOSIS — H7191 Unspecified cholesteatoma, right ear: Secondary | ICD-10-CM | POA: Diagnosis not present

## 2016-12-29 ENCOUNTER — Encounter (HOSPITAL_COMMUNITY): Payer: Self-pay | Admitting: Emergency Medicine

## 2016-12-29 ENCOUNTER — Emergency Department (HOSPITAL_COMMUNITY)
Admission: EM | Admit: 2016-12-29 | Discharge: 2016-12-30 | Disposition: A | Discharge status: Home / Self Care | Payer: Medicare HMO | Source: Home / Self Care | Attending: Emergency Medicine | Admitting: Emergency Medicine

## 2016-12-29 DIAGNOSIS — C800 Disseminated malignant neoplasm, unspecified: Secondary | ICD-10-CM | POA: Insufficient documentation

## 2016-12-29 DIAGNOSIS — Z79899 Other long term (current) drug therapy: Secondary | ICD-10-CM | POA: Insufficient documentation

## 2016-12-29 DIAGNOSIS — C8599 Non-Hodgkin lymphoma, unspecified, extranodal and solid organ sites: Secondary | ICD-10-CM

## 2016-12-29 DIAGNOSIS — C8590 Non-Hodgkin lymphoma, unspecified, unspecified site: Secondary | ICD-10-CM | POA: Diagnosis not present

## 2016-12-29 DIAGNOSIS — I10 Essential (primary) hypertension: Secondary | ICD-10-CM

## 2016-12-29 DIAGNOSIS — R1012 Left upper quadrant pain: Secondary | ICD-10-CM | POA: Insufficient documentation

## 2016-12-29 DIAGNOSIS — Z7982 Long term (current) use of aspirin: Secondary | ICD-10-CM | POA: Insufficient documentation

## 2016-12-29 DIAGNOSIS — C4491 Basal cell carcinoma of skin, unspecified: Secondary | ICD-10-CM | POA: Insufficient documentation

## 2016-12-29 DIAGNOSIS — C8593 Non-Hodgkin lymphoma, unspecified, intra-abdominal lymph nodes: Secondary | ICD-10-CM

## 2016-12-29 LAB — COMPREHENSIVE METABOLIC PANEL
ALT: 16 U/L (ref 14–54)
ANION GAP: 7 (ref 5–15)
AST: 24 U/L (ref 15–41)
Albumin: 3.6 g/dL (ref 3.5–5.0)
Alkaline Phosphatase: 84 U/L (ref 38–126)
BUN: 12 mg/dL (ref 6–20)
CHLORIDE: 107 mmol/L (ref 101–111)
CO2: 24 mmol/L (ref 22–32)
Calcium: 9 mg/dL (ref 8.9–10.3)
Creatinine, Ser: 1.06 mg/dL — ABNORMAL HIGH (ref 0.44–1.00)
GFR calc Af Amer: >60 mL/min (ref >60)
GFR calc non Af Amer: 52 mL/min — ABNORMAL LOW (ref >60)
Glucose, Bld: 112 mg/dL — ABNORMAL HIGH (ref 65–99)
Potassium: 4.2 mmol/L (ref 3.5–5.1)
SODIUM: 138 mmol/L (ref 135–145)
Total Bilirubin: 0.5 mg/dL (ref 0.3–1.2)
Total Protein: 6.4 g/dL — ABNORMAL LOW (ref 6.5–8.1)

## 2016-12-29 LAB — CBC
HCT: 41.7 % (ref 36.0–46.0)
HEMOGLOBIN: 13.6 g/dL (ref 12.0–15.0)
MCH: 31.1 pg (ref 26.0–34.0)
MCHC: 32.6 g/dL (ref 30.0–36.0)
MCV: 95.2 fL (ref 78.0–100.0)
Platelets: 176 10*3/uL (ref 150–400)
RBC: 4.38 MIL/uL (ref 3.87–5.11)
RDW: 13.8 % (ref 11.5–15.5)
WBC: 6.5 10*3/uL (ref 4.0–10.5)

## 2016-12-29 LAB — URINALYSIS, ROUTINE W REFLEX MICROSCOPIC
Bilirubin Urine: NEGATIVE
GLUCOSE, UA: NEGATIVE mg/dL
HGB URINE DIPSTICK: NEGATIVE
Ketones, ur: NEGATIVE mg/dL
Leukocytes, UA: NEGATIVE
Nitrite: NEGATIVE
PH: 5 (ref 5.0–8.0)
Protein, ur: NEGATIVE mg/dL
SPECIFIC GRAVITY, URINE: 1.025 (ref 1.005–1.030)

## 2016-12-29 LAB — LIPASE, BLOOD: LIPASE: 18 U/L (ref 11–51)

## 2016-12-29 MED ORDER — MORPHINE SULFATE (PF) 4 MG/ML IV SOLN
4.0000 mg | Freq: Once | INTRAVENOUS | Status: AC
  Administered 2016-12-30: 4 mg via INTRAVENOUS
  Filled 2016-12-29: qty 1

## 2016-12-29 NOTE — ED Notes (Signed)
Attempted to get blood, but was unsuccessful. 

## 2016-12-29 NOTE — ED Triage Notes (Signed)
Pt complaint of LLQ abd pain radiating to back; pt denies n/v/d or GU symptoms.

## 2016-12-29 NOTE — ED Provider Notes (Signed)
[facility] DEPT Provider Note   CSN: [id number] Arrival date & time: 12/29/16  1903     History   Chief Complaint Chief Complaint  Patient presents with  . Abdominal Pain    HPI Erica Woodward is a 71 y.o. female.  Patient with history of GERD, HLD, HTN, kidney transplant (2005), non-Hodgkin's lymphoma (in remission, last chemo 8938), SBO (complication of tumor) presents with onset abdominal pain yesterday. She reports pain increases and decreases but does not completely resolve. No nausea or vomiting and no fever. She had her last bowel movement yesterday morning prior to the onset of pain and she reports it as normal. No recent issues with constipation or diarrhea. No melena or BRB per rectum. Her transplanted kidney is in the RLQ abdomen and pain is located on the LUQ and left mid-abdomen.    The history is provided by the patient. No language interpreter was used.  Abdominal Pain   Pertinent negatives include fever, nausea, vomiting, constipation, dysuria and frequency.    Past Medical History:  Diagnosis Date  . Basal cell carcinoma   . Cancer [facility])    carcinomatosis  . Chronic headaches   . Diverticulosis   . GERD (gastroesophageal reflux disease)   . HLD (hyperlipidemia)   . Hx of adenomatous polyp of colon 05/21/2016  . Hypertension   . Renal disorder    kidney transplant 2005  . Status post dilation of esophageal narrowing     Patient Active Problem List   Diagnosis Date Noted  . Hx of adenomatous polyp of colon 05/21/2016  . NHL (non-Hodgkin's lymphoma) ([place]) 08/02/2014  . GERD (gastroesophageal reflux disease) 06/16/2014  . Carcinomatosis ([place])   . Abdominal pain 06/15/2014  . KIDNEY TRANSPLANTATION, HX OF 07/21/2010  . HLD (hyperlipidemia) 01/17/2010  . GOUT 01/17/2010  . ACUTE SEROUS OTITIS MEDIA 01/17/2010  . OTITIS MEDIA, ACUTE WITH RUPTURE OF TYMPANIC MEMBRANE 01/17/2010  . Essential hypertension 01/17/2010  . KNEE INJURY, RIGHT 01/17/2010     Past Surgical History:  Procedure Laterality Date  . COLONOSCOPY    . KIDNEY TRANSPLANT  07/26/2013  . LUMBAR PUNCTURE W/ INTRATHECAL CHEMOTHERAPY  08/19/14  . PORTACATH PLACEMENT    . UPPER GASTROINTESTINAL ENDOSCOPY      OB History    No data available       Home Medications    Prior to Admission medications   Medication Sig Start Date End Date Taking? Authorizing Provider  aspirin EC 81 MG tablet Take 81 mg by mouth daily.    [provider]  atorvastatin (LIPITOR) 40 MG tablet TAKE 1 TABLET (40 MG TOTAL) BY MOUTH DAILY. 09/25/16   Susy Frizzle, MD  cetirizine (ZYRTEC) 10 MG tablet Take 10 mg by mouth at bedtime.    [provider]  [facility] SENNA PLUS 8.6-50 MG per tablet Take 1 tablet by mouth 2 (two) times daily as needed for mild constipation.  07/07/14   [provider]  docusate sodium (COLACE) 100 MG capsule Take 100 mg by mouth 2 (two) times daily as needed for mild constipation.  06/26/14   [provider]  esomeprazole (NEXIUM) 40 MG capsule Take 1 capsule (40 mg total) by mouth daily. 04/18/16   Susy Frizzle, MD  gabapentin (NEURONTIN) 300 MG capsule Take 1 capsule (300 mg total) by mouth 3 (three) times daily. 10/03/16   Susy Frizzle, MD  magnesium oxide (MAG-OX) 400 MG tablet Take 800 mg by mouth 2 (two) times daily.  [provider]  meclizine (MEDI-MECLIZINE) 25 MG tablet Take 1 tablet (25 mg total) by mouth 3 (three) times daily as needed for dizziness. 04/19/16   Susy Frizzle, MD  predniSONE (DELTASONE) 10 MG tablet Take 10 mg by mouth daily with breakfast.    [provider]  tacrolimus (PROGRAF) 0.5 MG capsule Take 0.5-1 mg by mouth 2 (two) times daily. 1 CAPSULE IN THE MORNING AND 2 CAPSULES AT NIGHT. 06/28/14   [provider]    Family History Family History  Problem Relation Age of Onset  . Kidney disease Father   . Diabetes Father   . Lung cancer Brother   . Seizures  Brother   . Seizures Sister   . Breast cancer Daughter   . Heart disease Brother     Social History Social History  Substance Use Topics  . Smoking status: Never Smoker  . Smokeless tobacco: Never Used  . Alcohol use No     Allergies   Patient has no known allergies.   Review of Systems Review of Systems  Constitutional: Negative for chills and fever.  HENT: Negative.   Respiratory: Negative.   Cardiovascular: Negative.   Gastrointestinal: Positive for abdominal pain. Negative for blood in stool, constipation, nausea and vomiting.  Genitourinary: Negative.  Negative for difficulty urinating, dysuria and frequency.  Musculoskeletal: Negative.   Skin: Negative.   Neurological: Negative.      Physical Exam Updated Vital Signs BP 125/68 (BP Location: Left Arm)   Pulse 100   Temp 98.3 F (36.8 C) (Oral)   Resp 20   Ht 5\' 4"  (1.626 m)   Wt 106.6 kg (235 lb)   SpO2 97%   BMI 40.34 kg/m   Physical Exam  Constitutional: She appears well-developed and well-nourished.  HENT:  Head: Normocephalic.  Neck: Normal range of motion. Neck supple.  Cardiovascular: Normal rate and regular rhythm.   Pulmonary/Chest: Effort normal and breath sounds normal.  Abdominal: Soft. There is tenderness (Tenderness is mild.). There is no rebound and no guarding.    Bowel sounds are hypoactive.   Musculoskeletal: Normal range of motion.  Neurological: She is alert. No cranial nerve deficit.  Skin: Skin is warm and dry.  Psychiatric: She has a normal mood and affect.     ED Treatments / Results  Labs (all labs ordered are listed, but only abnormal results are displayed) Labs Reviewed  CBC  LIPASE, BLOOD  COMPREHENSIVE METABOLIC PANEL  URINALYSIS, ROUTINE W REFLEX MICROSCOPIC    EKG  EKG Interpretation None       Radiology No results found. Results for orders placed or performed during the hospital encounter of 12/29/16  Lipase, blood  Result Value Ref Range    Lipase 18 11 - 51 U/L  Comprehensive metabolic panel  Result Value Ref Range   Sodium 138 135 - 145 mmol/L   Potassium 4.2 3.5 - 5.1 mmol/L   Chloride 107 101 - 111 mmol/L   CO2 24 22 - 32 mmol/L   Glucose, Bld 112 (H) 65 - 99 mg/dL   BUN 12 6 - 20 mg/dL   Creatinine, Ser 1.06 (H) 0.44 - 1.00 mg/dL   Calcium 9.0 8.9 - 10.3 mg/dL   Total Protein 6.4 (L) 6.5 - 8.1 g/dL   Albumin 3.6 3.5 - 5.0 g/dL   AST 24 15 - 41 U/L   ALT 16 14 - 54 U/L   Alkaline Phosphatase 84 38 - 126 U/L   Total Bilirubin 0.5  0.3 - 1.2 mg/dL   GFR calc non Af Amer 52 (L) >60 mL/min   GFR calc Af Amer >60 >60 mL/min   Anion gap 7 5 - 15  CBC  Result Value Ref Range   WBC 6.5 4.0 - 10.5 K/uL   RBC 4.38 3.87 - 5.11 MIL/uL   Hemoglobin 13.6 12.0 - 15.0 g/dL   HCT 41.7 36.0 - 46.0 %   MCV 95.2 78.0 - 100.0 fL   MCH 31.1 26.0 - 34.0 pg   MCHC 32.6 30.0 - 36.0 g/dL   RDW 13.8 11.5 - 15.5 %   Platelets 176 150 - 400 K/uL  Urinalysis, Routine w reflex microscopic  Result Value Ref Range   Color, Urine YELLOW YELLOW   APPearance CLEAR CLEAR   Specific Gravity, Urine 1.025 1.005 - 1.030   pH 5.0 5.0 - 8.0   Glucose, UA NEGATIVE NEGATIVE mg/dL   Hgb urine dipstick NEGATIVE NEGATIVE   Bilirubin Urine NEGATIVE NEGATIVE   Ketones, ur NEGATIVE NEGATIVE mg/dL   Protein, ur NEGATIVE NEGATIVE mg/dL   Nitrite NEGATIVE NEGATIVE   Leukocytes, UA NEGATIVE NEGATIVE   Ct Abdomen Pelvis W Contrast  Result Date: 12/30/2016 CLINICAL DATA:  Left lower quadrant abdominal pain radiating to the back EXAM: CT ABDOMEN AND PELVIS WITH CONTRAST TECHNIQUE: Multidetector CT imaging of the abdomen and pelvis was performed using the standard protocol following bolus administration of intravenous contrast. CONTRAST:  100 mL Isovue 300 intravenous COMPARISON:  Radiograph 06/29/2014, CT 06/28/2014 FINDINGS: Lower chest: Lung bases demonstrate no acute consolidation or pleural effusion. Mild coronary artery calcification. Small distal  esophageal hiatal hernia. Hepatobiliary: Stable cyst in the left hepatic lobe. No calcified gallstones. No biliary dilatation. Pancreas: Unremarkable. No pancreatic ductal dilatation or surrounding inflammatory changes. Spleen: Normal in size without focal abnormality. Adrenals/Urinary Tract: Adrenal glands are within normal limits. Atrophic native kidneys with no hydronephrosis. Unremarkable appearing right lower quadrant renal transplant. Bladder normal Stomach/Bowel: The stomach is nonenlarged. No dilated small bowel. No colon wall thickening. Vascular/Lymphatic: Aortic atherosclerosis. No aneurysmal dilatation. Multiple enlarged retroperitoneal lymph nodes. These measure up to 1.7 cm in diameter. Small nodules are nodes anterior to the aortic bifurcation. Lymph nodes adjacent to the left common iliac vessel. Left pelvic sidewall adenopathy measuring up to 1.7 cm. Presacral nodule measuring 1.8 cm with adjacent smaller presacral nodules noted. Reproductive: Uterus unremarkable.  3 cm right ovarian cyst Other: No free air or free fluid Musculoskeletal: Grade 1 anterolisthesis of L4 on L5. No acute or suspicious bone lesion IMPRESSION: 1. No acute abnormality is visualized 2. Interim development of retroperitoneal, iliac chain, and left pelvic sidewall adenopathy. Multiple soft tissue nodules in the presacral space. Findings are concerning for neoplasm, possible metastatic disease or lymphoma. Correlation with PET-CT is suggested. 3. 3 cm right ovarian cyst. Electronically Signed   By: Donavan Foil M.D.   On: 12/30/2016 03:13    Procedures Procedures (including critical care time)  Medications Ordered in ED Medications - No data to display   Initial Impression / Assessment and Plan / ED Course  I have reviewed the triage vital signs and the nursing notes.  Pertinent labs & imaging results that were available during my care of the patient were reviewed by me and considered in my medical decision making  (see chart for details).     Patient presents with abdominal pain on left that started yesterday. She has a complicated medical history involving lymphoma with abdominal tumor, SBO secondary to tumor, Kidney  transplant.   The patient declines pain medications. She is resting comfortably.   CT scan delayed due to census. When resulted it shows findings consistent with recurrence of lymphoma. Dr. Tyrone Nine has seen the patient and results were discussed. She is felt stable and appropriate for discharge home with oncology follow up.  Final Clinical Impressions(s) / ED Diagnoses   Final diagnoses:  None   1. Abdominal pain 2. Lymphoma, recurrent  New Prescriptions New Prescriptions   No medications on file     Charlann Lange, Hershal Coria 12/30/16 0351    Veryl Speak, MD 01/02/17 [phone]

## 2016-12-30 ENCOUNTER — Encounter (HOSPITAL_COMMUNITY): Payer: Self-pay | Admitting: Emergency Medicine

## 2016-12-30 ENCOUNTER — Inpatient Hospital Stay (HOSPITAL_COMMUNITY)
Admission: EM | Admit: 2016-12-30 | Discharge: 2017-01-03 | DRG: 699 | Disposition: A | Discharge status: Home / Self Care | Payer: Medicare HMO | Attending: Internal Medicine | Admitting: Internal Medicine

## 2016-12-30 ENCOUNTER — Emergency Department (HOSPITAL_COMMUNITY): Payer: Medicare HMO

## 2016-12-30 ENCOUNTER — Encounter (HOSPITAL_COMMUNITY): Payer: Self-pay | Admitting: Radiology

## 2016-12-30 DIAGNOSIS — C8590 Non-Hodgkin lymphoma, unspecified, unspecified site: Secondary | ICD-10-CM | POA: Diagnosis present

## 2016-12-30 DIAGNOSIS — I10 Essential (primary) hypertension: Secondary | ICD-10-CM | POA: Diagnosis present

## 2016-12-30 DIAGNOSIS — E875 Hyperkalemia: Secondary | ICD-10-CM

## 2016-12-30 DIAGNOSIS — Z8572 Personal history of non-Hodgkin lymphomas: Secondary | ICD-10-CM

## 2016-12-30 DIAGNOSIS — M79673 Pain in unspecified foot: Secondary | ICD-10-CM

## 2016-12-30 DIAGNOSIS — R591 Generalized enlarged lymph nodes: Secondary | ICD-10-CM

## 2016-12-30 DIAGNOSIS — Z85828 Personal history of other malignant neoplasm of skin: Secondary | ICD-10-CM

## 2016-12-30 DIAGNOSIS — M109 Gout, unspecified: Secondary | ICD-10-CM | POA: Diagnosis present

## 2016-12-30 DIAGNOSIS — Y830 Surgical operation with transplant of whole organ as the cause of abnormal reaction of the patient, or of later complication, without mention of misadventure at the time of the procedure: Secondary | ICD-10-CM | POA: Diagnosis present

## 2016-12-30 DIAGNOSIS — Z940 Kidney transplant status: Secondary | ICD-10-CM

## 2016-12-30 DIAGNOSIS — R4182 Altered mental status, unspecified: Secondary | ICD-10-CM

## 2016-12-30 DIAGNOSIS — N179 Acute kidney failure, unspecified: Secondary | ICD-10-CM

## 2016-12-30 DIAGNOSIS — R7989 Other specified abnormal findings of blood chemistry: Secondary | ICD-10-CM

## 2016-12-30 DIAGNOSIS — R778 Other specified abnormalities of plasma proteins: Secondary | ICD-10-CM

## 2016-12-30 DIAGNOSIS — M79606 Pain in leg, unspecified: Secondary | ICD-10-CM | POA: Diagnosis not present

## 2016-12-30 DIAGNOSIS — Z7982 Long term (current) use of aspirin: Secondary | ICD-10-CM

## 2016-12-30 DIAGNOSIS — G9340 Encephalopathy, unspecified: Secondary | ICD-10-CM

## 2016-12-30 DIAGNOSIS — E860 Dehydration: Secondary | ICD-10-CM | POA: Diagnosis present

## 2016-12-30 DIAGNOSIS — E785 Hyperlipidemia, unspecified: Secondary | ICD-10-CM | POA: Diagnosis present

## 2016-12-30 DIAGNOSIS — K7290 Hepatic failure, unspecified without coma: Secondary | ICD-10-CM | POA: Diagnosis present

## 2016-12-30 DIAGNOSIS — R0902 Hypoxemia: Secondary | ICD-10-CM | POA: Diagnosis not present

## 2016-12-30 DIAGNOSIS — Z79899 Other long term (current) drug therapy: Secondary | ICD-10-CM

## 2016-12-30 DIAGNOSIS — T8619 Other complication of kidney transplant: Principal | ICD-10-CM | POA: Diagnosis present

## 2016-12-30 DIAGNOSIS — R599 Enlarged lymph nodes, unspecified: Secondary | ICD-10-CM

## 2016-12-30 DIAGNOSIS — K219 Gastro-esophageal reflux disease without esophagitis: Secondary | ICD-10-CM | POA: Diagnosis present

## 2016-12-30 DIAGNOSIS — R109 Unspecified abdominal pain: Secondary | ICD-10-CM | POA: Diagnosis not present

## 2016-12-30 MED ORDER — ONDANSETRON HCL 4 MG/2ML IJ SOLN
INTRAMUSCULAR | Status: AC
  Administered 2016-12-30: 4 mg via INTRAVENOUS
  Filled 2016-12-30: qty 2

## 2016-12-30 MED ORDER — SODIUM CHLORIDE 0.9 % IV BOLUS (SEPSIS)
500.0000 mL | Freq: Once | INTRAVENOUS | Status: AC
  Administered 2016-12-30: 500 mL via INTRAVENOUS

## 2016-12-30 MED ORDER — ONDANSETRON HCL 4 MG/2ML IJ SOLN
4.0000 mg | Freq: Once | INTRAMUSCULAR | Status: AC
  Administered 2016-12-30: 4 mg via INTRAVENOUS

## 2016-12-30 MED ORDER — ONDANSETRON HCL 4 MG/2ML IJ SOLN
4.0000 mg | Freq: Once | INTRAMUSCULAR | Status: AC
  Administered 2016-12-30: 4 mg via INTRAVENOUS
  Filled 2016-12-30: qty 2

## 2016-12-30 MED ORDER — HYDROCODONE-ACETAMINOPHEN 5-325 MG PO TABS: 1.0000 | ORAL_TABLET | ORAL | 0 refills | Status: DC | PRN

## 2016-12-30 MED ORDER — ONDANSETRON 4 MG PO TBDP
4.0000 mg | ORAL_TABLET | Freq: Once | ORAL | Status: AC | PRN
  Administered 2016-12-30: 4 mg via ORAL
  Filled 2016-12-30: qty 1

## 2016-12-30 MED ORDER — IOPAMIDOL (ISOVUE-300) INJECTION 61%
INTRAVENOUS | Status: AC
  Filled 2016-12-30: qty 100

## 2016-12-30 MED ORDER — IOPAMIDOL (ISOVUE-300) INJECTION 61%
100.0000 mL | Freq: Once | INTRAVENOUS | Status: AC | PRN
  Administered 2016-12-30: 100 mL via INTRAVENOUS

## 2016-12-30 MED ORDER — ONDANSETRON 4 MG PO TBDP: 4.0000 mg | ORAL_TABLET | Freq: Three times a day (TID) | ORAL | 0 refills | Status: DC | PRN

## 2016-12-30 MED ORDER — MORPHINE SULFATE (PF) 4 MG/ML IV SOLN
4.0000 mg | Freq: Once | INTRAVENOUS | Status: AC
  Administered 2016-12-30: 4 mg via INTRAVENOUS
  Filled 2016-12-30: qty 1

## 2016-12-30 NOTE — ED Notes (Signed)
Family states patient has slept since being home, and screams due to pain when awake.

## 2016-12-30 NOTE — ED Notes (Signed)
Bed: DZ32 Expected date:  Expected time:  Means of arrival:  Comments: 71 yo F/bilateral leg pain

## 2016-12-30 NOTE — ED Notes (Signed)
Patient transported to CT 

## 2016-12-30 NOTE — ED Notes (Signed)
Patient seems some what lethargic, moans and asks "help me"  Patient slow to answer questions.

## 2016-12-30 NOTE — Discharge Instructions (Signed)
Please call your oncologist for an appointment for further evaluation of abnormal CT scan results. Take Norco for pain and Zofran as needed for symptoms. Return to the emergency department with any new or concerning symptoms - high fever, severe pain.

## 2016-12-30 NOTE — ED Notes (Signed)
Patient returned from CT

## 2016-12-30 NOTE — ED Triage Notes (Signed)
Patient BIB EMS from home for bilateral leg pain. Patient states her left foot hurts and feels like pins and needles. Patient was seen and discharged this am with a dx of abdominal CA. Pt has been lying in bed since, c/o leg pain. Pt has hx of motion sickness and vomited in ambulance. Pt took 4mg  po zofran at home before EMS arrived.

## 2016-12-31 ENCOUNTER — Emergency Department (HOSPITAL_COMMUNITY): Payer: Medicare HMO

## 2016-12-31 ENCOUNTER — Inpatient Hospital Stay (HOSPITAL_COMMUNITY)
Admit: 2016-12-31 | Discharge: 2016-12-31 | Disposition: A | Discharge status: Home / Self Care | Payer: Medicare HMO | Attending: Internal Medicine | Admitting: Internal Medicine

## 2016-12-31 ENCOUNTER — Inpatient Hospital Stay (HOSPITAL_COMMUNITY): Payer: Medicare HMO

## 2016-12-31 ENCOUNTER — Encounter (HOSPITAL_COMMUNITY): Payer: Self-pay | Admitting: Internal Medicine

## 2016-12-31 DIAGNOSIS — Z940 Kidney transplant status: Secondary | ICD-10-CM | POA: Diagnosis not present

## 2016-12-31 DIAGNOSIS — G9340 Encephalopathy, unspecified: Secondary | ICD-10-CM | POA: Diagnosis not present

## 2016-12-31 DIAGNOSIS — R591 Generalized enlarged lymph nodes: Secondary | ICD-10-CM | POA: Diagnosis not present

## 2016-12-31 DIAGNOSIS — K7290 Hepatic failure, unspecified without coma: Secondary | ICD-10-CM | POA: Diagnosis not present

## 2016-12-31 DIAGNOSIS — K219 Gastro-esophageal reflux disease without esophagitis: Secondary | ICD-10-CM | POA: Diagnosis present

## 2016-12-31 DIAGNOSIS — E875 Hyperkalemia: Secondary | ICD-10-CM | POA: Diagnosis not present

## 2016-12-31 DIAGNOSIS — M109 Gout, unspecified: Secondary | ICD-10-CM | POA: Diagnosis not present

## 2016-12-31 DIAGNOSIS — R4182 Altered mental status, unspecified: Secondary | ICD-10-CM | POA: Diagnosis not present

## 2016-12-31 DIAGNOSIS — Y830 Surgical operation with transplant of whole organ as the cause of abnormal reaction of the patient, or of later complication, without mention of misadventure at the time of the procedure: Secondary | ICD-10-CM | POA: Diagnosis present

## 2016-12-31 DIAGNOSIS — E785 Hyperlipidemia, unspecified: Secondary | ICD-10-CM | POA: Diagnosis not present

## 2016-12-31 DIAGNOSIS — N179 Acute kidney failure, unspecified: Secondary | ICD-10-CM | POA: Diagnosis not present

## 2016-12-31 DIAGNOSIS — T8619 Other complication of kidney transplant: Secondary | ICD-10-CM | POA: Diagnosis not present

## 2016-12-31 DIAGNOSIS — E860 Dehydration: Secondary | ICD-10-CM | POA: Diagnosis not present

## 2016-12-31 DIAGNOSIS — Z79899 Other long term (current) drug therapy: Secondary | ICD-10-CM | POA: Diagnosis not present

## 2016-12-31 DIAGNOSIS — Z7982 Long term (current) use of aspirin: Secondary | ICD-10-CM | POA: Diagnosis not present

## 2016-12-31 DIAGNOSIS — R109 Unspecified abdominal pain: Secondary | ICD-10-CM | POA: Diagnosis not present

## 2016-12-31 DIAGNOSIS — C8590 Non-Hodgkin lymphoma, unspecified, unspecified site: Secondary | ICD-10-CM | POA: Diagnosis not present

## 2016-12-31 DIAGNOSIS — Z8572 Personal history of non-Hodgkin lymphomas: Secondary | ICD-10-CM | POA: Diagnosis not present

## 2016-12-31 DIAGNOSIS — Z85828 Personal history of other malignant neoplasm of skin: Secondary | ICD-10-CM | POA: Diagnosis not present

## 2016-12-31 DIAGNOSIS — S99922A Unspecified injury of left foot, initial encounter: Secondary | ICD-10-CM | POA: Diagnosis not present

## 2016-12-31 DIAGNOSIS — R7989 Other specified abnormal findings of blood chemistry: Secondary | ICD-10-CM | POA: Diagnosis not present

## 2016-12-31 DIAGNOSIS — I10 Essential (primary) hypertension: Secondary | ICD-10-CM | POA: Diagnosis not present

## 2016-12-31 LAB — CBC WITH DIFFERENTIAL/PLATELET
BASOS PCT: 0 %
Basophils Absolute: 0 10*3/uL (ref 0.0–0.1)
Basophils Absolute: 0 10*3/uL (ref 0.0–0.1)
Basophils Relative: 0 %
EOS PCT: 0 %
Eosinophils Absolute: 0 10*3/uL (ref 0.0–0.7)
Eosinophils Absolute: 0 10*3/uL (ref 0.0–0.7)
Eosinophils Relative: 0 %
HEMATOCRIT: 37.3 % (ref 36.0–46.0)
HEMATOCRIT: 43.4 % (ref 36.0–46.0)
Hemoglobin: 12.4 g/dL (ref 12.0–15.0)
Hemoglobin: 13.8 g/dL (ref 12.0–15.0)
LYMPHS ABS: 1.5 10*3/uL (ref 0.7–4.0)
LYMPHS PCT: 18 %
Lymphocytes Relative: 18 %
Lymphs Abs: 1.6 10*3/uL (ref 0.7–4.0)
MCH: 30.7 pg (ref 26.0–34.0)
MCH: 31.5 pg (ref 26.0–34.0)
MCHC: 31.8 g/dL (ref 30.0–36.0)
MCHC: 33.2 g/dL (ref 30.0–36.0)
MCV: 94.7 fL (ref 78.0–100.0)
MCV: 96.7 fL (ref 78.0–100.0)
MONO ABS: 1.5 10*3/uL — AB (ref 0.1–1.0)
MONOS PCT: 15 %
Monocytes Absolute: 1.3 10*3/uL — ABNORMAL HIGH (ref 0.1–1.0)
Monocytes Relative: 18 %
NEUTROS ABS: 5.8 10*3/uL (ref 1.7–7.7)
Neutro Abs: 5.4 10*3/uL (ref 1.7–7.7)
Neutrophils Relative %: 64 %
Neutrophils Relative %: 67 %
PLATELETS: 186 10*3/uL (ref 150–400)
Platelets: 205 10*3/uL (ref 150–400)
RBC: 3.94 MIL/uL (ref 3.87–5.11)
RBC: 4.49 MIL/uL (ref 3.87–5.11)
RDW: 14.3 % (ref 11.5–15.5)
RDW: 14.4 % (ref 11.5–15.5)
WBC: 8.3 10*3/uL (ref 4.0–10.5)
WBC: 8.7 10*3/uL (ref 4.0–10.5)

## 2016-12-31 LAB — BASIC METABOLIC PANEL
Anion gap: 6 (ref 5–15)
BUN: 21 mg/dL — AB (ref 6–20)
CO2: 25 mmol/L (ref 22–32)
Calcium: 8.3 mg/dL — ABNORMAL LOW (ref 8.9–10.3)
Chloride: 109 mmol/L (ref 101–111)
Creatinine, Ser: 1.56 mg/dL — ABNORMAL HIGH (ref 0.44–1.00)
GFR calc Af Amer: 38 mL/min — ABNORMAL LOW (ref >60)
GFR, EST NON AFRICAN AMERICAN: 33 mL/min — AB (ref >60)
GLUCOSE: 125 mg/dL — AB (ref 65–99)
Potassium: 5 mmol/L (ref 3.5–5.1)
Sodium: 140 mmol/L (ref 135–145)

## 2016-12-31 LAB — URINALYSIS, ROUTINE W REFLEX MICROSCOPIC
BILIRUBIN URINE: NEGATIVE
GLUCOSE, UA: NEGATIVE mg/dL
Hgb urine dipstick: NEGATIVE
KETONES UR: NEGATIVE mg/dL
Leukocytes, UA: NEGATIVE
Nitrite: NEGATIVE
PH: 5 (ref 5.0–8.0)
Protein, ur: NEGATIVE mg/dL
Specific Gravity, Urine: 1.02 (ref 1.005–1.030)

## 2016-12-31 LAB — GLUCOSE, CAPILLARY: Glucose-Capillary: 141 mg/dL — ABNORMAL HIGH (ref 65–99)

## 2016-12-31 LAB — COMPREHENSIVE METABOLIC PANEL
ALBUMIN: 4 g/dL (ref 3.5–5.0)
ALT: 24 U/L (ref 14–54)
AST: 31 U/L (ref 15–41)
Alkaline Phosphatase: 84 U/L (ref 38–126)
Anion gap: 7 (ref 5–15)
BILIRUBIN TOTAL: 0.4 mg/dL (ref 0.3–1.2)
BUN: 21 mg/dL — ABNORMAL HIGH (ref 6–20)
CO2: 24 mmol/L (ref 22–32)
Calcium: 8.6 mg/dL — ABNORMAL LOW (ref 8.9–10.3)
Chloride: 107 mmol/L (ref 101–111)
Creatinine, Ser: 1.94 mg/dL — ABNORMAL HIGH (ref 0.44–1.00)
GFR, EST AFRICAN AMERICAN: 29 mL/min — AB (ref >60)
GFR, EST NON AFRICAN AMERICAN: 25 mL/min — AB (ref >60)
GLUCOSE: 147 mg/dL — AB (ref 65–99)
POTASSIUM: 5.5 mmol/L — AB (ref 3.5–5.1)
Sodium: 138 mmol/L (ref 135–145)
TOTAL PROTEIN: 7.3 g/dL (ref 6.5–8.1)

## 2016-12-31 LAB — HEPATIC FUNCTION PANEL
ALBUMIN: 3.6 g/dL (ref 3.5–5.0)
ALT: 20 U/L (ref 14–54)
AST: 30 U/L (ref 15–41)
Alkaline Phosphatase: 72 U/L (ref 38–126)
Bilirubin, Direct: 0.2 mg/dL (ref 0.1–0.5)
Indirect Bilirubin: 0.5 mg/dL (ref 0.3–0.9)
TOTAL PROTEIN: 6.2 g/dL — AB (ref 6.5–8.1)
Total Bilirubin: 0.7 mg/dL (ref 0.3–1.2)

## 2016-12-31 LAB — BLOOD GAS, ARTERIAL
Acid-base deficit: 4.9 mmol/L — ABNORMAL HIGH (ref 0.0–2.0)
Bicarbonate: 21.7 mmol/L (ref 20.0–28.0)
Drawn by: 232811
O2 Content: 2 L/min
O2 Saturation: 93.2 %
PCO2 ART: 49.3 mmHg — AB (ref 32.0–48.0)
PH ART: 7.268 — AB (ref 7.350–7.450)
Patient temperature: 98.9
pO2, Arterial: 78.6 mmHg — ABNORMAL LOW (ref 83.0–108.0)

## 2016-12-31 LAB — TSH: TSH: 0.878 u[IU]/mL (ref 0.350–4.500)

## 2016-12-31 LAB — CK: Total CK: 176 U/L (ref 38–234)

## 2016-12-31 LAB — LIPASE, BLOOD: Lipase: 15 U/L (ref 11–51)

## 2016-12-31 LAB — I-STAT CG4 LACTIC ACID, ED: LACTIC ACID, VENOUS: 2.58 mmol/L — AB (ref 0.5–1.9)

## 2016-12-31 LAB — PROTIME-INR
INR: 1.12
Prothrombin Time: 14.5 seconds (ref 11.4–15.2)

## 2016-12-31 LAB — TROPONIN I
Troponin I: 0.28 ng/mL (ref <0.03)
Troponin I: 0.2 ng/mL (ref <0.03)
Troponin I: 0.17 ng/mL (ref <0.03)

## 2016-12-31 LAB — CBG MONITORING, ED: GLUCOSE-CAPILLARY: 136 mg/dL — AB (ref 65–99)

## 2016-12-31 LAB — AMMONIA: Ammonia: 13 umol/L (ref 9–35)

## 2016-12-31 LAB — PHOSPHORUS: Phosphorus: 2.7 mg/dL (ref 2.5–4.6)

## 2016-12-31 LAB — I-STAT TROPONIN, ED: TROPONIN I, POC: 0.34 ng/mL — AB (ref 0.00–0.08)

## 2016-12-31 LAB — URIC ACID: Uric Acid, Serum: 8.6 mg/dL — ABNORMAL HIGH (ref 2.3–6.6)

## 2016-12-31 MED ORDER — METOCLOPRAMIDE HCL 5 MG/ML IJ SOLN
INTRAMUSCULAR | Status: AC
  Filled 2016-12-31: qty 2

## 2016-12-31 MED ORDER — SODIUM CHLORIDE 0.9 % IV SOLN
INTRAVENOUS | Status: DC
  Administered 2016-12-31 (×2): via INTRAVENOUS

## 2016-12-31 MED ORDER — ACETAMINOPHEN 650 MG RE SUPP: 650.0000 mg | Freq: Four times a day (QID) | RECTAL | Status: DC | PRN

## 2016-12-31 MED ORDER — GI COCKTAIL ~~LOC~~
30.0000 mL | Freq: Three times a day (TID) | ORAL | Status: DC
  Administered 2016-12-31 – 2017-01-03 (×3): 30 mL via ORAL
  Filled 2016-12-31 (×6): qty 30

## 2016-12-31 MED ORDER — METOCLOPRAMIDE HCL 5 MG/ML IJ SOLN
5.0000 mg | Freq: Once | INTRAMUSCULAR | Status: AC
  Administered 2016-12-31: 5 mg via INTRAVENOUS
  Filled 2016-12-31: qty 2

## 2016-12-31 MED ORDER — ONDANSETRON HCL 4 MG PO TABS: 4.0000 mg | ORAL_TABLET | Freq: Four times a day (QID) | ORAL | Status: DC | PRN

## 2016-12-31 MED ORDER — PROMETHAZINE HCL 25 MG/ML IJ SOLN
12.5000 mg | Freq: Four times a day (QID) | INTRAMUSCULAR | Status: DC | PRN
  Administered 2016-12-31 (×2): 12.5 mg via INTRAVENOUS
  Filled 2016-12-31 (×2): qty 1

## 2016-12-31 MED ORDER — ONDANSETRON HCL 4 MG/2ML IJ SOLN
4.0000 mg | Freq: Four times a day (QID) | INTRAMUSCULAR | Status: DC | PRN
  Filled 2016-12-31: qty 2

## 2016-12-31 MED ORDER — HYDROCORTISONE NA SUCCINATE PF 100 MG IJ SOLR
50.0000 mg | Freq: Four times a day (QID) | INTRAMUSCULAR | Status: DC
  Administered 2016-12-31 (×2): 50 mg via INTRAVENOUS
  Filled 2016-12-31 (×2): qty 2

## 2016-12-31 MED ORDER — ACETAMINOPHEN 325 MG PO TABS
650.0000 mg | ORAL_TABLET | Freq: Four times a day (QID) | ORAL | Status: DC | PRN
  Administered 2017-01-01 – 2017-01-03 (×2): 650 mg via ORAL
  Filled 2016-12-31 (×2): qty 2

## 2016-12-31 MED ORDER — METRONIDAZOLE IN NACL 5-0.79 MG/ML-% IV SOLN
500.0000 mg | Freq: Three times a day (TID) | INTRAVENOUS | Status: DC
  Administered 2016-12-31 – 2017-01-03 (×8): 500 mg via INTRAVENOUS
  Filled 2016-12-31 (×9): qty 100

## 2016-12-31 MED ORDER — ONDANSETRON HCL 4 MG PO TABS: 4.0000 mg | ORAL_TABLET | Freq: Four times a day (QID) | ORAL | Status: DC

## 2016-12-31 MED ORDER — VANCOMYCIN HCL IN DEXTROSE 1-5 GM/200ML-% IV SOLN
1000.0000 mg | Freq: Once | INTRAVENOUS | Status: AC
  Administered 2016-12-31: 1000 mg via INTRAVENOUS
  Filled 2016-12-31: qty 200

## 2016-12-31 MED ORDER — DEXTROSE 5 % IV SOLN
2.0000 g | INTRAVENOUS | Status: DC
  Administered 2016-12-31: 2 g via INTRAVENOUS
  Filled 2016-12-31 (×2): qty 2

## 2016-12-31 MED ORDER — ONDANSETRON HCL 4 MG/2ML IJ SOLN
4.0000 mg | Freq: Once | INTRAMUSCULAR | Status: AC
  Administered 2016-12-31: 4 mg via INTRAVENOUS

## 2016-12-31 MED ORDER — MORPHINE SULFATE (PF) 4 MG/ML IV SOLN
2.0000 mg | INTRAVENOUS | Status: DC | PRN
  Administered 2016-12-31: 2 mg via INTRAVENOUS

## 2016-12-31 MED ORDER — FENTANYL CITRATE (PF) 100 MCG/2ML IJ SOLN
50.0000 ug | Freq: Once | INTRAMUSCULAR | Status: AC
  Administered 2016-12-31: 50 ug via INTRAVENOUS
  Filled 2016-12-31: qty 2

## 2016-12-31 MED ORDER — VANCOMYCIN HCL 10 G IV SOLR: 1250.0000 mg | INTRAVENOUS | Status: DC

## 2016-12-31 MED ORDER — ONDANSETRON HCL 4 MG/2ML IJ SOLN
4.0000 mg | Freq: Once | INTRAMUSCULAR | Status: AC
  Administered 2016-12-31: 4 mg via INTRAVENOUS
  Filled 2016-12-31: qty 2

## 2016-12-31 MED ORDER — PROMETHAZINE HCL 25 MG/ML IJ SOLN
12.5000 mg | Freq: Once | INTRAMUSCULAR | Status: AC
  Administered 2016-12-31: 12.5 mg via INTRAVENOUS
  Filled 2016-12-31: qty 1

## 2016-12-31 MED ORDER — METOCLOPRAMIDE HCL 5 MG/ML IJ SOLN
5.0000 mg | Freq: Four times a day (QID) | INTRAMUSCULAR | Status: DC
  Administered 2016-12-31 – 2017-01-03 (×10): 5 mg via INTRAVENOUS
  Filled 2016-12-31 (×10): qty 2

## 2016-12-31 MED ORDER — MORPHINE SULFATE (PF) 4 MG/ML IV SOLN
INTRAVENOUS | Status: AC
  Filled 2016-12-31: qty 1

## 2016-12-31 MED ORDER — GI COCKTAIL ~~LOC~~
ORAL | Status: AC
  Filled 2016-12-31: qty 30

## 2016-12-31 MED ORDER — VANCOMYCIN HCL IN DEXTROSE 1-5 GM/200ML-% IV SOLN
1000.0000 mg | INTRAVENOUS | Status: AC
  Administered 2016-12-31: 1000 mg via INTRAVENOUS
  Filled 2016-12-31: qty 200

## 2016-12-31 MED ORDER — MORPHINE SULFATE (PF) 10 MG/ML IV SOLN
2.0000 mg | INTRAVENOUS | Status: DC | PRN
  Administered 2016-12-31: 2 mg via INTRAVENOUS
  Filled 2016-12-31 (×2): qty 1

## 2016-12-31 MED ORDER — SODIUM CHLORIDE 0.9 % IV BOLUS (SEPSIS)
1000.0000 mL | Freq: Once | INTRAVENOUS | Status: AC
  Administered 2016-12-31: 1000 mL via INTRAVENOUS

## 2016-12-31 MED ORDER — HYDROCORTISONE NA SUCCINATE PF 100 MG IJ SOLR
50.0000 mg | Freq: Three times a day (TID) | INTRAMUSCULAR | Status: DC
  Administered 2016-12-31 – 2017-01-02 (×6): 50 mg via INTRAVENOUS
  Filled 2016-12-31 (×6): qty 2

## 2016-12-31 MED ORDER — DEXTROSE 5 % IV SOLN
2.0000 g | INTRAVENOUS | Status: AC
  Administered 2016-12-31: 2 g via INTRAVENOUS
  Filled 2016-12-31: qty 2

## 2016-12-31 MED ORDER — ONDANSETRON HCL 4 MG/2ML IJ SOLN
4.0000 mg | Freq: Four times a day (QID) | INTRAMUSCULAR | Status: DC
  Administered 2016-12-31 – 2017-01-03 (×8): 4 mg via INTRAVENOUS
  Filled 2016-12-31 (×8): qty 2

## 2016-12-31 MED ORDER — TACROLIMUS 0.5 MG PO CAPS
1.5000 mg | ORAL_CAPSULE | Freq: Two times a day (BID) | ORAL | Status: DC
  Administered 2017-01-01 – 2017-01-03 (×5): 1.5 mg via ORAL
  Filled 2016-12-31 (×7): qty 1

## 2016-12-31 NOTE — ED Provider Notes (Signed)
[facility] DEPT Provider Note   CSN: [id number] Arrival date & time: 12/30/16  2146     History   Chief Complaint Chief Complaint  Patient presents with  . Leg Pain  . Emesis    LEVEL 5 CAVEAT SECONDARY TO ACUITY OF CONDITION   HPI Erica Woodward is a 71 y.o. female.  71 year old female with complex medical hx including GERD, HLD, HTN, kidney transplant (2005), non-Hodgkin's lymphoma (in remission, last chemo 1540), and SBO (complication of tumor) presents for altered mental status. Patient was seen and evaluated yesterday for abdominal pain. She had a CT scan which showed findings suggestive of her current lymphoma. Family reports that patient is scheduled to see her oncologist yesterday. Since arriving home, however, patient has become more somnolent. She is usually able to ambulate without assistance. She has required the use of multi person assist for transitioning. Patient also complaining of left foot pain. She describes the discomfort as a feeling of "pins and needles". During my assessment, patient does complain of headache. She has been nauseous with vomiting prior to arrival. Patient noted to have soiled her underwear on ED arrival. It is unclear whether this was an episode of true bowel incontinence. No hx of urinary incontinence. No fevers or falls since discharge. Family denies concern for polypharmacy. History limited secondary to acuity of condition; patient too lethargic to contribute to history.      Past Medical History:  Diagnosis Date  . Basal cell carcinoma   . Cancer [facility])    carcinomatosis  . Chronic headaches   . Diverticulosis   . GERD (gastroesophageal reflux disease)   . HLD (hyperlipidemia)   . Hx of adenomatous polyp of colon 05/21/2016  . Hypertension   . Renal disorder    kidney transplant 2005  . Status post dilation of esophageal narrowing     Patient Active Problem List   Diagnosis Date Noted  . Hx of adenomatous polyp of colon  05/21/2016  . NHL (non-Hodgkin's lymphoma) ([place]) 08/02/2014  . GERD (gastroesophageal reflux disease) 06/16/2014  . Carcinomatosis ([place])   . Abdominal pain 06/15/2014  . KIDNEY TRANSPLANTATION, HX OF 07/21/2010  . HLD (hyperlipidemia) 01/17/2010  . GOUT 01/17/2010  . ACUTE SEROUS OTITIS MEDIA 01/17/2010  . OTITIS MEDIA, ACUTE WITH RUPTURE OF TYMPANIC MEMBRANE 01/17/2010  . Essential hypertension 01/17/2010  . KNEE INJURY, RIGHT 01/17/2010    Past Surgical History:  Procedure Laterality Date  . COLONOSCOPY    . KIDNEY TRANSPLANT  07/26/2013  . LUMBAR PUNCTURE W/ INTRATHECAL CHEMOTHERAPY  08/19/14  . PORTACATH PLACEMENT    . UPPER GASTROINTESTINAL ENDOSCOPY      OB History    No data available       Home Medications    Prior to Admission medications   Medication Sig Start Date End Date Taking? Authorizing Provider  aspirin EC 81 MG tablet Take 81 mg by mouth daily.   Yes [provider]  atorvastatin (LIPITOR) 40 MG tablet TAKE 1 TABLET (40 MG TOTAL) BY MOUTH DAILY. 09/25/16  Yes Susy Frizzle, MD  cetirizine (ZYRTEC) 10 MG tablet Take 10 mg by mouth at bedtime.   Yes [provider]  [facility] SENNA PLUS 8.6-50 MG per tablet Take 1 tablet by mouth daily.  07/07/14  Yes [provider]  esomeprazole (NEXIUM) 40 MG capsule Take 1 capsule (40 mg total) by mouth daily. 04/18/16  Yes Susy Frizzle, MD  gabapentin (NEURONTIN) 300 MG capsule Take 1 capsule (300  mg total) by mouth 3 (three) times daily. Patient taking differently: Take 900 mg by mouth 2 (two) times daily.  10/03/16  Yes Susy Frizzle, MD  HYDROcodone-acetaminophen (NORCO/VICODIN) 5-325 MG tablet Take 1-2 tablets by mouth every 4 (four) hours as needed. 12/30/16  Yes Upstill, Nehemiah Settle, PA-C  magnesium oxide (MAG-OX) 400 MG tablet Take 400 mg by mouth 2 (two) times daily.    Yes [provider]  ondansetron (ZOFRAN ODT) 4 MG disintegrating tablet Take 1 tablet (4 mg total) by mouth  every 8 (eight) hours as needed for nausea or vomiting. 12/30/16  Yes Upstill, Nehemiah Settle, PA-C  predniSONE (DELTASONE) 10 MG tablet Take 10 mg by mouth daily with breakfast.   Yes [provider]  tacrolimus (PROGRAF) 0.5 MG capsule Take 1.5 mg by mouth 2 (two) times daily. 1 CAPSULE IN THE MORNING AND 2 CAPSULES AT NIGHT. 06/28/14  Yes [provider]    Family History Family History  Problem Relation Age of Onset  . Kidney disease Father   . Diabetes Father   . Lung cancer Brother   . Seizures Brother   . Seizures Sister   . Breast cancer Daughter   . Heart disease Brother     Social History Social History  Substance Use Topics  . Smoking status: Never Smoker  . Smokeless tobacco: Never Used  . Alcohol use No     Allergies   Patient has no known allergies.   Review of Systems Review of Systems  Unable to perform ROS: Acuity of condition     Physical Exam Updated Vital Signs BP 125/65 (BP Location: Right Arm)   Pulse 93   Temp 98.2 F (36.8 C) (Oral)   Resp 16   Ht 5\' 4"  (1.626 m)   SpO2 98%   Physical Exam  Constitutional: She is oriented to person, place, and time. She appears well-developed and well-nourished.  Patient appears fatigued and unwell.  HENT:  Head: Normocephalic and atraumatic.  Mildly dry mm. Symmetric rise of the uvula with phonation.  Eyes: Conjunctivae and EOM are normal. Pupils are equal, round, and reactive to light. No scleral icterus.  51mm reactive bilaterally.  Neck: Normal range of motion.  Cardiovascular: Normal rate, regular rhythm and intact distal pulses.   DP and PT pulse 2+ in BLE  Pulmonary/Chest: Effort normal. No respiratory distress. She has no wheezes.  Lungs grossly CTAB.  Abdominal: Soft. There is no tenderness. There is no guarding.  Obese. No focal tenderness, distension, or peritoneal signs.  Musculoskeletal: Normal range of motion.  Neurological: She is alert and oriented to person, place, and time.  Coordination normal.  GCS 15. Patient follows some commands. Exam limited 2/2 poor effort, though no focal deficits noted.  Skin: Skin is warm and dry. No rash noted. There is erythema. No pallor.  Erythematous area to plantar aspect of L heel ~4.5cm diameter. Mild TTP. Warmth present. No red linear streaking. Blanching.  Psychiatric: She has a normal mood and affect. Her behavior is normal.  Nursing note and vitals reviewed.    ED Treatments / Results  Labs (all labs ordered are listed, but only abnormal results are displayed) Labs Reviewed  CBC WITH DIFFERENTIAL/PLATELET - Abnormal; Notable for the following:       Result Value   Monocytes Absolute 1.3 (*)    All other components within normal limits  COMPREHENSIVE METABOLIC PANEL - Abnormal; Notable for the following:    Potassium 5.5 (*)    Glucose,  Bld 147 (*)    BUN 21 (*)    Creatinine, Ser 1.94 (*)    Calcium 8.6 (*)    GFR calc non Af Amer 25 (*)    GFR calc Af Amer 29 (*)    All other components within normal limits  URINALYSIS, ROUTINE W REFLEX MICROSCOPIC - Abnormal; Notable for the following:    APPearance CLOUDY (*)    All other components within normal limits  BLOOD GAS, ARTERIAL - Abnormal; Notable for the following:    pH, Arterial 7.268 (*)    pCO2 arterial 49.3 (*)    pO2, Arterial 78.6 (*)    Acid-base deficit 4.9 (*)    All other components within normal limits  I-STAT CG4 LACTIC ACID, ED - Abnormal; Notable for the following:    Lactic Acid, Venous 2.58 (*)    All other components within normal limits  I-STAT TROPOININ, ED - Abnormal; Notable for the following:    Troponin i, poc 0.34 (*)    All other components within normal limits  CULTURE, BLOOD (ROUTINE X 2)  CULTURE, BLOOD (ROUTINE X 2)  PROTIME-INR  LIPASE, BLOOD  CK  PHOSPHORUS    EKG  EKG Interpretation  Date/Time:  Monday December 31 2016 00:32:13 EDT Ventricular Rate:  95 PR Interval:    QRS Duration: 88 QT Interval:  348 QTC  Calculation: 438 R Axis:   81 Text Interpretation:  Sinus rhythm Borderline right axis deviation Low voltage, precordial leads No acute changes No significant change since last tracing Confirmed by Varney Biles ([id number]) on 12/31/2016 1:07:27 AM       Radiology Ct Head Wo Contrast  Result Date: 12/31/2016 CLINICAL DATA:  Altered mental status.  Lethargy. EXAM: CT HEAD WITHOUT CONTRAST TECHNIQUE: Contiguous axial images were obtained from the base of the skull through the vertex without intravenous contrast. COMPARISON:  Face CT 07/31/2008 FINDINGS: Brain: No evidence of acute infarction, hemorrhage, hydrocephalus, extra-axial collection or mass lesion/mass effect. Vascular: Atherosclerosis of skullbase vasculature without hyperdense vessel or abnormal calcification. Skull: No fracture.  No focal lesion. Sinuses/Orbits: Mastoid air cells are underpneumatized, chronic. Improved paranasal sinus inflammation from prior exam, minimal residual mucosal thickening of posterior ethmoid air cells. Visualized orbits are unremarkable. Other: None. IMPRESSION: No acute intracranial abnormality. Electronically Signed   By: Jeb Levering M.D.   On: 12/31/2016 01:16   Ct Abdomen Pelvis W Contrast  Result Date: 12/30/2016 CLINICAL DATA:  Left lower quadrant abdominal pain radiating to the back EXAM: CT ABDOMEN AND PELVIS WITH CONTRAST TECHNIQUE: Multidetector CT imaging of the abdomen and pelvis was performed using the standard protocol following bolus administration of intravenous contrast. CONTRAST:  100 mL Isovue 300 intravenous COMPARISON:  Radiograph 06/29/2014, CT 06/28/2014 FINDINGS: Lower chest: Lung bases demonstrate no acute consolidation or pleural effusion. Mild coronary artery calcification. Small distal esophageal hiatal hernia. Hepatobiliary: Stable cyst in the left hepatic lobe. No calcified gallstones. No biliary dilatation. Pancreas: Unremarkable. No pancreatic ductal dilatation or surrounding  inflammatory changes. Spleen: Normal in size without focal abnormality. Adrenals/Urinary Tract: Adrenal glands are within normal limits. Atrophic native kidneys with no hydronephrosis. Unremarkable appearing right lower quadrant renal transplant. Bladder normal Stomach/Bowel: The stomach is nonenlarged. No dilated small bowel. No colon wall thickening. Vascular/Lymphatic: Aortic atherosclerosis. No aneurysmal dilatation. Multiple enlarged retroperitoneal lymph nodes. These measure up to 1.7 cm in diameter. Small nodules are nodes anterior to the aortic bifurcation. Lymph nodes adjacent to the left common iliac vessel. Left pelvic sidewall adenopathy measuring  up to 1.7 cm. Presacral nodule measuring 1.8 cm with adjacent smaller presacral nodules noted. Reproductive: Uterus unremarkable.  3 cm right ovarian cyst Other: No free air or free fluid Musculoskeletal: Grade 1 anterolisthesis of L4 on L5. No acute or suspicious bone lesion IMPRESSION: 1. No acute abnormality is visualized 2. Interim development of retroperitoneal, iliac chain, and left pelvic sidewall adenopathy. Multiple soft tissue nodules in the presacral space. Findings are concerning for neoplasm, possible metastatic disease or lymphoma. Correlation with PET-CT is suggested. 3. 3 cm right ovarian cyst. Electronically Signed   By: Donavan Foil M.D.   On: 12/30/2016 03:13   Dg Chest Port 1 View  Result Date: 12/31/2016 CLINICAL DATA:  14-year-old female with altered mental status. EXAM: PORTABLE CHEST 1 VIEW COMPARISON:  Chest CT dated 06/16/2014 FINDINGS: Minimal bibasilar atelectatic changes. No focal consolidation, pleural effusion, or pneumothorax. Top-normal cardiac size. Mild atherosclerotic calcification of the aortic arch. No acute osseous pathology. IMPRESSION: No active disease. Electronically Signed   By: Anner Crete M.D.   On: 12/31/2016 00:20    Procedures Procedures (including critical care time)  Medications Ordered in  ED Medications  vancomycin (VANCOCIN) IVPB 1000 mg/200 mL premix (1,000 mg Intravenous New Bag/Given 12/31/16 0256)  ondansetron (ZOFRAN-ODT) disintegrating tablet 4 mg (4 mg Oral Given 12/30/16 2303)  sodium chloride 0.9 % bolus 1,000 mL (0 mLs Intravenous Stopped 12/31/16 0120)  ondansetron (ZOFRAN) injection 4 mg (4 mg Intravenous Given 12/31/16 0033)  fentaNYL (SUBLIMAZE) injection 50 mcg (50 mcg Intravenous Given 12/31/16 0110)  ceFEPIme (MAXIPIME) 2 g in dextrose 5 % 50 mL IVPB (0 g Intravenous Stopped 12/31/16 0258)  promethazine (PHENERGAN) injection 12.5 mg (12.5 mg Intravenous Given 12/31/16 0153)     Carlean Jews heel erythema; ~4.5cm   Initial Impression / Assessment and Plan / ED Course  I have reviewed the triage vital signs and the nursing notes.  Pertinent labs & imaging results that were available during my care of the patient were reviewed by me and considered in my medical decision making (see chart for details).     71 year old female presents to the emergency department for altered mental status and left foot pain. She was seen yesterday for abdominal pain with a CT scan showing likely recurrence of her lymphoma. His discharge, the family states that patient has become much more lethargic and slow to respond. No fevers prior to arrival or while in the emergency department.  Workup today reveals new ARF with doubling of creatinine and mild hyperkalemia. Troponin also elevated at 0.34. No EKG changes to suggest STEMI; no changes c/w hyperK. Lactate elevated at 2.58.  Causes symptoms today are of unclear etiology. Patient does have an area of redness to her left foot which may possibly be consistent with infection; however, patient does not meet sepsis criteria. She has no leukocytosis, fever, tachycardia, or hypotension. Broad-spectrum antibiotics were initiated given history of immunosuppression.  CT head obtained to evaluate altered mental status. This shows no evidence of acute  hemorrhage or infarct. If patient does not return to baseline with supportive measures, MRI may be indicated. I do not believe this is necessary to complete on an emergent basis as patient is out of stroke window for intervention. Chest x-ray also obtained which shows no evidence of pneumonia or other acute cardiopulmonary abnormality.  Patient continuing to receive IV hydration. Will admit for further workup and management. Case discussed with Dr. Hal Hope of [facility] who will admit.   Final Clinical Impressions(s) / ED  Diagnoses   Final diagnoses:  Acute renal failure, unspecified acute renal failure type (HCC)  Hyperkalemia  Elevated troponin  Altered mental status, unspecified altered mental status type  Adenopathy  Hx of non-Hodgkin's lymphoma    New Prescriptions New Prescriptions   No medications on file     Antonietta Breach, PA-C 12/31/16 [place], [place], MD 01/01/17 2309

## 2016-12-31 NOTE — Progress Notes (Signed)
Pt c/o increased nausea, V.O received to give Reglan  iv 5 mg

## 2016-12-31 NOTE — Progress Notes (Signed)
Abg attempted x2 but unsuccessful.  Rx discontinued per MD.

## 2016-12-31 NOTE — ED Notes (Signed)
Second set of blood cultures obtained in left wrist

## 2016-12-31 NOTE — H&P (Signed)
History and Physical    Erica Woodward DXI:[id number] DOB: [birth year] DOA: 12/30/2016  PCP: Susy Frizzle, MD  Patient coming from: Home.  History obtained from patient's daughter. Patient is encephalopathic.  Chief Complaint: Confusion.  HPI: Erica Woodward is a 71 y.o. female with history of high-grade B-cell lymphoma treated in 2016 at [facility], kidney transplant on immunosuppressants, hyperlipidemia was brought to the ER patient was found to be confused and lethargic. Patient was brought to the ER 2 days ago for abdominal pain and at that time CT of the abdomen and pelvis done showed possible recurrence of patient's lymphoma. Patient was discharged home after receiving pain medications. As per the family patient went home yesterday morning and sleeping through the day. Later in the evening and patient workup patient was confused and lethargic. Patient had also multiple episodes of vomiting. Patient had only taken 1 dose of hydrocortisone given to her.   ED Course: Labs in the ER shows acute renal failure. CT head was unremarkable. Patient is afebrile. Patient is oriented to his name but does not follow commands but moves all extremities. Pupils are reacting to light. ABG shows pH of 7.26 with PCO2 of 49.3. Patient's left heel shows erythema and was complaining of pain. Patient was empirically started on antibiotics for possible cellulitis and IV fluids for acute renal failure. Patient admitted for further management of acute encephalopathy.  Review of Systems: As per HPI, rest all negative.   Past Medical History:  Diagnosis Date  . Basal cell carcinoma   . Cancer [facility])    carcinomatosis  . Chronic headaches   . Diverticulosis   . GERD (gastroesophageal reflux disease)   . HLD (hyperlipidemia)   . Hx of adenomatous polyp of colon 05/21/2016  . Hypertension   . Renal disorder    kidney transplant 2005  . Status post dilation of esophageal narrowing     Past Surgical  History:  Procedure Laterality Date  . COLONOSCOPY    . KIDNEY TRANSPLANT  07/26/2013  . LUMBAR PUNCTURE W/ INTRATHECAL CHEMOTHERAPY  08/19/14  . PORTACATH PLACEMENT    . UPPER GASTROINTESTINAL ENDOSCOPY       reports that she has never smoked. She has never used smokeless tobacco. She reports that she does not drink alcohol or use drugs.  No Known Allergies  Family History  Problem Relation Age of Onset  . Kidney disease Father   . Diabetes Father   . Lung cancer Brother   . Seizures Brother   . Seizures Sister   . Breast cancer Daughter   . Heart disease Brother     Prior to Admission medications   Medication Sig Start Date End Date Taking? Authorizing Provider  aspirin EC 81 MG tablet Take 81 mg by mouth daily.   Yes [provider]  atorvastatin (LIPITOR) 40 MG tablet TAKE 1 TABLET (40 MG TOTAL) BY MOUTH DAILY. 09/25/16  Yes Susy Frizzle, MD  cetirizine (ZYRTEC) 10 MG tablet Take 10 mg by mouth at bedtime.   Yes [provider]  [facility] SENNA PLUS 8.6-50 MG per tablet Take 1 tablet by mouth daily.  07/07/14  Yes [provider]  esomeprazole (NEXIUM) 40 MG capsule Take 1 capsule (40 mg total) by mouth daily. 04/18/16  Yes Susy Frizzle, MD  gabapentin (NEURONTIN) 300 MG capsule Take 1 capsule (300 mg total) by mouth 3 (three) times daily. Patient taking differently: Take 900 mg by mouth 2 (two) times daily.  10/03/16  Yes Susy Frizzle, MD  HYDROcodone-acetaminophen (NORCO/VICODIN) 5-325 MG tablet Take 1-2 tablets by mouth every 4 (four) hours as needed. 12/30/16  Yes Upstill, Nehemiah Settle, PA-C  magnesium oxide (MAG-OX) 400 MG tablet Take 400 mg by mouth 2 (two) times daily.    Yes [provider]  ondansetron (ZOFRAN ODT) 4 MG disintegrating tablet Take 1 tablet (4 mg total) by mouth every 8 (eight) hours as needed for nausea or vomiting. 12/30/16  Yes Upstill, Nehemiah Settle, PA-C  predniSONE (DELTASONE) 10 MG tablet Take 10 mg by mouth daily with  breakfast.   Yes [provider]  tacrolimus (PROGRAF) 0.5 MG capsule Take 1.5 mg by mouth 2 (two) times daily. 1 CAPSULE IN THE MORNING AND 2 CAPSULES AT NIGHT. 06/28/14  Yes [provider]    Physical Exam: Vitals:   12/30/16 2230 12/31/16 0038 12/31/16 0100 12/31/16 0304  BP: 131/79 133/79 139/67 125/65  Pulse: 93 93 91 93  Resp:  19 18 16   Temp:      TempSrc:      SpO2: 90% 94% 97% 98%  Height:          Constitutional: Moderately built and nourished. Vitals:   12/30/16 2230 12/31/16 0038 12/31/16 0100 12/31/16 0304  BP: 131/79 133/79 139/67 125/65  Pulse: 93 93 91 93  Resp:  19 18 16   Temp:      TempSrc:      SpO2: 90% 94% 97% 98%  Height:       Eyes: Anicteric. No pallor. ENMT: No discharge from the ears eyes nose and mouth. Neck: No mass felt. No neck rigidity. Respiratory: No rhonchi or crepitations. Cardiovascular: S1 and S2. No murmurs appreciated. Abdomen: Soft nontender bowel sounds present. Musculoskeletal: Left heel erythema and tenderness. Skin: Erythema on the left heel. Neurologic: Patient is confused and oriented to name. Moves all extremities. Pupils are reacting. Psychiatric: Patient appears confused.   Labs on Admission: I have personally reviewed following labs and imaging studies  CBC:  Recent Labs Lab 12/29/16 1930 12/31/16 0016  WBC 6.5 8.7  NEUTROABS  --  5.8  HGB 13.6 13.8  HCT 41.7 43.4  MCV 95.2 96.7  PLT 176 448   Basic Metabolic Panel:  Recent Labs Lab 12/29/16 1930 12/31/16 0016 12/31/16 0017  NA 138 138  --   K 4.2 5.5*  --   CL 107 107  --   CO2 24 24  --   GLUCOSE 112* 147*  --   BUN 12 21*  --   CREATININE 1.06* 1.94*  --   CALCIUM 9.0 8.6*  --   PHOS  --   --  2.7   GFR: Estimated Creatinine Clearance: 32.2 mL/min (A) (by C-G formula based on SCr of 1.94 mg/dL (H)). Liver Function Tests:  Recent Labs Lab 12/29/16 1930 12/31/16 0016  AST 24 31  ALT 16 24  ALKPHOS 84 84  BILITOT  0.5 0.4  PROT 6.4* 7.3  ALBUMIN 3.6 4.0    Recent Labs Lab 12/29/16 1930 12/31/16 0018  LIPASE 18 15   No results for input(s): AMMONIA in the last 168 hours. Coagulation Profile:  Recent Labs Lab 12/31/16 0018  INR 1.12   Cardiac Enzymes:  Recent Labs Lab 12/31/16 0017  CKTOTAL 176   BNP (last 3 results) No results for input(s): PROBNP in the last 8760 hours. HbA1C: No results for input(s): HGBA1C in the last 72 hours. CBG: No results for input(s): GLUCAP in the last 168 hours.  Lipid Profile: No results for input(s): CHOL, HDL, LDLCALC, TRIG, CHOLHDL, LDLDIRECT in the last 72 hours. Thyroid Function Tests: No results for input(s): TSH, T4TOTAL, FREET4, T3FREE, THYROIDAB in the last 72 hours. Anemia Panel: No results for input(s): VITAMINB12, FOLATE, FERRITIN, TIBC, IRON, RETICCTPCT in the last 72 hours. Urine analysis:    Component Value Date/Time   COLORURINE YELLOW 12/31/2016 0143   APPEARANCEUR CLOUDY (A) 12/31/2016 0143   APPEARANCEUR Clear 06/28/2014 1754   LABSPEC 1.020 12/31/2016 0143   LABSPEC 1.032 06/28/2014 1754   PHURINE 5.0 12/31/2016 0143   GLUCOSEU NEGATIVE 12/31/2016 0143   GLUCOSEU Negative 06/28/2014 1754   HGBUR NEGATIVE 12/31/2016 0143   BILIRUBINUR NEGATIVE 12/31/2016 0143   BILIRUBINUR 1+ 06/28/2014 1754   KETONESUR NEGATIVE 12/31/2016 0143   PROTEINUR NEGATIVE 12/31/2016 0143   UROBILINOGEN 2.0 (H) 07/08/2014 2226   NITRITE NEGATIVE 12/31/2016 0143   LEUKOCYTESUR NEGATIVE 12/31/2016 0143   LEUKOCYTESUR Negative 06/28/2014 1754   Sepsis Labs: @LABRCNTIP (procalcitonin:4,lacticidven:4) )No results found for this or any previous visit (from the past 240 hour(s)).   Radiological Exams on Admission: Ct Head Wo Contrast  Result Date: 12/31/2016 CLINICAL DATA:  Altered mental status.  Lethargy. EXAM: CT HEAD WITHOUT CONTRAST TECHNIQUE: Contiguous axial images were obtained from the base of the skull through the vertex without  intravenous contrast. COMPARISON:  Face CT 07/31/2008 FINDINGS: Brain: No evidence of acute infarction, hemorrhage, hydrocephalus, extra-axial collection or mass lesion/mass effect. Vascular: Atherosclerosis of skullbase vasculature without hyperdense vessel or abnormal calcification. Skull: No fracture.  No focal lesion. Sinuses/Orbits: Mastoid air cells are underpneumatized, chronic. Improved paranasal sinus inflammation from prior exam, minimal residual mucosal thickening of posterior ethmoid air cells. Visualized orbits are unremarkable. Other: None. IMPRESSION: No acute intracranial abnormality. Electronically Signed   By: Jeb Levering M.D.   On: 12/31/2016 01:16   Ct Abdomen Pelvis W Contrast  Result Date: 12/30/2016 CLINICAL DATA:  Left lower quadrant abdominal pain radiating to the back EXAM: CT ABDOMEN AND PELVIS WITH CONTRAST TECHNIQUE: Multidetector CT imaging of the abdomen and pelvis was performed using the standard protocol following bolus administration of intravenous contrast. CONTRAST:  100 mL Isovue 300 intravenous COMPARISON:  Radiograph 06/29/2014, CT 06/28/2014 FINDINGS: Lower chest: Lung bases demonstrate no acute consolidation or pleural effusion. Mild coronary artery calcification. Small distal esophageal hiatal hernia. Hepatobiliary: Stable cyst in the left hepatic lobe. No calcified gallstones. No biliary dilatation. Pancreas: Unremarkable. No pancreatic ductal dilatation or surrounding inflammatory changes. Spleen: Normal in size without focal abnormality. Adrenals/Urinary Tract: Adrenal glands are within normal limits. Atrophic native kidneys with no hydronephrosis. Unremarkable appearing right lower quadrant renal transplant. Bladder normal Stomach/Bowel: The stomach is nonenlarged. No dilated small bowel. No colon wall thickening. Vascular/Lymphatic: Aortic atherosclerosis. No aneurysmal dilatation. Multiple enlarged retroperitoneal lymph nodes. These measure up to 1.7 cm in  diameter. Small nodules are nodes anterior to the aortic bifurcation. Lymph nodes adjacent to the left common iliac vessel. Left pelvic sidewall adenopathy measuring up to 1.7 cm. Presacral nodule measuring 1.8 cm with adjacent smaller presacral nodules noted. Reproductive: Uterus unremarkable.  3 cm right ovarian cyst Other: No free air or free fluid Musculoskeletal: Grade 1 anterolisthesis of L4 on L5. No acute or suspicious bone lesion IMPRESSION: 1. No acute abnormality is visualized 2. Interim development of retroperitoneal, iliac chain, and left pelvic sidewall adenopathy. Multiple soft tissue nodules in the presacral space. Findings are concerning for neoplasm, possible metastatic disease or lymphoma. Correlation with PET-CT is suggested. 3. 3 cm right  ovarian cyst. Electronically Signed   By: Donavan Foil M.D.   On: 12/30/2016 03:13   Dg Chest Port 1 View  Result Date: 12/31/2016 CLINICAL DATA:  16-year-old female with altered mental status. EXAM: PORTABLE CHEST 1 VIEW COMPARISON:  Chest CT dated 06/16/2014 FINDINGS: Minimal bibasilar atelectatic changes. No focal consolidation, pleural effusion, or pneumothorax. Top-normal cardiac size. Mild atherosclerotic calcification of the aortic arch. No acute osseous pathology. IMPRESSION: No active disease. Electronically Signed   By: Anner Crete M.D.   On: 12/31/2016 00:20    EKG: Independently reviewed. Normal sinus rhythm low voltage.  Assessment/Plan Principal Problem:   Acute encephalopathy Active Problems:   HLD (hyperlipidemia)   GOUT   Essential hypertension   KIDNEY TRANSPLANTATION, HX OF   NHL (non-Hodgkin's lymphoma) (H[facility])   ARF (acute renal failure) ([place])    1. Acute encephalopathy - cause not clear. Differentials include possible medications in the setting of acute renal failure. Will also get ammonia levels check MRI of the brain check EEG and continue with hydration and hold narcotics and gabapentin for now. Recheck ABG if  there is further worsening of CO2 then may need BiPAP. 2. Acute renal failure on renal transplant - continue with hydration. Since patient is on prednisone have placed patient on stress dose steroids. Restart Prograf once patient can take orally. 3. Left heel erythema - check uric acid level sedimentation rate. Continue antibiotics for now and follow cultures. Check x-rays once stable. 4. Elevated troponin likely from stress - cycle cardiac markers and check 2-D echo. 5. History of non-Hodgkin's lymphoma recent CAT scan shows possible recurrence. Check LDH. Patient was planning to follow with oncologist at [facility] and was not trying to go back to [facility]. 6. Hyperlipidemia on statins.   DVT prophylaxis: SCDs. Code Status: DO NOT INTUBATE.  Family Communication: Patient's daughter.  Disposition Plan: Home.  Consults called: None.  Admission status: Inpatient.    Rise Patience MD [facility] Pager [phone].  If 7PM-7AM, please contact night-coverage [email or web address] Password [email or web address]  12/31/2016, 5:06 AM

## 2016-12-31 NOTE — Progress Notes (Signed)
EEG Completed; Results Pending  

## 2016-12-31 NOTE — Plan of Care (Signed)
Problem: Safety: Goal: Ability to remain free from injury will improve Outcome: Completed/Met Date Met: 12/31/16 Bed alarm. Discussed safety prevention with family pt

## 2016-12-31 NOTE — Procedures (Signed)
ELECTROENCEPHALOGRAM REPORT  Date of Study: 12/31/2016  Patient's Name: Erica Woodward MRN: [id number] Date of Birth: 09/23/1945  Referring Provider: Dr. Gean Birchwood  Clinical History: This is a 71 year old woman with altered mental status.  Medications: acetaminophen (TYLENOL) tablet 650 mg  ceFEPIme (MAXIPIME) 2 g in dextrose 5 % 50 mL IVPB  hydrocortisone sodium succinate (SOLU-CORTEF) 100 MG injection 50 mg  ondansetron (ZOFRAN) injection 4 mg  ondansetron (ZOFRAN) tablet 4 mg  vancomycin (VANCOCIN) 1,250 mg in sodium chloride 0.9 % 250 mL IVPB   Technical Summary: A multichannel digital EEG recording measured by the international 10-20 system with electrodes applied with paste and impedances below 5000 ohms performed as portable with EKG monitoring in an awake patient.  Hyperventilation and photic stimulation were not performed.  The digital EEG was referentially recorded, reformatted, and digitally filtered in a variety of bipolar and referential montages for optimal display.   Description: The patient is awake and uncomfortable during the recording.  During maximal wakefulness, there is a symmetric, medium voltage 6-7 Hz posterior dominant rhythm that attenuates with eye opening. This is admixed with a moderate amount of diffuse 4-5 Hz theta and 2-3 Hz delta slowing of the waking background, at times with triphasic appearance. There is occasional focal delta slowing over the right hemisphere. Normal sleep architecture is not seen. Hyperventilation and photic stimulation were not performed.  There were no epileptiform discharges or electrographic seizures seen.    EKG lead showed sinus tachycardia.  Impression: This awake EEG is abnormal due to the presence of: 1. Moderate diffuse slowing of the waking background, at times with triphasic appearance 2. Additional occasional focal slowing over the right hemisphere  Clinical Correlation of the above findings indicates diffuse  cerebral dysfunction that is non-specific in etiology and can be seen with hypoxic/ischemic injury, toxic/metabolic encephalopathies, neurodegenerative disorders, or medication effect. Triphasic-like waves are usually seen with hepatic encephalopathy but can be seen with other metabolic encephalopathies as well. Additional focal slowing over the right hemisphere indicates focal cerebral dysfunction in this region suggestive of underlying structural or physiologic abnormality. The absence of epileptiform discharges does not rule out a clinical diagnosis of epilepsy.  Clinical correlation is advised.   Ellouise Newer, M.D.

## 2016-12-31 NOTE — ED Notes (Signed)
Patient transported to MRI 

## 2016-12-31 NOTE — ED Notes (Signed)
Report given to AMber Bushnell

## 2016-12-31 NOTE — Progress Notes (Addendum)
Pharmacy Antibiotic Note  Erica Woodward is a 71 y.o. female c/o bilateral leg pain admitted on 12/30/2016 with sepsis.  Pharmacy has been consulted for cefepime/vancomycin dosing.  Plan: Cefepime 2 Gm IV q24h Vancomycin 1 Gm ED + 1 Gm = 2 Gm load then 1250 mg IV q24h VT=15-20 mg/L F/u scr/cultures  Height: 5\' 4"  (162.6 cm) IBW/kg (Calculated) : 54.7  Temp (24hrs), Avg:98.2 F (36.8 C), Min:98.2 F (36.8 C), Max:98.2 F (36.8 C)   Recent Labs Lab 12/29/16 1930 12/31/16 0016 12/31/16 0030  WBC 6.5 8.7  --   CREATININE 1.06* 1.94*  --   LATICACIDVEN  --   --  2.58*    Estimated Creatinine Clearance: 32.2 mL/min (A) (by C-G formula based on SCr of 1.94 mg/dL (H)).    No Known Allergies  Antimicrobials this admission: 6/18 cefepime >>   6/18 vancomycin >>   Dose adjustments this admission:   Microbiology results:  BCx:   UCx:    Sputum:    MRSA PCR:   Thank you for allowing pharmacy to be a part of this patient's care.  Dorrene German 12/31/2016 1:24 AM

## 2016-12-31 NOTE — Progress Notes (Addendum)
PROGRESS NOTE    Erica Woodward   SFK:[id number]  DOB: 08-11-1945  DOA: 12/30/2016 PCP: Susy Frizzle, MD   Brief Narrative:  Erica Woodward is a 71 y.o. female with history of high-grade B-cell lymphoma treated in 2016 at [facility], kidney transplant on immunosuppressants, hyperlipidemia who was in the ER on 6/16 for nausea and underwent a CT of the abdomen pelvis. This showed lymphadenopathy consistent with possible cancer recurrence. She was discharged home and soon afterwards, began to have vomiting which was difficult to control. She subsequently developed "pain" in her left heel and in her abdomen. The family states that she was screaming in pain when she tried to walk and was behaving like she was disoriented. Her daughter decided to give her Hydrocordone for her pain and then decided to bring her back into the ER. In the ER the patient was noted to be quite sleepy and oriented only to self. She was noted to have tenderness and erythema in her left heel.    Subjective: Very sleepy when I evaluated her around 12 pm. She complains of pain in her abdomen and her foot but will not stay awake long enough to give a detailed history.   Assessment & Plan:   Principal Problem:   Acute encephalopathy - suspect dehydration, inability to rest proper due to vomiting for > 24 hrs and medications including Hydrocodone and Neurontin in the setting of AKI are causing this - she did not receive any medications in the ER on 6/17  Active Problems: Abdominal pain, nausea and vomiting - noted to be tender in LLQ- ? Diverticulitis vs colitis - underwent a CT abd/ pelvis on 6/18 without oral and with IV contrast which showed lymphadenopathy and no other etiology for pain - if pain persists, will need a repeat CT with oral and IV contrast - cont Cefepime, d/c Vancomycin- add Flagyl    ARF (acute renal failure)   h/o renal transplant - likely due to dehydration in setting of IV contrast -  steadily improving- cont IVF - cont Prograf- Prednisone changed to IV Hydrocortisone    NHL (non-Hodgkin's lymphoma)  - treated in 2016 and was in remission - appears to have a recurrence- will need a biopsy - will first need a CT chest with contrast to determine an appropriate site to be biopsied- I have spoken with oncology today-  FNA of an abdominal lymph node would not give an optimal sample and an excisional biopsy is preferred.  - I have spoken with Oncology,Dr Julien Nordmann,  today who is recommending she f/u with her original oncologist for a work up for the possible recurrence and if she wants local care, she can be referred to the [place] [facility].   Left heel pain - quite tender at left heel- try Voltaren gel and elevation- this is not gout   DVT prophylaxis: Lovenox Code Status: Partial code- no intubation Family Communication: daughter Disposition Plan: home when stable Consultants:    Procedures:    Antimicrobials:  Anti-infectives    Start     Dose/Rate Route Frequency Ordered Stop   01/01/17 0600  vancomycin (VANCOCIN) 1,250 mg in sodium chloride 0.9 % 250 mL IVPB     1,250 mg 166.7 mL/hr over 90 Minutes Intravenous Every 24 hours 12/31/16 0524     12/31/16 2200  ceFEPIme (MAXIPIME) 2 g in dextrose 5 % 50 mL IVPB     2 g 100 mL/hr over 30 Minutes Intravenous Every 24 hours 12/31/16  9201     12/31/16 0530  vancomycin (VANCOCIN) IVPB 1000 mg/200 mL premix     1,000 mg 200 mL/hr over 60 Minutes Intravenous NOW 12/31/16 0523 12/31/16 0905   12/31/16 0115  vancomycin (VANCOCIN) IVPB 1000 mg/200 mL premix     1,000 mg 200 mL/hr over 60 Minutes Intravenous  Once 12/31/16 0107 12/31/16 0537   12/31/16 0115  ceFEPIme (MAXIPIME) 2 g in dextrose 5 % 50 mL IVPB     2 g 100 mL/hr over 30 Minutes Intravenous STAT 12/31/16 0114 12/31/16 0258       Objective: Vitals:   12/31/16 0745 12/31/16 0800 12/31/16 0826 12/31/16 1122  BP:  (!) 141/70 (!) 141/70 129/68    Pulse: 86 87 90 87  Resp: 15 18 (!) 28 (!) 21  Temp:    100 F (37.8 C)  TempSrc:    Oral  SpO2: 98% 100% 98% 100%  Weight:    108 kg (238 lb 1.6 oz)  Height:    5\' 4"  (1.626 m)   No intake or output data in the 24 hours ending 12/31/16 1523 Filed Weights   12/31/16 0619 12/31/16 1122  Weight: 106.6 kg (235 lb) 108 kg (238 lb 1.6 oz)    Examination: General exam: Appears comfortable  HEENT: PERRLA, oral mucosa moist, no sclera icterus or thrush Respiratory system: Clear to auscultation. Respiratory effort normal. Cardiovascular system: S1 & S2 heard, RRR.  No murmurs  Gastrointestinal system: Abdomen soft, tender in LLQ, nondistended. Normal bowel sound. No organomegaly Central nervous system: Alert and oriented. No focal neurological deficits. Extremities: No cyanosis, clubbing or edema- tenderness and erythema in left heel Skin: No rashes or ulcers Psychiatry:  Mood & affect appropriate.     Data Reviewed: I have personally reviewed following labs and imaging studies  CBC:  Recent Labs Lab 12/29/16 1930 12/31/16 0016 12/31/16 0628  WBC 6.5 8.7 8.3  NEUTROABS  --  5.8 5.4  HGB 13.6 13.8 12.4  HCT 41.7 43.4 37.3  MCV 95.2 96.7 94.7  PLT 176 205 007   Basic Metabolic Panel:  Recent Labs Lab 12/29/16 1930 12/31/16 0016 12/31/16 0017 12/31/16 0628  NA 138 138  --  140  K 4.2 5.5*  --  5.0  CL 107 107  --  109  CO2 24 24  --  25  GLUCOSE 112* 147*  --  125*  BUN 12 21*  --  21*  CREATININE 1.06* 1.94*  --  1.56*  CALCIUM 9.0 8.6*  --  8.3*  PHOS  --   --  2.7  --    GFR: Estimated Creatinine Clearance: 40.3 mL/min (A) (by C-G formula based on SCr of 1.56 mg/dL (H)). Liver Function Tests:  Recent Labs Lab 12/29/16 1930 12/31/16 0016 12/31/16 0628  AST 24 31 30   ALT 16 24 20   ALKPHOS 84 84 72  BILITOT 0.5 0.4 0.7  PROT 6.4* 7.3 6.2*  ALBUMIN 3.6 4.0 3.6    Recent Labs Lab 12/29/16 1930 12/31/16 0018  LIPASE 18 15    Recent Labs Lab  12/31/16 0816  AMMONIA 13   Coagulation Profile:  Recent Labs Lab 12/31/16 0018  INR 1.12   Cardiac Enzymes:  Recent Labs Lab 12/31/16 0017 12/31/16 0628 12/31/16 1155  CKTOTAL 176  --   --   TROPONINI  --  0.28* 0.20*   BNP (last 3 results) No results for input(s): PROBNP in the last 8760 hours. HbA1C: No results for input(s):  HGBA1C in the last 72 hours. CBG:  Recent Labs Lab 12/31/16 0815  GLUCAP 136*   Lipid Profile: No results for input(s): CHOL, HDL, LDLCALC, TRIG, CHOLHDL, LDLDIRECT in the last 72 hours. Thyroid Function Tests:  Recent Labs  12/31/16 0628  TSH 0.878   Anemia Panel: No results for input(s): VITAMINB12, FOLATE, FERRITIN, TIBC, IRON, RETICCTPCT in the last 72 hours. Urine analysis:    Component Value Date/Time   COLORURINE YELLOW 12/31/2016 0143   APPEARANCEUR CLOUDY (A) 12/31/2016 0143   APPEARANCEUR Clear 06/28/2014 1754   LABSPEC 1.020 12/31/2016 0143   LABSPEC 1.032 06/28/2014 1754   PHURINE 5.0 12/31/2016 0143   GLUCOSEU NEGATIVE 12/31/2016 0143   GLUCOSEU Negative 06/28/2014 1754   HGBUR NEGATIVE 12/31/2016 0143   BILIRUBINUR NEGATIVE 12/31/2016 0143   BILIRUBINUR 1+ 06/28/2014 1754   KETONESUR NEGATIVE 12/31/2016 0143   PROTEINUR NEGATIVE 12/31/2016 0143   UROBILINOGEN 2.0 (H) 07/08/2014 2226   NITRITE NEGATIVE 12/31/2016 0143   LEUKOCYTESUR NEGATIVE 12/31/2016 0143   LEUKOCYTESUR Negative 06/28/2014 1754   Sepsis Labs: @LABRCNTIP (procalcitonin:4,lacticidven:4) )No results found for this or any previous visit (from the past 240 hour(s)).       Radiology Studies: Ct Head Wo Contrast  Result Date: 12/31/2016 CLINICAL DATA:  Altered mental status.  Lethargy. EXAM: CT HEAD WITHOUT CONTRAST TECHNIQUE: Contiguous axial images were obtained from the base of the skull through the vertex without intravenous contrast. COMPARISON:  Face CT 07/31/2008 FINDINGS: Brain: No evidence of acute infarction, hemorrhage, hydrocephalus,  extra-axial collection or mass lesion/mass effect. Vascular: Atherosclerosis of skullbase vasculature without hyperdense vessel or abnormal calcification. Skull: No fracture.  No focal lesion. Sinuses/Orbits: Mastoid air cells are underpneumatized, chronic. Improved paranasal sinus inflammation from prior exam, minimal residual mucosal thickening of posterior ethmoid air cells. Visualized orbits are unremarkable. Other: None. IMPRESSION: No acute intracranial abnormality. Electronically Signed   By: Jeb Levering M.D.   On: 12/31/2016 01:16   Mr Brain Wo Contrast  Result Date: 12/31/2016 CLINICAL DATA:  Altered mental status.  Acute encephalopathy EXAM: MRI HEAD WITHOUT CONTRAST TECHNIQUE: Multiplanar, multiecho pulse sequences of the brain and surrounding structures were obtained without intravenous contrast. COMPARISON:  CT head 12/31/2016 FINDINGS: Brain: Image quality degraded by motion. Ventricle size normal. Negative for acute or chronic infarction. Negative for hemorrhage or fluid collection. Negative for mass or edema. Vascular: Normal arterial flow void Skull and upper cervical spine: Negative Sinuses/Orbits: Negative Other: None IMPRESSION: Negative MRI of the brain without contrast. Image quality degraded by motion. Electronically Signed   By: Franchot Gallo M.D.   On: 12/31/2016 13:57   Ct Abdomen Pelvis W Contrast  Result Date: 12/30/2016 CLINICAL DATA:  Left lower quadrant abdominal pain radiating to the back EXAM: CT ABDOMEN AND PELVIS WITH CONTRAST TECHNIQUE: Multidetector CT imaging of the abdomen and pelvis was performed using the standard protocol following bolus administration of intravenous contrast. CONTRAST:  100 mL Isovue 300 intravenous COMPARISON:  Radiograph 06/29/2014, CT 06/28/2014 FINDINGS: Lower chest: Lung bases demonstrate no acute consolidation or pleural effusion. Mild coronary artery calcification. Small distal esophageal hiatal hernia. Hepatobiliary: Stable cyst in the  left hepatic lobe. No calcified gallstones. No biliary dilatation. Pancreas: Unremarkable. No pancreatic ductal dilatation or surrounding inflammatory changes. Spleen: Normal in size without focal abnormality. Adrenals/Urinary Tract: Adrenal glands are within normal limits. Atrophic native kidneys with no hydronephrosis. Unremarkable appearing right lower quadrant renal transplant. Bladder normal Stomach/Bowel: The stomach is nonenlarged. No dilated small bowel. No colon wall thickening. Vascular/Lymphatic: Aortic  atherosclerosis. No aneurysmal dilatation. Multiple enlarged retroperitoneal lymph nodes. These measure up to 1.7 cm in diameter. Small nodules are nodes anterior to the aortic bifurcation. Lymph nodes adjacent to the left common iliac vessel. Left pelvic sidewall adenopathy measuring up to 1.7 cm. Presacral nodule measuring 1.8 cm with adjacent smaller presacral nodules noted. Reproductive: Uterus unremarkable.  3 cm right ovarian cyst Other: No free air or free fluid Musculoskeletal: Grade 1 anterolisthesis of L4 on L5. No acute or suspicious bone lesion IMPRESSION: 1. No acute abnormality is visualized 2. Interim development of retroperitoneal, iliac chain, and left pelvic sidewall adenopathy. Multiple soft tissue nodules in the presacral space. Findings are concerning for neoplasm, possible metastatic disease or lymphoma. Correlation with PET-CT is suggested. 3. 3 cm right ovarian cyst. Electronically Signed   By: Donavan Foil M.D.   On: 12/30/2016 03:13   Dg Chest Port 1 View  Result Date: 12/31/2016 CLINICAL DATA:  19-year-old female with altered mental status. EXAM: PORTABLE CHEST 1 VIEW COMPARISON:  Chest CT dated 06/16/2014 FINDINGS: Minimal bibasilar atelectatic changes. No focal consolidation, pleural effusion, or pneumothorax. Top-normal cardiac size. Mild atherosclerotic calcification of the aortic arch. No acute osseous pathology. IMPRESSION: No active disease. Electronically Signed   By:  Anner Crete M.D.   On: 12/31/2016 00:20      Scheduled Meds: . gi cocktail  30 mL Oral TID  . gi cocktail      . hydrocortisone sod succinate (SOLU-CORTEF) inj  50 mg Intravenous Q6H  . metoCLOPramide      . morphine      . tacrolimus  1.5 mg Oral BID   Continuous Infusions: . sodium chloride 100 mL/hr at 12/31/16 1435  . ceFEPime (MAXIPIME) IV    . [START ON 01/01/2017] vancomycin       LOS: 0 days    Time spent in minutes:35    Debbe Odea, MD [facility] Pager: [email or web address] Password [facility] 12/31/2016, 3:23 PM

## 2017-01-01 ENCOUNTER — Telehealth: Payer: Self-pay | Admitting: *Deleted

## 2017-01-01 ENCOUNTER — Inpatient Hospital Stay (HOSPITAL_COMMUNITY): Payer: Medicare HMO

## 2017-01-01 DIAGNOSIS — I10 Essential (primary) hypertension: Secondary | ICD-10-CM

## 2017-01-01 DIAGNOSIS — C8595 Non-Hodgkin lymphoma, unspecified, lymph nodes of inguinal region and lower limb: Secondary | ICD-10-CM

## 2017-01-01 DIAGNOSIS — R591 Generalized enlarged lymph nodes: Secondary | ICD-10-CM

## 2017-01-01 LAB — BASIC METABOLIC PANEL
ANION GAP: 7 (ref 5–15)
BUN: 22 mg/dL — ABNORMAL HIGH (ref 6–20)
CHLORIDE: 112 mmol/L — AB (ref 101–111)
CO2: 24 mmol/L (ref 22–32)
Calcium: 8.2 mg/dL — ABNORMAL LOW (ref 8.9–10.3)
Creatinine, Ser: 1.1 mg/dL — ABNORMAL HIGH (ref 0.44–1.00)
GFR calc non Af Amer: 50 mL/min — ABNORMAL LOW (ref >60)
GFR, EST AFRICAN AMERICAN: 58 mL/min — AB (ref >60)
Glucose, Bld: 134 mg/dL — ABNORMAL HIGH (ref 65–99)
Potassium: 5 mmol/L (ref 3.5–5.1)
Sodium: 143 mmol/L (ref 135–145)

## 2017-01-01 LAB — CBC
HEMATOCRIT: 36.3 % (ref 36.0–46.0)
HEMOGLOBIN: 11.4 g/dL — AB (ref 12.0–15.0)
MCH: 30.8 pg (ref 26.0–34.0)
MCHC: 31.4 g/dL (ref 30.0–36.0)
MCV: 98.1 fL (ref 78.0–100.0)
Platelets: 181 10*3/uL (ref 150–400)
RBC: 3.7 MIL/uL — ABNORMAL LOW (ref 3.87–5.11)
RDW: 14.8 % (ref 11.5–15.5)
WBC: 8.7 10*3/uL (ref 4.0–10.5)

## 2017-01-01 LAB — GLUCOSE, CAPILLARY: GLUCOSE-CAPILLARY: 162 mg/dL — AB (ref 65–99)

## 2017-01-01 MED ORDER — SODIUM CHLORIDE 0.9 % IV BOLUS (SEPSIS)
1000.0000 mL | Freq: Once | INTRAVENOUS | Status: AC
  Administered 2017-01-01: 1000 mL via INTRAVENOUS

## 2017-01-01 MED ORDER — SODIUM CHLORIDE 0.9% FLUSH: 10.0000 mL | INTRAVENOUS | Status: DC | PRN

## 2017-01-01 MED ORDER — DEXTROSE 5 % IV SOLN
2.0000 g | Freq: Two times a day (BID) | INTRAVENOUS | Status: DC
  Administered 2017-01-01 – 2017-01-02 (×2): 2 g via INTRAVENOUS
  Filled 2017-01-01 (×2): qty 2

## 2017-01-01 MED ORDER — SODIUM CHLORIDE 0.9 % IV SOLN
INTRAVENOUS | Status: DC
  Administered 2017-01-01 – 2017-01-02 (×3): via INTRAVENOUS

## 2017-01-01 MED ORDER — SODIUM CHLORIDE 0.9% FLUSH
10.0000 mL | Freq: Two times a day (BID) | INTRAVENOUS | Status: DC
  Administered 2017-01-01 – 2017-01-02 (×2): 10 mL

## 2017-01-01 NOTE — Progress Notes (Signed)
Peripherally Inserted Central Catheter/Midline Placement  The IV Nurse has discussed with the patient and/or persons authorized to consent for the patient, the purpose of this procedure and the potential benefits and risks involved with this procedure.  The benefits include less needle sticks, lab draws from the catheter, and the patient may be discharged home with the catheter. Risks include, but not limited to, infection, bleeding, blood clot (thrombus formation), and puncture of an artery; nerve damage and irregular heartbeat and possibility to perform a PICC exchange if needed/ordered by physician.  Alternatives to this procedure were also discussed.  Bard Power PICC patient education guide, fact sheet on infection prevention and patient information card has been provided to patient /or left at bedside.    PICC/Midline Placement Documentation  PICC Double Lumen 01/01/17 PICC Left 44 cm 0 cm (Active)  Indication for Insertion or Continuance of Line Poor Vasculature-patient has had multiple peripheral attempts or PIVs lasting less than 24 hours 01/01/2017  8:58 PM  Exposed Catheter (cm) 0 cm 01/01/2017  8:58 PM  Site Assessment Clean;Dry;Intact 01/01/2017  8:58 PM  Lumen #1 Status Flushed;Saline locked;Blood return noted 01/01/2017  8:58 PM  Lumen #2 Status Flushed;Saline locked;Blood return noted 01/01/2017  8:58 PM  Dressing Type Transparent 01/01/2017  8:58 PM  Dressing Status Clean;Dry;Intact 01/01/2017  8:58 PM  Dressing Change Due 01/08/17 01/01/2017  8:58 PM       Erica Woodward 01/01/2017, 8:59 PM

## 2017-01-01 NOTE — Progress Notes (Signed)
PROGRESS NOTE    Erica Woodward   ALP:[id number]  DOB: 01-Jan-1946  DOA: 12/30/2016 PCP: Susy Frizzle, MD   Brief Narrative:  Erica Woodward is a 71 y.o. female with history of high-grade B-cell lymphoma treated in 2016 at [facility], kidney transplant on immunosuppressants, hyperlipidemia who was in the ER on 6/16 for nausea and underwent a CT of the abdomen pelvis. This showed lymphadenopathy consistent with possible cancer recurrence. She was discharged home and soon afterwards, began to have vomiting which was difficult to control. She subsequently developed "pain" in her left heel and in her abdomen. The family states that she was screaming in pain when she tried to walk and was behaving like she was disoriented. Her daughter decided to give her Hydrocordone for her pain and then decided to bring her back into the ER. In the ER the patient was noted to be quite sleepy and oriented only to self. She was noted to have tenderness and erythema in her left heel.    Subjective: Sleeping again this AM. Family states she was vomiting up until 7 PM yesterday but after receiving nausea and pain medications, she is sleeping. She has not rested in about 3 days and may be trying to catch up.   Assessment & Plan:   Principal Problem:   Acute encephalopathy - CT head without contrast unrevealing - suspect dehydration, inability to rest properly due to vomiting for > 24 hrs and medications including Hydrocodone and Neurontin in the setting of AKI may be causing her mental status change - she did not receive any medications in the ER on 6/17 - was quite sleepy yesterday but did awaken and attempt to communicate- now is completely asleep and difficult to assess mental status  Active Problems: Abdominal pain, nausea and vomiting - noted to be tender in LLQ- ? Diverticulitis vs colitis - she underwent a CT abd/ pelvis on 6/18 without oral and with IV contrast which showed lymphadenopathy and no  other etiology for pain - if LLQ pain persists, will need a repeat CT with oral contrast- I am hesitant to give her more IV contrast as her renal function is just recovering - cont Cefepime, d/c Vancomycin- add Flagyl- continue for intra-abdominal pathology    ARF (acute renal failure)   h/o renal transplant - likely due to dehydration in setting of IV contrast - steadily improving- cont IVF - Prograf ordered but she has been too sedated to take it - Prednisone changed to IV Hydrocortisone    NHL (non-Hodgkin's lymphoma)  - treated in 2016 and was in remission - appears to have a recurrence- will need a biopsy -   FNA of a lymph node would not give an optimal sample and an excisional biopsy is preferred - as the abdominal lympnode are difficult to obtain without a laparoscopy, will need a CT chest with contrast to determine a better site to be biopsied - I spoke with Oncology, Dr Julien Nordmann,  6/18, who is recommending she f/u with her original oncologist for a work up for the possible recurrence and if she wants local care, she can be referred to the [facility].   Left heel pain - quite tender at left heel- improving Voltaren gel and IV antibiotics (Localized cellulitis?)- the family insists that she may have "broken" some bones in her foot and are requesting an xray which I have ordered- this does not reveal any fractures   DVT prophylaxis: Lovenox Code Status: Partial code- no  intubation Family Communication: daughter Disposition Plan: home when stable Consultants:   D/w Dr Julien Nordmann Procedures:    Antimicrobials:  Anti-infectives    Start     Dose/Rate Route Frequency Ordered Stop   01/01/17 2200  ceFEPIme (MAXIPIME) 2 g in dextrose 5 % 50 mL IVPB     2 g 100 mL/hr over 30 Minutes Intravenous Every 12 hours 01/01/17 1247     01/01/17 0600  vancomycin (VANCOCIN) 1,250 mg in sodium chloride 0.9 % 250 mL IVPB  Status:  Discontinued     1,250 mg 166.7 mL/hr over 90  Minutes Intravenous Every 24 hours 12/31/16 0524 12/31/16 1608   12/31/16 2200  ceFEPIme (MAXIPIME) 2 g in dextrose 5 % 50 mL IVPB  Status:  Discontinued     2 g 100 mL/hr over 30 Minutes Intravenous Every 24 hours 12/31/16 0452 01/01/17 1247   12/31/16 1700  metroNIDAZOLE (FLAGYL) IVPB 500 mg     500 mg 100 mL/hr over 60 Minutes Intravenous Every 8 hours 12/31/16 1618     12/31/16 0530  vancomycin (VANCOCIN) IVPB 1000 mg/200 mL premix     1,000 mg 200 mL/hr over 60 Minutes Intravenous NOW 12/31/16 0523 12/31/16 0905   12/31/16 0115  vancomycin (VANCOCIN) IVPB 1000 mg/200 mL premix     1,000 mg 200 mL/hr over 60 Minutes Intravenous  Once 12/31/16 0107 12/31/16 0537   12/31/16 0115  ceFEPIme (MAXIPIME) 2 g in dextrose 5 % 50 mL IVPB     2 g 100 mL/hr over 30 Minutes Intravenous STAT 12/31/16 0114 12/31/16 0258       Objective: Vitals:   12/31/16 0826 12/31/16 1122 12/31/16 2002 01/01/17 0500  BP: (!) 141/70 129/68 (!) 147/77 (!) 142/90  Pulse: 90 87 93 (!) 109  Resp: (!) 28 (!) 21 20 20   Temp:  100 F (37.8 C) 98.1 F (36.7 C) 98.2 F (36.8 C)  TempSrc:  Oral Oral Oral  SpO2: 98% 100% 93% 93%  Weight:  108 kg (238 lb 1.6 oz)    Height:  5\' 4"  (1.626 m)      Intake/Output Summary (Last 24 hours) at 01/01/17 1402 Last data filed at 01/01/17 0912  Gross per 24 hour  Intake             1090 ml  Output              350 ml  Net              740 ml   Filed Weights   12/31/16 0619 12/31/16 1122  Weight: 106.6 kg (235 lb) 108 kg (238 lb 1.6 oz)    Examination: General exam: sleeping/ snoring - difficult to awaken HEENT: PERRLA, oral mucosa moist, no sclera icterus or thrush Respiratory system: Clear to auscultation. Respiratory effort normal. Cardiovascular system: S1 & S2 heard, RRR.  No murmurs  Gastrointestinal system: Abdomen soft, no significant tenderness noted today because the patient is asleep during the exam, nondistended. Normal bowel sound. No  organomegaly Central nervous system: Alert and oriented. No focal neurological deficits. Extremities: No cyanosis, clubbing or edema-  erythema in left heel has resolved Skin: No rashes or ulcers Psychiatry:  Mood & affect appropriate.     Data Reviewed: I have personally reviewed following labs and imaging studies  CBC:  Recent Labs Lab 12/29/16 1930 12/31/16 0016 12/31/16 0628 01/01/17 0730  WBC 6.5 8.7 8.3 8.7  NEUTROABS  --  5.8 5.4  --   HGB  13.6 13.8 12.4 11.4*  HCT 41.7 43.4 37.3 36.3  MCV 95.2 96.7 94.7 98.1  PLT 176 205 186 010   Basic Metabolic Panel:  Recent Labs Lab 12/29/16 1930 12/31/16 0016 12/31/16 0017 12/31/16 0628 01/01/17 0730  NA 138 138  --  140 143  K 4.2 5.5*  --  5.0 5.0  CL 107 107  --  109 112*  CO2 24 24  --  25 24  GLUCOSE 112* 147*  --  125* 134*  BUN 12 21*  --  21* 22*  CREATININE 1.06* 1.94*  --  1.56* 1.10*  CALCIUM 9.0 8.6*  --  8.3* 8.2*  PHOS  --   --  2.7  --   --    GFR: Estimated Creatinine Clearance: 57.1 mL/min (A) (by C-G formula based on SCr of 1.1 mg/dL (H)). Liver Function Tests:  Recent Labs Lab 12/29/16 1930 12/31/16 0016 12/31/16 0628  AST 24 31 30   ALT 16 24 20   ALKPHOS 84 84 72  BILITOT 0.5 0.4 0.7  PROT 6.4* 7.3 6.2*  ALBUMIN 3.6 4.0 3.6    Recent Labs Lab 12/29/16 1930 12/31/16 0018  LIPASE 18 15    Recent Labs Lab 12/31/16 0816  AMMONIA 13   Coagulation Profile:  Recent Labs Lab 12/31/16 0018  INR 1.12   Cardiac Enzymes:  Recent Labs Lab 12/31/16 0017 12/31/16 0628 12/31/16 1155 12/31/16 1814  CKTOTAL 176  --   --   --   TROPONINI  --  0.28* 0.20* 0.17*   BNP (last 3 results) No results for input(s): PROBNP in the last 8760 hours. HbA1C: No results for input(s): HGBA1C in the last 72 hours. CBG:  Recent Labs Lab 12/31/16 0815 12/31/16 2356 01/01/17 0740  GLUCAP 136* 141* 162*   Lipid Profile: No results for input(s): CHOL, HDL, LDLCALC, TRIG, CHOLHDL,  LDLDIRECT in the last 72 hours. Thyroid Function Tests:  Recent Labs  12/31/16 0628  TSH 0.878   Anemia Panel: No results for input(s): VITAMINB12, FOLATE, FERRITIN, TIBC, IRON, RETICCTPCT in the last 72 hours. Urine analysis:    Component Value Date/Time   COLORURINE YELLOW 12/31/2016 0143   APPEARANCEUR CLOUDY (A) 12/31/2016 0143   APPEARANCEUR Clear 06/28/2014 1754   LABSPEC 1.020 12/31/2016 0143   LABSPEC 1.032 06/28/2014 1754   PHURINE 5.0 12/31/2016 0143   GLUCOSEU NEGATIVE 12/31/2016 0143   GLUCOSEU Negative 06/28/2014 1754   HGBUR NEGATIVE 12/31/2016 0143   BILIRUBINUR NEGATIVE 12/31/2016 0143   BILIRUBINUR 1+ 06/28/2014 1754   KETONESUR NEGATIVE 12/31/2016 0143   PROTEINUR NEGATIVE 12/31/2016 0143   UROBILINOGEN 2.0 (H) 07/08/2014 2226   NITRITE NEGATIVE 12/31/2016 0143   LEUKOCYTESUR NEGATIVE 12/31/2016 0143   LEUKOCYTESUR Negative 06/28/2014 1754   Sepsis Labs: @LABRCNTIP (procalcitonin:4,lacticidven:4) )No results found for this or any previous visit (from the past 240 hour(s)).       Radiology Studies: Ct Head Wo Contrast  Result Date: 12/31/2016 CLINICAL DATA:  Altered mental status.  Lethargy. EXAM: CT HEAD WITHOUT CONTRAST TECHNIQUE: Contiguous axial images were obtained from the base of the skull through the vertex without intravenous contrast. COMPARISON:  Face CT 07/31/2008 FINDINGS: Brain: No evidence of acute infarction, hemorrhage, hydrocephalus, extra-axial collection or mass lesion/mass effect. Vascular: Atherosclerosis of skullbase vasculature without hyperdense vessel or abnormal calcification. Skull: No fracture.  No focal lesion. Sinuses/Orbits: Mastoid air cells are underpneumatized, chronic. Improved paranasal sinus inflammation from prior exam, minimal residual mucosal thickening of posterior ethmoid air cells. Visualized orbits are  unremarkable. Other: None. IMPRESSION: No acute intracranial abnormality. Electronically Signed   By: Jeb Levering M.D.   On: 12/31/2016 01:16   Mr Brain Wo Contrast  Result Date: 12/31/2016 CLINICAL DATA:  Altered mental status.  Acute encephalopathy EXAM: MRI HEAD WITHOUT CONTRAST TECHNIQUE: Multiplanar, multiecho pulse sequences of the brain and surrounding structures were obtained without intravenous contrast. COMPARISON:  CT head 12/31/2016 FINDINGS: Brain: Image quality degraded by motion. Ventricle size normal. Negative for acute or chronic infarction. Negative for hemorrhage or fluid collection. Negative for mass or edema. Vascular: Normal arterial flow void Skull and upper cervical spine: Negative Sinuses/Orbits: Negative Other: None IMPRESSION: Negative MRI of the brain without contrast. Image quality degraded by motion. Electronically Signed   By: Franchot Gallo M.D.   On: 12/31/2016 13:57   Dg Chest Port 1 View  Result Date: 12/31/2016 CLINICAL DATA:  89-year-old female with altered mental status. EXAM: PORTABLE CHEST 1 VIEW COMPARISON:  Chest CT dated 06/16/2014 FINDINGS: Minimal bibasilar atelectatic changes. No focal consolidation, pleural effusion, or pneumothorax. Top-normal cardiac size. Mild atherosclerotic calcification of the aortic arch. No acute osseous pathology. IMPRESSION: No active disease. Electronically Signed   By: Anner Crete M.D.   On: 12/31/2016 00:20   Dg Foot 2 Views Left  Result Date: 01/01/2017 CLINICAL DATA:  Fall. EXAM: LEFT FOOT - 2 VIEW COMPARISON:  No prior available. FINDINGS: Diffuse degenerative change. No acute bony or joint abnormality. No evidence of fracture or dislocation. IMPRESSION: Diffuse degenerative change.  No acute abnormality . Electronically Signed   By: Marcello Moores  Register   On: 01/01/2017 12:20      Scheduled Meds: . gi cocktail  30 mL Oral TID  . hydrocortisone sod succinate (SOLU-CORTEF) inj  50 mg Intravenous Q8H  . metoCLOPramide (REGLAN) injection  5 mg Intravenous Q6H  . ondansetron  4 mg Oral Q6H   Or  . ondansetron (ZOFRAN) IV   4 mg Intravenous Q6H  . tacrolimus  1.5 mg Oral BID   Continuous Infusions: . sodium chloride 125 mL/hr at 01/01/17 1338  . ceFEPime (MAXIPIME) IV    . metronidazole Stopped (01/01/17 1012)     LOS: 1 day    Time spent in minutes:35    Debbe Odea, MD [facility] Pager: [email or web address] Password [email or web address] 01/01/2017, 2:02 PM

## 2017-01-01 NOTE — Progress Notes (Signed)
Patient has not voided since start of shift. Has been mostly lethargic until this afternoon. Patient is now alert and says she doesn't feel the need to urinate. Bladder scan showed 250cc. Will continue to monitor patient's output.

## 2017-01-01 NOTE — Addendum Note (Signed)
Addended by: Jerl Santos R on: 01/01/2017 03:18 PM   Modules accepted: Orders

## 2017-01-01 NOTE — Telephone Encounter (Signed)
Daughter phoned to let us know that her mom has been Dx with cancer again. Last time she was Dx they referred her to [place] and daughter stated this was a big hardship for the family to get there and spend time with her. Would like a referral put in to [facility] in [place]. The hospital already did this but apparently it must be done by PCP.

## 2017-01-01 NOTE — Progress Notes (Signed)
Pharmacy Antibiotic Note  Erica Woodward is a 71 y.o. female c/o bilateral leg pain admitted on 12/30/2016 with sepsis.  Pharmacy has been consulted for cefepime/vancomycin dosing.  Plan:  Adjust Cefepime to 2 Gm IV q12h with improvement in renal function  F/u scr/cultures; will continue daily SCr given renal Txplant  Height: 5\' 4"  (162.6 cm) Weight: 238 lb 1.6 oz (108 kg) IBW/kg (Calculated) : 54.7  Temp (24hrs), Avg:98.2 F (36.8 C), Min:98.1 F (36.7 C), Max:98.2 F (36.8 C)   Recent Labs Lab 12/29/16 1930 12/31/16 0016 12/31/16 0030 12/31/16 0628 01/01/17 0730  WBC 6.5 8.7  --  8.3 8.7  CREATININE 1.06* 1.94*  --  1.56* 1.10*  LATICACIDVEN  --   --  2.58*  --   --     Estimated Creatinine Clearance: 57.1 mL/min (A) (by C-G formula based on SCr of 1.1 mg/dL (H)).    No Known Allergies  Antimicrobials this admission: 6/18 cefepime >>  6/18 vancomycin >> 6/18 6/18 Flagyl >>  Dose adjustments this admission: 6/19 Increase cefepime to 2g q24 for improved CrCl  Microbiology results: 6/18 BCx: sent  Thank you for allowing pharmacy to be a part of this patient's care.  Reuel Boom, PharmD, BCPS Pager: [phone] 01/01/2017, 12:46 PM

## 2017-01-01 NOTE — Telephone Encounter (Signed)
Please refer to cancer center here.

## 2017-01-02 DIAGNOSIS — G9340 Encephalopathy, unspecified: Secondary | ICD-10-CM

## 2017-01-02 LAB — BASIC METABOLIC PANEL
ANION GAP: 9 (ref 5–15)
BUN: 34 mg/dL — ABNORMAL HIGH (ref 6–20)
CALCIUM: 8.1 mg/dL — AB (ref 8.9–10.3)
CO2: 19 mmol/L — ABNORMAL LOW (ref 22–32)
Chloride: 111 mmol/L (ref 101–111)
Creatinine, Ser: 1.58 mg/dL — ABNORMAL HIGH (ref 0.44–1.00)
GFR, EST AFRICAN AMERICAN: 37 mL/min — AB (ref >60)
GFR, EST NON AFRICAN AMERICAN: 32 mL/min — AB (ref >60)
Glucose, Bld: 137 mg/dL — ABNORMAL HIGH (ref 65–99)
Potassium: 4 mmol/L (ref 3.5–5.1)
SODIUM: 139 mmol/L (ref 135–145)

## 2017-01-02 LAB — GLUCOSE, CAPILLARY
GLUCOSE-CAPILLARY: 120 mg/dL — AB (ref 65–99)
GLUCOSE-CAPILLARY: 134 mg/dL — AB (ref 65–99)
Glucose-Capillary: 109 mg/dL — ABNORMAL HIGH (ref 65–99)
Glucose-Capillary: 127 mg/dL — ABNORMAL HIGH (ref 65–99)
Glucose-Capillary: 130 mg/dL — ABNORMAL HIGH (ref 65–99)

## 2017-01-02 LAB — CBC WITH DIFFERENTIAL/PLATELET
BASOS ABS: 0 10*3/uL (ref 0.0–0.1)
BASOS PCT: 0 %
EOS ABS: 0 10*3/uL (ref 0.0–0.7)
EOS PCT: 0 %
HCT: 34.5 % — ABNORMAL LOW (ref 36.0–46.0)
Hemoglobin: 10.8 g/dL — ABNORMAL LOW (ref 12.0–15.0)
Lymphocytes Relative: 11 %
Lymphs Abs: 0.7 10*3/uL (ref 0.7–4.0)
MCH: 30.5 pg (ref 26.0–34.0)
MCHC: 31.3 g/dL (ref 30.0–36.0)
MCV: 97.5 fL (ref 78.0–100.0)
MONO ABS: 0.6 10*3/uL (ref 0.1–1.0)
Monocytes Relative: 8 %
NEUTROS ABS: 5.5 10*3/uL (ref 1.7–7.7)
Neutrophils Relative %: 81 %
PLATELETS: 135 10*3/uL — AB (ref 150–400)
RBC: 3.54 MIL/uL — ABNORMAL LOW (ref 3.87–5.11)
RDW: 14.6 % (ref 11.5–15.5)
WBC: 6.8 10*3/uL (ref 4.0–10.5)

## 2017-01-02 LAB — CREATININE, SERUM
Creatinine, Ser: 1.53 mg/dL — ABNORMAL HIGH (ref 0.44–1.00)
GFR, EST AFRICAN AMERICAN: 39 mL/min — AB (ref >60)
GFR, EST NON AFRICAN AMERICAN: 33 mL/min — AB (ref >60)

## 2017-01-02 MED ORDER — SODIUM CHLORIDE 0.9 % IV SOLN
INTRAVENOUS | Status: DC
  Administered 2017-01-02: 23:00:00 via INTRAVENOUS

## 2017-01-02 MED ORDER — DEXTROSE 5 % IV SOLN
1.0000 g | Freq: Two times a day (BID) | INTRAVENOUS | Status: DC
  Administered 2017-01-02 – 2017-01-03 (×2): 1 g via INTRAVENOUS
  Filled 2017-01-02 (×2): qty 1

## 2017-01-02 MED ORDER — PREDNISONE 10 MG PO TABS
10.0000 mg | ORAL_TABLET | Freq: Every day | ORAL | Status: DC
  Administered 2017-01-03: 10 mg via ORAL
  Filled 2017-01-02: qty 1

## 2017-01-02 NOTE — Progress Notes (Signed)
PROGRESS NOTE    Shakyra Mattera  HQI:[id number] DOB: 09/24/1945 DOA: 12/30/2016 PCP: Susy Frizzle, MD     Brief Narrative:  Erica Woodward a 71 y.o.femalewith history of high-grade B-cell lymphoma treated in 2016 at [facility], kidney transplant on immunosuppressants, hyperlipidemia who was in the ER on 6/16 for nausea and underwent a CT of the abdomen pelvis. This showed lymphadenopathy consistent with possible cancer recurrence. She was discharged home and soon afterwards, began to have vomiting which was difficult to control. She subsequently developed "pain" in her left heel and in her abdomen. The family states that she was screaming in pain when she tried to walk and was behaving like she was disoriented. Her daughter decided to give her Hydrocodone for her pain and then decided to bring her back into the ER. In the ER the patient was noted to be quite sleepy and oriented only to self. She was noted to have tenderness and erythema in her left heel.     Assessment & Plan:   Principal Problem:   Acute encephalopathy Active Problems:   HLD (hyperlipidemia)   GOUT   Essential hypertension   KIDNEY TRANSPLANTATION, HX OF   NHL (non-Hodgkin's lymphoma) (HCC)   ARF (acute renal failure) (HCC)   Acute encephalopathy - CT head without contrast unrevealing - Suspect dehydration, inability to rest properly due to vomiting for > 24 hrs and medications including Hydrocodone and Neurontin in the setting of AKI may be causing her mental status change - Resolved back to baseline this morning per family   Abdominal pain, nausea and vomiting - Noted to be tender in LLQ- ? Diverticulitis vs colitis - She underwent a CT abd/ pelvis on 6/18 without oral and with IV contrast which showed lymphadenopathy and no other etiology for pain - Could consider repeat CT with contrast, however, due to impaired renal function, will hold off. Pain has improved, nausea vomiting resolved. Appetite  has returned - Cont Cefepime, Flagyl continue for intra-abdominal pathology. Will deescalate if continued improvement  - Advance diet today to clear liquid diet   AKI - Hx renal transplant, baseline Cr 0.83-1.0  - Likely due to dehydration in setting of IV contrast - Prograf, Prednisone  - IVF   NHL (non-Hodgkin's lymphoma)  - Treated in 2016 and was in remission - Appears to have a recurrence- will need a biopsy -  FNA of a lymph node would not give an optimal sample and an excisional biopsy is preferred - as the abdominal lympnode are difficult to obtain without a laparoscopy, will need a CT chest with contrast to determine a better site to be biopsied - Dr. Wynelle Cleveland spoke with Oncology, Dr Julien Nordmann, 6/18, who is recommending she f/u with her original oncologist for a work up for the possible recurrence and if she wants local care, she can be referred to the [place] center. Patient and family state that they would like to transfer care to [facility]. Will arrange at time of discharge.   Left heel pain - Xray negative    DVT prophylaxis: SCD Code Status: Partial, do not intubate  Family Communication: At bedside  Disposition Plan: pending improvement   Consultants:   None  Procedures:   None  Antimicrobials:  Anti-infectives    Start     Dose/Rate Route Frequency Ordered Stop   01/02/17 2200  ceFEPIme (MAXIPIME) 1 g in dextrose 5 % 50 mL IVPB     1 g 100 mL/hr over 30 Minutes  Intravenous Every 12 hours 01/02/17 1116     01/01/17 2200  ceFEPIme (MAXIPIME) 2 g in dextrose 5 % 50 mL IVPB  Status:  Discontinued     2 g 100 mL/hr over 30 Minutes Intravenous Every 12 hours 01/01/17 1247 01/02/17 1116   01/01/17 0600  vancomycin (VANCOCIN) 1,250 mg in sodium chloride 0.9 % 250 mL IVPB  Status:  Discontinued     1,250 mg 166.7 mL/hr over 90 Minutes Intravenous Every 24 hours 12/31/16 0524 12/31/16 1608   12/31/16 2200  ceFEPIme (MAXIPIME) 2 g in dextrose 5 % 50 mL  IVPB  Status:  Discontinued     2 g 100 mL/hr over 30 Minutes Intravenous Every 24 hours 12/31/16 0452 01/01/17 1247   12/31/16 1700  metroNIDAZOLE (FLAGYL) IVPB 500 mg     500 mg 100 mL/hr over 60 Minutes Intravenous Every 8 hours 12/31/16 1618     12/31/16 0530  vancomycin (VANCOCIN) IVPB 1000 mg/200 mL premix     1,000 mg 200 mL/hr over 60 Minutes Intravenous NOW 12/31/16 0523 12/31/16 0905   12/31/16 0115  vancomycin (VANCOCIN) IVPB 1000 mg/200 mL premix     1,000 mg 200 mL/hr over 60 Minutes Intravenous  Once 12/31/16 0107 12/31/16 0537   12/31/16 0115  ceFEPIme (MAXIPIME) 2 g in dextrose 5 % 50 mL IVPB     2 g 100 mL/hr over 30 Minutes Intravenous STAT 12/31/16 0114 12/31/16 0258       Subjective: Patient alert and awake this morning. Her mentation has returned back to baseline per family. She admits to some left-sided abdominal pain with palpation, but this is improved. Denies any nausea, vomiting. She states that she is hungry and thirsty and would like to advance diet today. No chest pain, no cough, no shortness of breath.  Objective: Vitals:   12/31/16 2002 01/01/17 0500 01/01/17 1955 01/02/17 0522  BP: (!) 147/77 (!) 142/90 (!) 147/80 (!) 136/91  Pulse: 93 (!) 109 89 84  Resp: 20 20  18   Temp: 98.1 F (36.7 C) 98.2 F (36.8 C) 98 F (36.7 C) 99.1 F (37.3 C)  TempSrc: Oral Oral Oral Oral  SpO2: 93% 93% 98% 95%  Weight:      Height:        Intake/Output Summary (Last 24 hours) at 01/02/17 1255 Last data filed at 01/02/17 1000  Gross per 24 hour  Intake          1585.83 ml  Output              585 ml  Net          1000.83 ml   Filed Weights   12/31/16 0619 12/31/16 1122  Weight: 106.6 kg (235 lb) 108 kg (238 lb 1.6 oz)    Examination:  General exam: Appears calm and comfortable  Respiratory system: Clear to auscultation. Respiratory effort normal. Cardiovascular system: S1 & S2 heard, RRR. No JVD, murmurs, rubs, gallops or clicks. No pedal  edema. Gastrointestinal system: Abdomen is nondistended, soft and TTP LUQ. No organomegaly or masses felt. Normal bowel sounds heard. Central nervous system: Alert and oriented. No focal neurological deficits. Extremities: Symmetric  Skin: No rashes, lesions or ulcers Psychiatry: Judgement and insight appear normal. Mood & affect appropriate.   Data Reviewed: I have personally reviewed following labs and imaging studies  CBC:  Recent Labs Lab 12/29/16 1930 12/31/16 0016 12/31/16 0628 01/01/17 0730 01/02/17 1127  WBC 6.5 8.7 8.3 8.7 6.8  NEUTROABS  --  5.8 5.4  --  5.5  HGB 13.6 13.8 12.4 11.4* 10.8*  HCT 41.7 43.4 37.3 36.3 34.5*  MCV 95.2 96.7 94.7 98.1 97.5  PLT 176 205 186 181 315*   Basic Metabolic Panel:  Recent Labs Lab 12/29/16 1930 12/31/16 0016 12/31/16 0017 12/31/16 0628 01/01/17 0730 01/02/17 0347 01/02/17 1127  NA 138 138  --  140 143  --  139  K 4.2 5.5*  --  5.0 5.0  --  4.0  CL 107 107  --  109 112*  --  111  CO2 24 24  --  25 24  --  19*  GLUCOSE 112* 147*  --  125* 134*  --  137*  BUN 12 21*  --  21* 22*  --  34*  CREATININE 1.06* 1.94*  --  1.56* 1.10* 1.53* 1.58*  CALCIUM 9.0 8.6*  --  8.3* 8.2*  --  8.1*  PHOS  --   --  2.7  --   --   --   --    GFR: Estimated Creatinine Clearance: 39.8 mL/min (A) (by C-G formula based on SCr of 1.58 mg/dL (H)). Liver Function Tests:  Recent Labs Lab 12/29/16 1930 12/31/16 0016 12/31/16 0628  AST 24 31 30   ALT 16 24 20   ALKPHOS 84 84 72  BILITOT 0.5 0.4 0.7  PROT 6.4* 7.3 6.2*  ALBUMIN 3.6 4.0 3.6    Recent Labs Lab 12/29/16 1930 12/31/16 0018  LIPASE 18 15    Recent Labs Lab 12/31/16 0816  AMMONIA 13   Coagulation Profile:  Recent Labs Lab 12/31/16 0018  INR 1.12   Cardiac Enzymes:  Recent Labs Lab 12/31/16 0017 12/31/16 0628 12/31/16 1155 12/31/16 1814  CKTOTAL 176  --   --   --   TROPONINI  --  0.28* 0.20* 0.17*   BNP (last 3 results) No results for input(s): PROBNP  in the last 8760 hours. HbA1C: No results for input(s): HGBA1C in the last 72 hours. CBG:  Recent Labs Lab 12/31/16 0815 12/31/16 2356 01/01/17 0740 01/02/17 0825  GLUCAP 136* 141* 162* 109*   Lipid Profile: No results for input(s): CHOL, HDL, LDLCALC, TRIG, CHOLHDL, LDLDIRECT in the last 72 hours. Thyroid Function Tests:  Recent Labs  12/31/16 0628  TSH 0.878   Anemia Panel: No results for input(s): VITAMINB12, FOLATE, FERRITIN, TIBC, IRON, RETICCTPCT in the last 72 hours. Sepsis Labs:  Recent Labs Lab 12/31/16 0030  LATICACIDVEN 2.58*    Recent Results (from the past 240 hour(s))  Blood culture (routine x 2)     Status: None (Preliminary result)   Collection Time: 12/31/16  1:23 AM  Result Value Ref Range Status   Specimen Description BLOOD BLOOD RIGHT FOREARM  Final   Special Requests   Final    BOTTLES DRAWN AEROBIC AND ANAEROBIC Blood Culture adequate volume   Culture   Final    NO GROWTH 1 DAY Performed at [facility], [address], [place], [place] [address]    Report Status PENDING  Incomplete  Blood culture (routine x 2)     Status: None (Preliminary result)   Collection Time: 12/31/16  1:29 AM  Result Value Ref Range Status   Specimen Description BLOOD L WRIST  Final   Special Requests IN PEDIATRIC BOTTLE Blood Culture adequate volume  Final   Culture   Final    NO GROWTH 1 DAY Performed at [facility], [place] [address], [place], [place]  [address]    Report Status PENDING  Incomplete       Radiology Studies: Mr Brain Wo Contrast  Result Date: 12/31/2016 CLINICAL DATA:  Altered mental status.  Acute encephalopathy EXAM: MRI HEAD WITHOUT CONTRAST TECHNIQUE: Multiplanar, multiecho pulse sequences of the brain and surrounding structures were obtained without intravenous contrast. COMPARISON:  CT head 12/31/2016 FINDINGS: Brain: Image quality degraded by motion. Ventricle size normal. Negative for acute or chronic infarction. Negative  for hemorrhage or fluid collection. Negative for mass or edema. Vascular: Normal arterial flow void Skull and upper cervical spine: Negative Sinuses/Orbits: Negative Other: None IMPRESSION: Negative MRI of the brain without contrast. Image quality degraded by motion. Electronically Signed   By: Franchot Gallo M.D.   On: 12/31/2016 13:57   Dg Foot 2 Views Left  Result Date: 01/01/2017 CLINICAL DATA:  Fall. EXAM: LEFT FOOT - 2 VIEW COMPARISON:  No prior available. FINDINGS: Diffuse degenerative change. No acute bony or joint abnormality. No evidence of fracture or dislocation. IMPRESSION: Diffuse degenerative change.  No acute abnormality . Electronically Signed   By: [place]   On: 01/01/2017 12:20      Scheduled Meds: . gi cocktail  30 mL Oral TID  . metoCLOPramide (REGLAN) injection  5 mg Intravenous Q6H  . ondansetron  4 mg Oral Q6H   Or  . ondansetron (ZOFRAN) IV  4 mg Intravenous Q6H  . [START ON 01/03/2017] predniSONE  10 mg Oral Q breakfast  . sodium chloride flush  10-40 mL Intracatheter Q12H  . tacrolimus  1.5 mg Oral BID   Continuous Infusions: . sodium chloride    . ceFEPime (MAXIPIME) IV    . metronidazole Stopped (01/02/17 1200)     LOS: 2 days    Time spent: 40 minutes   Dessa Phi, DO [facility] [email or web address] Password [facility] 01/02/2017, 12:55 PM

## 2017-01-02 NOTE — Progress Notes (Signed)
Pharmacy Antibiotic Note  Erica Woodward is a 71 y.o. female c/o bilateral leg pain admitted on 12/30/2016 with sepsis.  Pharmacy was consulted for cefepime/vancomycin dosing.  Plan:  Adjust Cefepime back down to 1 Gm IV q12h with new rise in SCr  F/u scr/cultures; will continue daily SCr given renal Txplant  Height: 5\' 4"  (162.6 cm) Weight: 238 lb 1.6 oz (108 kg) IBW/kg (Calculated) : 54.7  Temp (24hrs), Avg:98.6 F (37 C), Min:98 F (36.7 C), Max:99.1 F (37.3 C)   Recent Labs Lab 12/29/16 1930 12/31/16 0016 12/31/16 0030 12/31/16 0628 01/01/17 0730 01/02/17 0347  WBC 6.5 8.7  --  8.3 8.7  --   CREATININE 1.06* 1.94*  --  1.56* 1.10* 1.53*  LATICACIDVEN  --   --  2.58*  --   --   --     Estimated Creatinine Clearance: 41 mL/min (A) (by C-G formula based on SCr of 1.53 mg/dL (H)).    No Known Allergies  Antimicrobials this admission: 6/18 cefepime >>  6/18 vancomycin >> 6/18 6/18 Flagyl >>  Dose adjustments this admission: 6/19 Increase cefepime to 2g q24 for improved CrCl 6/20 reduce back to 1q12 for worsening CrCl  Microbiology results: 6/18 BCx: ng1d  Thank you for allowing pharmacy to be a part of this patient's care.  Reuel Boom, PharmD, BCPS Pager: [phone] 01/02/2017, 11:15 AM

## 2017-01-03 LAB — CBC
HEMATOCRIT: 32.8 % — AB (ref 36.0–46.0)
HEMOGLOBIN: 10.7 g/dL — AB (ref 12.0–15.0)
MCH: 30.9 pg (ref 26.0–34.0)
MCHC: 32.6 g/dL (ref 30.0–36.0)
MCV: 94.8 fL (ref 78.0–100.0)
PLATELETS: 131 10*3/uL — AB (ref 150–400)
RBC: 3.46 MIL/uL — ABNORMAL LOW (ref 3.87–5.11)
RDW: 14.4 % (ref 11.5–15.5)
WBC: 6.3 10*3/uL (ref 4.0–10.5)

## 2017-01-03 LAB — BASIC METABOLIC PANEL
Anion gap: 7 (ref 5–15)
BUN: 28 mg/dL — AB (ref 6–20)
CO2: 23 mmol/L (ref 22–32)
CREATININE: 1.27 mg/dL — AB (ref 0.44–1.00)
Calcium: 8.2 mg/dL — ABNORMAL LOW (ref 8.9–10.3)
Chloride: 111 mmol/L (ref 101–111)
GFR calc Af Amer: 48 mL/min — ABNORMAL LOW (ref >60)
GFR, EST NON AFRICAN AMERICAN: 42 mL/min — AB (ref >60)
GLUCOSE: 95 mg/dL (ref 65–99)
Potassium: 3.7 mmol/L (ref 3.5–5.1)
SODIUM: 141 mmol/L (ref 135–145)

## 2017-01-03 MED ORDER — ACETAMINOPHEN 500 MG PO TABS: 500.0000 mg | ORAL_TABLET | Freq: Once | ORAL | Status: DC

## 2017-01-03 MED ORDER — SALINE SPRAY 0.65 % NA SOLN
1.0000 | NASAL | Status: DC | PRN
  Administered 2017-01-03: 1 via NASAL
  Filled 2017-01-03 (×2): qty 44

## 2017-01-03 MED ORDER — POLYVINYL ALCOHOL 1.4 % OP SOLN
1.0000 [drp] | OPHTHALMIC | Status: DC | PRN
  Administered 2017-01-03: 1 [drp] via OPHTHALMIC
  Filled 2017-01-03 (×2): qty 15

## 2017-01-03 MED ORDER — ONDANSETRON HCL 4 MG PO TABS: 4.0000 mg | ORAL_TABLET | Freq: Four times a day (QID) | ORAL | Status: DC | PRN

## 2017-01-03 MED ORDER — ONDANSETRON HCL 4 MG/2ML IJ SOLN: 4.0000 mg | Freq: Four times a day (QID) | INTRAMUSCULAR | Status: DC | PRN

## 2017-01-03 MED ORDER — SUMATRIPTAN SUCCINATE 25 MG PO TABS
25.0000 mg | ORAL_TABLET | ORAL | Status: DC | PRN
  Administered 2017-01-03: 25 mg via ORAL
  Filled 2017-01-03: qty 1

## 2017-01-03 NOTE — Progress Notes (Signed)
Pt states that she would like RW for balance. There are no HH needs at present time.

## 2017-01-03 NOTE — Progress Notes (Signed)
IV removed PICC. Dressing clean, dry, intact at time of discharge. Denies pain or discomfort. Discharged with all belongings. DC teaching completed using teachback method. Discharge teach done with nurse, daughter and pt. Both verbalized understanding.

## 2017-01-03 NOTE — Discharge Summary (Signed)
Physician Discharge Summary  Erica Woodward ITG:[id number] DOB: March 19, 1946 DOA: 12/30/2016  PCP: Susy Frizzle, MD  Admit date: 12/30/2016 Discharge date: 01/03/2017  Admitted From: Home Disposition:  Home  Recommendations for Outpatient Follow-up:  1. Follow up with PCP in 1 week 2. Follow up with oncology. Patient is requesting to transfer care to [facility]. This has been arranged; their office will call patient after discharge to arrange. 3. Please obtain BMP in 1 week   Discharge Condition: Stable CODE STATUS: Partial, do not intubate  Diet recommendation: Heart healthy   Brief/Interim Summary: Erica Sutphinis a 71 y.o.femalewith history of high-grade B-cell lymphoma treated in 2016 at [facility], kidney transplant on immunosuppressants, hyperlipidemia who was in the ER on 6/16 for nausea and underwent a CT of the abdomen pelvis. This showed lymphadenopathy consistent with possible cancer recurrence. She was discharged home and soon afterwards, began to have vomiting which was difficult to control. She subsequently developed "pain" in her left heel and in her abdomen. The family states that she was screaming in pain when she tried to walk and was behaving like she was disoriented. Her daughter decided to give her Hydrocodone for her pain and then decided to bring her back into the ER. In the ER the patient was noted to be quite sleepy and oriented only to self. She was noted to have tenderness and erythema in her left heel.   Patient was treated with IV fluids. CT head was unrevealing for etiology for acute encephalopathy. Her mentation returned back to baseline with supportive care. She was initially treated for abdominal pain with IV antibiotics as well as underwent CT abdomen pelvis which showed lymphadenopathy with no other etiology for pain. Due to acute kidney injury and history of renal transplant, contrasted CT was not pursued. Her abdominal pain, nausea,  vomiting resolved and patient was tolerating meals prior to discharge. Patient and family elected to transfer her care for her non-Hodgkin's lymphoma to [facility]. This has been arranged and the office will call patient after discharge to arrange follow up appointment.  Discharge Diagnoses:  Principal Problem:   Acute encephalopathy Active Problems:   HLD (hyperlipidemia)   GOUT   Essential hypertension   KIDNEY TRANSPLANTATION, HX OF   NHL (non-Hodgkin's lymphoma) (HCC)   ARF (acute renal failure) (HCC)   Acute encephalopathy - CT head without contrast unrevealing - Suspect dehydration, inability to rest properlydue to vomiting for >24 hrs and medications including Hydrocodone and Neurontin in the setting of AKI may be causing her mental status change - Resolved back to baseline   Abdominal pain, nausea and vomiting - She underwent a CT abd/ pelvis on 6/18 withoutoral and with IV contrast which showed lymphadenopathy and no other etiology for pain - Could consider repeat CT with contrast, however, due to impaired renal function, will hold off. Pain has improved, nausea vomiting resolved. Appetite has returned - Advance diet with no recurrence in symptoms. Resolved. Suspect probably viral in etiology. Stop antibiotics.   AKI - Hx renal transplant, baseline Cr 0.83-1.0  - Prograf, Prednisone  - Improved and stable, repeat BMP as outpatient   NHL (non-Hodgkin's lymphoma)  - Treated in 2016 and was in remission - Appears to have a recurrence- will need a biopsy - FNA of a lymph node would not give an optimal sample and an excisional biopsy is preferred - as the abdominal lympnode are difficult to obtain without a laparoscopy, will need a CT chest  with contrast to determine a bettersite to be biopsied - Dr. Wynelle Cleveland spokewith Oncology, Dr Julien Nordmann, 6/18, who is recommending she f/u with her original oncologist for a work up for the possible recurrence and if she  wants local care, she can be referred to the [place] center. Patient and family state that they would like to transfer care to [facility]. Will arrange at time of discharge.   Left heel pain - Xray negative, improved, ambulating without issue    Discharge Instructions  Discharge Instructions    Call MD for:  difficulty breathing, headache or visual disturbances    Complete by:  As directed    Call MD for:  extreme fatigue    Complete by:  As directed    Call MD for:  persistant dizziness or light-headedness    Complete by:  As directed    Call MD for:  persistant nausea and vomiting    Complete by:  As directed    Call MD for:  severe uncontrolled pain    Complete by:  As directed    Call MD for:  temperature >100.4    Complete by:  As directed    Diet - low sodium heart healthy    Complete by:  As directed    Discharge instructions    Complete by:  As directed    You were cared for by a hospitalist during your hospital stay. If you have any questions about your discharge medications or the care you received while you were in the hospital after you are discharged, you can call the unit and asked to speak with the hospitalist on call if the hospitalist that took care of you is not available. Once you are discharged, your primary care physician will handle any further medical issues. Please note that NO REFILLS for any discharge medications will be authorized once you are discharged, as it is imperative that you return to your primary care physician (or establish a relationship with a primary care physician if you do not have one) for your aftercare needs so that they can reassess your need for medications and monitor your lab values.   Increase activity slowly    Complete by:  As directed      Allergies as of 01/03/2017   No Known Allergies     Medication List    TAKE these medications   aspirin EC 81 MG tablet Take 81 mg by mouth daily.   atorvastatin 40 MG  tablet Commonly known as:  LIPITOR TAKE 1 TABLET (40 MG TOTAL) BY MOUTH DAILY.   cetirizine 10 MG tablet Commonly known as:  ZYRTEC Take 10 mg by mouth at bedtime.   [employer] SENNA PLUS 8.6-50 MG tablet Generic drug:  senna-docusate Take 1 tablet by mouth daily.   esomeprazole 40 MG capsule Commonly known as:  NEXIUM Take 1 capsule (40 mg total) by mouth daily.   gabapentin 300 MG capsule Commonly known as:  NEURONTIN Take 1 capsule (300 mg total) by mouth 3 (three) times daily. What changed:  how much to take  when to take this   HYDROcodone-acetaminophen 5-325 MG tablet Commonly known as:  NORCO/VICODIN Take 1-2 tablets by mouth every 4 (four) hours as needed.   magnesium oxide 400 MG tablet Commonly known as:  MAG-OX Take 400 mg by mouth 2 (two) times daily.   ondansetron 4 MG disintegrating tablet Commonly known as:  ZOFRAN ODT Take 1 tablet (4 mg total) by mouth every 8 (  eight) hours as needed for nausea or vomiting.   predniSONE 10 MG tablet Commonly known as:  DELTASONE Take 10 mg by mouth daily with breakfast.   tacrolimus 0.5 MG capsule Commonly known as:  PROGRAF Take 1.5 mg by mouth 2 (two) times daily. 3 capsules (1.5mg  bid) in the morning and at night. Verified with family 6/18            Durable Medical Equipment        Start     Ordered   01/03/17 1207  For home use only DME Walker rolling  Once    Question:  Patient needs a walker to treat with the following condition  Answer:  Balance problem   01/03/17 1207     Follow-up Information    Susy Frizzle, MD. Schedule an appointment as soon as possible for a visit in 1 week(s).   Specialty:  Family Medicine Contact information: [address] [place] [address] [phone]          No Known Allergies  Consultations:  None   Procedures/Studies: Ct Head Wo Contrast  Result Date: 12/31/2016 CLINICAL DATA:  Altered mental status.  Lethargy. EXAM: CT HEAD WITHOUT  CONTRAST TECHNIQUE: Contiguous axial images were obtained from the base of the skull through the vertex without intravenous contrast. COMPARISON:  Face CT 07/31/2008 FINDINGS: Brain: No evidence of acute infarction, hemorrhage, hydrocephalus, extra-axial collection or mass lesion/mass effect. Vascular: Atherosclerosis of skullbase vasculature without hyperdense vessel or abnormal calcification. Skull: No fracture.  No focal lesion. Sinuses/Orbits: Mastoid air cells are underpneumatized, chronic. Improved paranasal sinus inflammation from prior exam, minimal residual mucosal thickening of posterior ethmoid air cells. Visualized orbits are unremarkable. Other: None. IMPRESSION: No acute intracranial abnormality. Electronically Signed   By: Jeb Levering M.D.   On: 12/31/2016 01:16   Mr Brain Wo Contrast  Result Date: 12/31/2016 CLINICAL DATA:  Altered mental status.  Acute encephalopathy EXAM: MRI HEAD WITHOUT CONTRAST TECHNIQUE: Multiplanar, multiecho pulse sequences of the brain and surrounding structures were obtained without intravenous contrast. COMPARISON:  CT head 12/31/2016 FINDINGS: Brain: Image quality degraded by motion. Ventricle size normal. Negative for acute or chronic infarction. Negative for hemorrhage or fluid collection. Negative for mass or edema. Vascular: Normal arterial flow void Skull and upper cervical spine: Negative Sinuses/Orbits: Negative Other: None IMPRESSION: Negative MRI of the brain without contrast. Image quality degraded by motion. Electronically Signed   By: Franchot Gallo M.D.   On: 12/31/2016 13:57   Ct Abdomen Pelvis W Contrast  Result Date: 12/30/2016 CLINICAL DATA:  Left lower quadrant abdominal pain radiating to the back EXAM: CT ABDOMEN AND PELVIS WITH CONTRAST TECHNIQUE: Multidetector CT imaging of the abdomen and pelvis was performed using the standard protocol following bolus administration of intravenous contrast. CONTRAST:  100 mL Isovue 300 intravenous  COMPARISON:  Radiograph 06/29/2014, CT 06/28/2014 FINDINGS: Lower chest: Lung bases demonstrate no acute consolidation or pleural effusion. Mild coronary artery calcification. Small distal esophageal hiatal hernia. Hepatobiliary: Stable cyst in the left hepatic lobe. No calcified gallstones. No biliary dilatation. Pancreas: Unremarkable. No pancreatic ductal dilatation or surrounding inflammatory changes. Spleen: Normal in size without focal abnormality. Adrenals/Urinary Tract: Adrenal glands are within normal limits. Atrophic native kidneys with no hydronephrosis. Unremarkable appearing right lower quadrant renal transplant. Bladder normal Stomach/Bowel: The stomach is nonenlarged. No dilated small bowel. No colon wall thickening. Vascular/Lymphatic: Aortic atherosclerosis. No aneurysmal dilatation. Multiple enlarged retroperitoneal lymph nodes. These measure up to 1.7 cm in  diameter. Small nodules are nodes anterior to the aortic bifurcation. Lymph nodes adjacent to the left common iliac vessel. Left pelvic sidewall adenopathy measuring up to 1.7 cm. Presacral nodule measuring 1.8 cm with adjacent smaller presacral nodules noted. Reproductive: Uterus unremarkable.  3 cm right ovarian cyst Other: No free air or free fluid Musculoskeletal: Grade 1 anterolisthesis of L4 on L5. No acute or suspicious bone lesion IMPRESSION: 1. No acute abnormality is visualized 2. Interim development of retroperitoneal, iliac chain, and left pelvic sidewall adenopathy. Multiple soft tissue nodules in the presacral space. Findings are concerning for neoplasm, possible metastatic disease or lymphoma. Correlation with PET-CT is suggested. 3. 3 cm right ovarian cyst. Electronically Signed   By: Donavan Foil M.D.   On: 12/30/2016 03:13   Dg Chest Port 1 View  Result Date: 12/31/2016 CLINICAL DATA:  29-year-old female with altered mental status. EXAM: PORTABLE CHEST 1 VIEW COMPARISON:  Chest CT dated 06/16/2014 FINDINGS: Minimal  bibasilar atelectatic changes. No focal consolidation, pleural effusion, or pneumothorax. Top-normal cardiac size. Mild atherosclerotic calcification of the aortic arch. No acute osseous pathology. IMPRESSION: No active disease. Electronically Signed   By: Anner Crete M.D.   On: 12/31/2016 00:20   Dg Foot 2 Views Left  Result Date: 01/01/2017 CLINICAL DATA:  Fall. EXAM: LEFT FOOT - 2 VIEW COMPARISON:  No prior available. FINDINGS: Diffuse degenerative change. No acute bony or joint abnormality. No evidence of fracture or dislocation. IMPRESSION: Diffuse degenerative change.  No acute abnormality . Electronically Signed   By: Marcello Moores  Register   On: 01/01/2017 12:20       Discharge Exam: Vitals:   01/02/17 2020 01/03/17 0419  BP: (!) 128/44 (!) 133/49  Pulse: 81 77  Resp:  18  Temp: 97.7 F (36.5 C) 97.6 F (36.4 C)   Vitals:   01/02/17 0522 01/02/17 1444 01/02/17 2020 01/03/17 0419  BP: (!) 136/91 (!) 156/85 (!) 128/44 (!) 133/49  Pulse: 84 76 81 77  Resp: 18   18  Temp: 99.1 F (37.3 C) 98.4 F (36.9 C) 97.7 F (36.5 C) 97.6 F (36.4 C)  TempSrc: Oral Oral Oral Oral  SpO2: 95% 97% 100% 95%  Weight:      Height:        General: Pt is alert, awake, not in acute distress Cardiovascular: RRR, S1/S2 +, no rubs, no gallops Respiratory: CTA bilaterally, no wheezing, no rhonchi Abdominal: Soft, NT, ND, bowel sounds + Extremities: no edema, no cyanosis    The results of significant diagnostics from this hospitalization (including imaging, microbiology, ancillary and laboratory) are listed below for reference.     Microbiology: Recent Results (from the past 240 hour(s))  Blood culture (routine x 2)     Status: None (Preliminary result)   Collection Time: 12/31/16  1:23 AM  Result Value Ref Range Status   Specimen Description BLOOD BLOOD RIGHT FOREARM  Final   Special Requests   Final    BOTTLES DRAWN AEROBIC AND ANAEROBIC Blood Culture adequate volume   Culture    Final    NO GROWTH 2 DAYS Performed at [facility], [address] [place], [place] [address]    Report Status PENDING  Incomplete  Blood culture (routine x 2)     Status: None (Preliminary result)   Collection Time: 12/31/16  1:29 AM  Result Value Ref Range Status   Specimen Description BLOOD L WRIST  Final   Special Requests IN PEDIATRIC BOTTLE Blood Culture adequate volume  Final   Culture   Final    NO GROWTH 2 DAYS Performed at [facility], [place] [address] [place], [place] [address]    Report Status PENDING  Incomplete     Labs: BNP (last 3 results) No results for input(s): BNP in the last 8760 hours. Basic Metabolic Panel:  Recent Labs Lab 12/31/16 0016 12/31/16 0017 12/31/16 0628 01/01/17 0730 01/02/17 0347 01/02/17 1127 01/03/17 0304  NA 138  --  140 143  --  139 141  K 5.5*  --  5.0 5.0  --  4.0 3.7  CL 107  --  109 112*  --  111 111  CO2 24  --  25 24  --  19* 23  GLUCOSE 147*  --  125* 134*  --  137* 95  BUN 21*  --  21* 22*  --  34* 28*  CREATININE 1.94*  --  1.56* 1.10* 1.53* 1.58* 1.27*  CALCIUM 8.6*  --  8.3* 8.2*  --  8.1* 8.2*  PHOS  --  2.7  --   --   --   --   --    Liver Function Tests:  Recent Labs Lab 12/29/16 1930 12/31/16 0016 12/31/16 0628  AST 24 31 30   ALT 16 24 20   ALKPHOS 84 84 72  BILITOT 0.5 0.4 0.7  PROT 6.4* 7.3 6.2*  ALBUMIN 3.6 4.0 3.6    Recent Labs Lab 12/29/16 1930 12/31/16 0018  LIPASE 18 15    Recent Labs Lab 12/31/16 0816  AMMONIA 13   CBC:  Recent Labs Lab 12/31/16 0016 12/31/16 0628 01/01/17 0730 01/02/17 1127 01/03/17 0304  WBC 8.7 8.3 8.7 6.8 6.3  NEUTROABS 5.8 5.4  --  5.5  --   HGB 13.8 12.4 11.4* 10.8* 10.7*  HCT 43.4 37.3 36.3 34.5* 32.8*  MCV 96.7 94.7 98.1 97.5 94.8  PLT 205 186 181 135* 131*   Cardiac Enzymes:  Recent Labs Lab 12/31/16 0017 12/31/16 0628 12/31/16 1155 12/31/16 1814  CKTOTAL 176  --   --   --   TROPONINI  --  0.28* 0.20* 0.17*    BNP: Invalid input(s): POCBNP CBG:  Recent Labs Lab 01/02/17 0825 01/02/17 1359 01/02/17 1730 01/02/17 2013 01/02/17 2327  GLUCAP 109* 120* 134* 127* 130*   D-Dimer No results for input(s): DDIMER in the last 72 hours. Hgb A1c No results for input(s): HGBA1C in the last 72 hours. Lipid Profile No results for input(s): CHOL, HDL, LDLCALC, TRIG, CHOLHDL, LDLDIRECT in the last 72 hours. Thyroid function studies No results for input(s): TSH, T4TOTAL, T3FREE, THYROIDAB in the last 72 hours.  Invalid input(s): FREET3 Anemia work up No results for input(s): VITAMINB12, FOLATE, FERRITIN, TIBC, IRON, RETICCTPCT in the last 72 hours. Urinalysis    Component Value Date/Time   COLORURINE YELLOW 12/31/2016 0143   APPEARANCEUR CLOUDY (A) 12/31/2016 0143   APPEARANCEUR Clear 06/28/2014 1754   LABSPEC 1.020 12/31/2016 0143   LABSPEC 1.032 06/28/2014 1754   PHURINE 5.0 12/31/2016 0143   GLUCOSEU NEGATIVE 12/31/2016 0143   GLUCOSEU Negative 06/28/2014 1754   HGBUR NEGATIVE 12/31/2016 0143   BILIRUBINUR NEGATIVE 12/31/2016 0143   BILIRUBINUR 1+ 06/28/2014 1754   KETONESUR NEGATIVE 12/31/2016 0143   PROTEINUR NEGATIVE 12/31/2016 0143   UROBILINOGEN 2.0 (H) 07/08/2014 2226   NITRITE NEGATIVE 12/31/2016 0143   LEUKOCYTESUR NEGATIVE 12/31/2016 0143   LEUKOCYTESUR Negative 06/28/2014 1754   Sepsis Labs Invalid input(s): PROCALCITONIN,  WBC,  LACTICIDVEN Microbiology Recent  Results (from the past 240 hour(s))  Blood culture (routine x 2)     Status: None (Preliminary result)   Collection Time: 12/31/16  1:23 AM  Result Value Ref Range Status   Specimen Description BLOOD BLOOD RIGHT FOREARM  Final   Special Requests   Final    BOTTLES DRAWN AEROBIC AND ANAEROBIC Blood Culture adequate volume   Culture   Final    NO GROWTH 2 DAYS Performed at [facility], [address], [place], [place] [address]    Report Status PENDING  Incomplete  Blood culture (routine x 2)      Status: None (Preliminary result)   Collection Time: 12/31/16  1:29 AM  Result Value Ref Range Status   Specimen Description BLOOD L WRIST  Final   Special Requests IN PEDIATRIC BOTTLE Blood Culture adequate volume  Final   Culture   Final    NO GROWTH 2 DAYS Performed at [facility], [place] [address], [place], [place] [address]    Report Status PENDING  Incomplete     Time coordinating discharge: 40 minutes  SIGNED:  Dessa Phi, DO [facility] Pager [phone]  If 7PM-7AM, please contact night-coverage [email or web address] Password [email or web address] 01/03/2017, 1:55 PM

## 2017-01-03 NOTE — Evaluation (Signed)
Physical Therapy Evaluation Patient Details Name: Erica Woodward MRN: [id number] DOB: May 27, 1946 Today's Date: 01/03/2017   History of Present Illness  This 71 year old female was admitted with lethargy and confusion.  She has a h/o NHL and kidney transplant and has a possible recurrence.   Pt was in the ER 2 days prior to admission with abdominal pain  MRI negative and Foot xray showed no acute abnormality  Clinical Impression  Patient presents close to functional baseline.  She should progress once d/c home with family support back to normal activity.  Currently awaiting d/c home so no further acute PT needs.  Will sign off.             Follow Up Recommendations No PT follow up    Equipment Recommendations  None recommended by PT    Recommendations for Other Services       Precautions / Restrictions Precautions Precautions: Fall Restrictions Weight Bearing Restrictions: No      Mobility  Bed Mobility Overal bed mobility: Independent                Transfers Overall transfer level: Needs assistance Equipment used: None Transfers: Sit to/from Stand Sit to Stand: Supervision         General transfer comment: assist for IV, safety  Ambulation/Gait Ambulation/Gait assistance: Supervision;Min guard Ambulation Distance (Feet): 200 Feet   Gait Pattern/deviations: Step-through pattern;Decreased stride length;Wide base of support;Trendelenburg     General Gait Details: increased lateral lean bilaterally posssibly due to hip strength, no LOB noted even turning head in hallway appropriate for environmental scanning  Stairs            Wheelchair Mobility    Modified Rankin (Stroke Patients Only)       Balance Overall balance assessment: Needs assistance   Sitting balance-Leahy Scale: Good       Standing balance-Leahy Scale: Good Standing balance comment: no UE support for balance, able to take some challenges                              Pertinent Vitals/Pain Pain Assessment: No/denies pain    Home Living Family/patient expects to be discharged to:: Private residence Living Arrangements: Children Available Help at Discharge: Family Type of Home: House Home Access: Stairs to enter Entrance Stairs-Rails: Left;Can reach Advertising account executive of Steps: 4 Home Layout: One level Home Equipment: Shower seat - built in Additional Comments: pt states daughter's shower is walk in with built in seat. Per pt, daughter works in the day, but pt's ex husband is in the home    Prior Function Level of Independence: Independent               Hand Dominance        Extremity/Trunk Assessment   Upper Extremity Assessment Upper Extremity Assessment: Defer to OT evaluation    Lower Extremity Assessment Lower Extremity Assessment: Overall WFL for tasks assessed       Communication   Communication: No difficulties  Cognition Arousal/Alertness: Awake/alert Behavior During Therapy: WFL for tasks assessed/performed Overall Cognitive Status: No family/caregiver present to determine baseline cognitive functioning                                 General Comments: WFLs for ADL tasks.  Unable to verify home info.      General Comments General comments (skin  integrity, edema, etc.): notes errythema and some edema L UE (pt reports present since PICC line placement), eccymosis on wrist    Exercises     Assessment/Plan    PT Assessment Patent does not need any further PT services  PT Problem List         PT Treatment Interventions      PT Goals (Current goals can be found in the Care Plan section)  Acute Rehab PT Goals PT Goal Formulation: All assessment and education complete, DC therapy    Frequency     Barriers to discharge        Co-evaluation               AM-PAC PT "6 Clicks" Daily Activity  Outcome Measure Difficulty turning over in bed (including adjusting  bedclothes, sheets and blankets)?: None Difficulty moving from lying on back to sitting on the side of the bed? : None Difficulty sitting down on and standing up from a chair with arms (e.g., wheelchair, bedside commode, etc,.)?: A Little Help needed moving to and from a bed to chair (including a wheelchair)?: A Little Help needed walking in hospital room?: None Help needed climbing 3-5 steps with a railing? : A Little 6 Click Score: 21    End of Session Equipment Utilized During Treatment: Gait belt Activity Tolerance: Patient tolerated treatment well Patient left: in chair;with call bell/phone within reach;with chair alarm set   PT Visit Diagnosis: Other abnormalities of gait and mobility (R26.89)    Time: 5537-4827 PT Time Calculation (min) (ACUTE ONLY): 12 min   Charges:   PT Evaluation $PT Eval Low Complexity: 1 Procedure     PT G CodesMagda Kiel, Kiefer 01/03/2017   Reginia Naas 01/03/2017, 11:03 AM

## 2017-01-03 NOTE — Evaluation (Signed)
Occupational Therapy Evaluation Patient Details Name: Erica Woodward MRN: [id number] DOB: [birth year] Today's Date: 01/03/2017    History of Present Illness This 71 year old female was admitted with lethargy and confusion.  She has a h/o NHL and kidney transplant and has a possible recurrence.   Pt was in the ER 2 days prior to admission with abdominal pain  MRI negative and Foot xray showed no acute abnormality   Clinical Impression   Pt was admitted for the above.  Pt overall needs set up/supervision to min guard for safety for adls/bathroom transfers.  Recommend initial 24/7 to make sure that pt is back to baseline cognition; wfls during OT assessment.      Follow Up Recommendations  No OT follow up    Equipment Recommendations  None recommended by OT    Recommendations for Other Services       Precautions / Restrictions Precautions Precautions: Fall Restrictions Weight Bearing Restrictions: No      Mobility Bed Mobility Overal bed mobility: Independent                Transfers Overall transfer level: Needs assistance Equipment used: None Transfers: Sit to/from Stand Sit to Stand: Supervision         General transfer comment: assist for IV, safety    Balance Overall balance assessment: Needs assistance   Sitting balance-Leahy Scale: Good       Standing balance-Leahy Scale: Good Standing balance comment: no UE support for balance, able to take some challenges                           ADL either performed or assessed with clinical judgement   ADL Overall ADL's : Needs assistance/impaired     Grooming: Oral care;Brushing hair;Standing   Upper Body Bathing: Set up;Sitting   Lower Body Bathing: Min guard;Sit to/from stand   Upper Body Dressing : Set up;Sitting   Lower Body Dressing: Min guard;Sit to/from stand   Toilet Transfer: Min guard;Ambulation;RW;Comfort height toilet             General ADL Comments: educated on energy  conservation.  Min guard for safety when walking. Pt tends to sway/increased weight shift but no LOB     Vision         Perception     Praxis      Pertinent Vitals/Pain Pain Assessment: No/denies pain     Hand Dominance     Extremity/Trunk Assessment Upper Extremity Assessment Upper Extremity Assessment: WFLs          Communication Communication Communication: No difficulties   Cognition Arousal/Alertness: Awake/alert Behavior During Therapy: WFL for tasks assessed/performed Overall Cognitive Status: No family/caregiver present to determine baseline cognitive functioning                                 General Comments: WFLs for ADL tasks.  Unable to verify home info.   General Comments  notes errythema and some edema L UE (pt reports present since PICC line placement), eccymosis on wrist    Exercises     Shoulder Instructions      Home Living Family/patient expects to be discharged to:: Private residence Living Arrangements: Children Available Help at Discharge: Family Type of Home: House Home Access: Stairs to enter Technical brewer of Steps: 4 Entrance Stairs-Rails: Left;Can reach both;Right Home Layout: One level     Bathroom  Shower/Tub: Tub/shower unit;Walk-in shower   Bathroom Toilet: Standard     Home Equipment: Shower seat - built in   Additional Comments: pt states daughter's shower is walk in with built in seat. Per pt, daughter works in the day, but pt's ex husband is in the home      Prior Functioning/Environment Level of Independence: Independent                 OT Problem List:        OT Treatment/Interventions:      OT Goals(Current goals can be found in the care plan section) Acute Rehab OT Goals Patient Stated Goal: home today OT Goal Formulation: All assessment and education complete, DC therapy  OT Frequency:     Barriers to D/C:            Co-evaluation              AM-PAC PT "6  Clicks" Daily Activity     Outcome Measure Help from another person eating meals?: None Help from another person taking care of personal grooming?: A Little Help from another person toileting, which includes using toliet, bedpan, or urinal?: A Little Help from another person bathing (including washing, rinsing, drying)?: A Little Help from another person to put on and taking off regular upper body clothing?: None Help from another person to put on and taking off regular lower body clothing?: None 6 Click Score: 21   End of Session    Activity Tolerance: Patient tolerated treatment well Patient left: in bed;with call bell/phone within reach  OT Visit Diagnosis: Muscle weakness (generalized) (M62.81)                Time: 7425-9563 OT Time Calculation (min): 12 min Charges:  OT General Charges $OT Visit: 1 Procedure OT Evaluation $OT Eval Low Complexity: 1 Procedure G-Codes:     Lorain, OTR/L [phone] 01/03/2017  Dreyden Rohrman 01/03/2017, 11:17 AM

## 2017-01-05 ENCOUNTER — Telehealth: Payer: Self-pay | Admitting: Hematology and Oncology

## 2017-01-05 LAB — CULTURE, BLOOD (ROUTINE X 2)
CULTURE: NO GROWTH
CULTURE: NO GROWTH
SPECIAL REQUESTS: ADEQUATE
SPECIAL REQUESTS: ADEQUATE

## 2017-01-05 NOTE — Telephone Encounter (Signed)
Spoke with patient dtr re 6/26 new patient appointment with Dr. Lebron Conners at 2 pm to arrive 1:30 pm. Demographic and insurance information confirmed. Records in EPIC.

## 2017-01-08 ENCOUNTER — Ambulatory Visit (HOSPITAL_BASED_OUTPATIENT_CLINIC_OR_DEPARTMENT_OTHER): Payer: Medicare HMO

## 2017-01-08 ENCOUNTER — Encounter: Payer: Self-pay | Admitting: Hematology and Oncology

## 2017-01-08 ENCOUNTER — Ambulatory Visit (HOSPITAL_BASED_OUTPATIENT_CLINIC_OR_DEPARTMENT_OTHER): Payer: Medicare HMO | Admitting: Hematology and Oncology

## 2017-01-08 ENCOUNTER — Ambulatory Visit (HOSPITAL_COMMUNITY)
Admission: RE | Admit: 2017-01-08 | Discharge: 2017-01-08 | Disposition: A | Discharge status: Home / Self Care | Payer: Medicare HMO | Source: Ambulatory Visit | Attending: Hematology and Oncology | Admitting: Hematology and Oncology

## 2017-01-08 VITALS — BP 126/66 | HR 95 | Temp 98.8°F | Resp 18 | Ht 64.0 in | Wt 237.4 lb

## 2017-01-08 DIAGNOSIS — I700 Atherosclerosis of aorta: Secondary | ICD-10-CM | POA: Diagnosis not present

## 2017-01-08 DIAGNOSIS — R599 Enlarged lymph nodes, unspecified: Secondary | ICD-10-CM | POA: Diagnosis not present

## 2017-01-08 DIAGNOSIS — Z803 Family history of malignant neoplasm of breast: Secondary | ICD-10-CM

## 2017-01-08 DIAGNOSIS — R05 Cough: Secondary | ICD-10-CM

## 2017-01-08 DIAGNOSIS — Z8572 Personal history of non-Hodgkin lymphomas: Secondary | ICD-10-CM

## 2017-01-08 DIAGNOSIS — R601 Generalized edema: Secondary | ICD-10-CM

## 2017-01-08 DIAGNOSIS — R0602 Shortness of breath: Secondary | ICD-10-CM | POA: Insufficient documentation

## 2017-01-08 DIAGNOSIS — R609 Edema, unspecified: Secondary | ICD-10-CM | POA: Diagnosis not present

## 2017-01-08 DIAGNOSIS — Z940 Kidney transplant status: Secondary | ICD-10-CM | POA: Diagnosis not present

## 2017-01-08 DIAGNOSIS — R0609 Other forms of dyspnea: Secondary | ICD-10-CM

## 2017-01-08 DIAGNOSIS — R918 Other nonspecific abnormal finding of lung field: Secondary | ICD-10-CM | POA: Diagnosis not present

## 2017-01-08 DIAGNOSIS — C8593 Non-Hodgkin lymphoma, unspecified, intra-abdominal lymph nodes: Secondary | ICD-10-CM

## 2017-01-08 DIAGNOSIS — I2510 Atherosclerotic heart disease of native coronary artery without angina pectoris: Secondary | ICD-10-CM | POA: Diagnosis not present

## 2017-01-08 DIAGNOSIS — Z800 Family history of malignant neoplasm of digestive organs: Secondary | ICD-10-CM

## 2017-01-08 LAB — COMPREHENSIVE METABOLIC PANEL
ALT: 100 U/L — AB (ref 0–55)
AST: 20 U/L (ref 5–34)
Albumin: 3 g/dL — ABNORMAL LOW (ref 3.5–5.0)
Alkaline Phosphatase: 71 U/L (ref 40–150)
Anion Gap: 8 mEq/L (ref 3–11)
BILIRUBIN TOTAL: 1.13 mg/dL (ref 0.20–1.20)
BUN: 8.3 mg/dL (ref 7.0–26.0)
CALCIUM: 8.6 mg/dL (ref 8.4–10.4)
CHLORIDE: 109 meq/L (ref 98–109)
CO2: 24 meq/L (ref 22–29)
CREATININE: 0.9 mg/dL (ref 0.6–1.1)
EGFR: 69 mL/min/{1.73_m2} — ABNORMAL LOW (ref >90)
Glucose: 106 mg/dl (ref 70–140)
Potassium: 4.3 mEq/L (ref 3.5–5.1)
Sodium: 141 mEq/L (ref 136–145)
TOTAL PROTEIN: 5.6 g/dL — AB (ref 6.4–8.3)

## 2017-01-08 LAB — CBC & DIFF AND RETIC
BASO%: 0.2 % (ref 0.0–2.0)
Basophils Absolute: 0 10*3/uL (ref 0.0–0.1)
EOS ABS: 0.2 10*3/uL (ref 0.0–0.5)
EOS%: 3.3 % (ref 0.0–7.0)
HCT: 37.6 % (ref 34.8–46.6)
HGB: 12 g/dL (ref 11.6–15.9)
IMMATURE RETIC FRACT: 10.8 % — AB (ref 1.60–10.00)
LYMPH%: 17.8 % (ref 14.0–49.7)
MCH: 31.1 pg (ref 25.1–34.0)
MCHC: 31.9 g/dL (ref 31.5–36.0)
MCV: 97.4 fL (ref 79.5–101.0)
MONO#: 1 10*3/uL — ABNORMAL HIGH (ref 0.1–0.9)
MONO%: 19.5 % — AB (ref 0.0–14.0)
NEUT%: 59.2 % (ref 38.4–76.8)
NEUTROS ABS: 2.9 10*3/uL (ref 1.5–6.5)
NRBC: 0 % (ref 0–0)
Platelets: 120 10*3/uL — ABNORMAL LOW (ref 145–400)
RBC: 3.86 10*6/uL (ref 3.70–5.45)
RDW: 14.6 % — AB (ref 11.2–14.5)
Retic %: 1.65 % (ref 0.70–2.10)
Retic Ct Abs: 63.69 10*3/uL (ref 33.70–90.70)
WBC: 4.9 10*3/uL (ref 3.9–10.3)
lymph#: 0.9 10*3/uL (ref 0.9–3.3)

## 2017-01-08 LAB — MORPHOLOGY: PLT EST: DECREASED

## 2017-01-08 LAB — URIC ACID: URIC ACID, SERUM: 5 mg/dL (ref 2.6–7.4)

## 2017-01-08 LAB — LACTATE DEHYDROGENASE: LDH: 291 U/L — AB (ref 125–245)

## 2017-01-08 NOTE — Assessment & Plan Note (Signed)
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 Transplant date 2005, from a diagnosed in December 2015 and treated with [id number]-EPOCH with the patient receiving full 6 cycles of chemotherapy with 4 cycles of prophylactic intrathecal treatment. Treatment was conducted by [facility].  Recently, patient had presentation with recurrent abdominal pain, which led to CT of the abdomen with contrast and pain medications. Imaging revealed recurrent lymphadenopathy in the abdomen strongly suspicious for possible recurrent lymphoma. Presentation was complicated by development of delirium likely due to combination of severe pain, opioid pain medications, acute kidney injury precipitated by hyperuricemia and possibly contrast administration in a dehydrated patient.  Patient required hospitalization, stabilized and improved significantly. Nevertheless, continues to have multiple problems not the least of which is persistent peripheral swelling, worsening performance status, dyspnea on exertion.  Plan: --CT chest wo contrast if significant pulmonary edema or pleural effusions are noted, patient will be referred for admission --Labs: To reevaluate the status of her renal function and blood counts --ECHO to evaluate for possible development of congestive heart failure and the patient with previous history of anthracycline chemotherapy administration --PET-CT to assess state of possible lymphoma and to determine the best target for possible biopsy --Once target was identified, either interventional radiology or general surgery will be consulted for the biopsy --I have intact with Dr. Erling Cruz who is the patient's main Nephrologist and the brace him of the situation. Dr. Florene Glen plans on seeing the patient in his clinic in the next day or 2. --Return to my clinic in 1 week to review results of the lab work and  imaging

## 2017-01-08 NOTE — Progress Notes (Signed)
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 Transplant date 2005, from a diagnosed in December 2015 and treated with [id number] with the patient receiving full 6 cycles of chemotherapy with 4 cycles of prophylactic intrathecal treatment. Treatment was conducted by [facility].  Recently, patient had presentation with recurrent abdominal pain, which led to CT of the abdomen with contrast and pain medications. Imaging revealed recurrent lymphadenopathy in the abdomen strongly suspicious for possible recurrent lymphoma. Presentation was complicated by development of delirium likely due to combination of severe pain, opioid pain medications, acute kidney injury precipitated by hyperuricemia and possibly contrast administration in a dehydrated patient.  Patient required hospitalization, stabilized and improved significantly. Nevertheless, continues to have multiple problems not the least of which is persistent peripheral swelling, worsening performance status, dyspnea on exertion.  Plan: --CT chest wo contrast if significant pulmonary edema or pleural effusions are noted, patient will be referred for admission --Labs: To reevaluate the status of her renal function and blood counts --ECHO to evaluate for possible development of congestive heart failure and the patient with previous history of anthracycline chemotherapy administration --PET-CT to assess state of possible lymphoma and to determine the best target for possible biopsy --Once target was identified, either interventional radiology or general surgery will be consulted for the biopsy --I have intact with Dr. Erling Cruz who is the patient's main Nephrologist and the brace him of the situation. Dr. Florene Glen plans on seeing the patient in his clinic  in the next day or 2. --Return to my clinic in 1 week to review results of the lab work and imaging   Orders Placed This Encounter  Procedures  . CT Chest Wo Contrast    Order Specific Question:   Reason for Exam (SYMPTOM  OR DIAGNOSIS REQUIRED)    Answer:   Shortness of breath, possible pulmonary edema vs pleural effusion vs pneumonitis    Order Specific Question:   Preferred imaging location?    Answer:   [facility]    Order Specific Question:   Call Results- Best Contact Number?    Answer:   [phone] pager    Order Specific Question:   Radiology Contrast Protocol - do NOT remove file path    Answer:   \\[email or web address]\CTProtocols.pdf  . NM PET Image Initial (PI) Skull Base To Thigh    Standing Status:   Future    Standing Expiration Date:   01/08/2018    Order Specific Question:   Reason for Exam (SYMPTOM  OR DIAGNOSIS REQUIRED)    Answer:   patient on immunosuppression for renal Transplant. History of DLBCL in 2016. Recurrent retroperitoneal lymphadenopathy in the abdomen. Please eval extent of the disease.    Order Specific Question:   If indicated for the ordered procedure, I authorize the administration of a radiopharmaceutical per Radiology protocol    Answer:   Yes    Order Specific Question:   Preferred imaging location?    Answer:   [facility]    Order Specific Question:   Radiology Contrast Protocol - do NOT remove file path    Answer:   \\[email or web address]\[email or web address]  . CBC & Diff and Retic    Standing Status:   Future    Number of Occurrences:   1    Standing Expiration Date:   01/08/2018  . Lactate dehydrogenase (LDH)  Standing Status:   Future    Number of Occurrences:   1    Standing Expiration Date:   01/08/2018  . Morphology    Standing Status:   Future    Number of Occurrences:   1    Standing Expiration Date:   01/08/2018  . Uric acid    Standing Status:   Future    Number of Occurrences:   1    Standing  Expiration Date:   01/08/2018  . Phosphorus    Standing Status:   Future    Number of Occurrences:   1    Standing Expiration Date:   01/08/2018  . Beta 2 microglobulin, serum    Standing Status:   Future    Number of Occurrences:   1    Standing Expiration Date:   01/08/2018  . Comprehensive metabolic panel    Standing Status:   Future    Number of Occurrences:   1    Standing Expiration Date:   01/08/2018  . Hepatitis C antibody (reflex if positive)    Standing Status:   Future    Number of Occurrences:   1    Standing Expiration Date:   01/08/2018  . Hepatitis B surface antibody    Standing Status:   Future    Number of Occurrences:   1    Standing Expiration Date:   01/08/2018  . Hepatitis B surface antigen    Standing Status:   Future    Number of Occurrences:   1    Standing Expiration Date:   01/08/2018  . Hepatitis B core antibody, total    Standing Status:   Future    Number of Occurrences:   1    Standing Expiration Date:   01/08/2018  . Epstein-Barr virus VCA, IgG    Standing Status:   Future    Number of Occurrences:   1    Standing Expiration Date:   01/08/2018  . Epstein-Barr virus VCA, IgM    Standing Status:   Future    Number of Occurrences:   1    Standing Expiration Date:   01/08/2018  . Epstein-Barr virus early D antigen antibody, IgG    Standing Status:   Future    Number of Occurrences:   1    Standing Expiration Date:   01/08/2018  . CMV IgM    Standing Status:   Future    Number of Occurrences:   1    Standing Expiration Date:   01/08/2018  . CMV antibody, IgG (EIA)    Standing Status:   Future    Number of Occurrences:   1    Standing Expiration Date:   01/08/2018  . Epstein-Barr virus nuclear antigen antibody, IgG    Standing Status:   Future    Number of Occurrences:   1    Standing Expiration Date:   01/08/2018  . Tacrolimus level    Standing Status:   Future    Number of Occurrences:   1    Standing Expiration Date:   01/08/2018  . ECHOCARDIOGRAM  LIMITED    Standing Status:   Future    Standing Expiration Date:   04/10/2018    Order Specific Question:   Where should this test be performed    Answer:   [place]    Order Specific Question:   Perflutren DEFINITY (image enhancing agent) should be administered unless hypersensitivity or allergy exist    Answer:  Administer Perflutren    Order Specific Question:   Expected Date:    Answer:   ASAP    All questions were answered.  . The patient knows to call the clinic with any problems, questions or concerns.  This note was electronically signed.    History of Presenting Illness Erica Woodward 71 y.o. presenting to the [place] for evaluation of a possible recurrence of lymphoma. Patient has a history of diffuse large B-cell lymphoma diagnosed in 2015 with a detailed history outlined below. Patient also has history of related living donor renal transplant in 2005 and history of chronic immunosuppression. It appears that she was doing well until 12/29/16 when she presented to the emergency room after developing left upper quadrant severe abdominal pain with intermittent pattern. Patient was evaluated the emergency room, CT of the abdomen and pelvis with contrast was obtained on 12/30/16: "Stable cyst in the left hepatic lobe. No calcified gallstones. No biliary dilatation. Pancreas unremarkable. No pancreatic ductal dilatation or surrounding inflammatory changes. Spleen normal in size without focal abnormality. Adrenal glands are within normal limits. Atrophic native kidneys with no hydronephrosis. Unremarkable-appearing right lower quadrant renal transplant. Bladder normal. The stomach is nonenlarged. No dilated small bowel. No colon wall thickening.  Aortic atherosclerosis. No aneurysmal dilatation. Multiple enlarged retroperitoneal lymph nodes. These measure up to 1.7 cm in diameter. Small nodules are nodes anterior to the aortic bifurcation. Lymph nodes adjacent to the left common  iliac vessel. Left pelvic sidewall adenopathy measuring up to 1.7cm. Presacral nodule measuring 1.8 cm with adjacent smaller presacral nodules noted. Reproductive: Uterus unremarkable.  3 cm right ovarian cyst."   Patient received pain medication with resolution of the hunting complaint and was sent home with plans of seeing her treating oncologist in the day or 2. At home, patient had recurrence of abdominal pain in the same location as well as new pain in the heel of the right foot that were severe enough to bring her to 2 years. Family gave patient prescribed oral pain medication, but subsequently patient became confused, somnolent, and disoriented. Throat complaints, patient was admitted to the hospital for further evaluation. Admission went from 06/17-06/21/18. Principal admission diagnosis was acute encephalopathy. Biochemical evaluation was obtained demonstrating nontraumatic electrolyte abnormalities, but patient did have elevated uric acid. The coronary diagnosis included gout, essential hypertension, due to kidney injury. The etiology of acute kidney injury was not clear at that time, medication adjustments were conducted, CellCept was held at that time.  Since returning home, patient has not had recurrence of the severe pain, but continues to have poor performance status compared to her baseline with increasing fatigue, generalized swelling, increased shortness of breath, dyspnea with exertion. Orthopnea was also noted was patient now using 2 pillows to rest. Patient has had no fevers, chills, or night sweats. Patient has drying and productive persistent cough. No significant nausea, vomiting no diarrhea or constipation. No dysuria or hematuria. No new urological complaints.    NHL (non-Hodgkin's lymphoma) ([place])   06/25/2014 Initial Diagnosis    Diffuse large B-cell lymphoma (followed and treated by :  "She presented to an outside hospital in December 2015 with worsening nausea, vomiting, and  left lower quadrant abdominal pain. CT abdomen/ pelvis was concerning for peritoneal carcinomatosis. She was transferred to [facility] service for further management. A CT abdomen/ pelvis with contrast on 06/17/14 showed diffuse peritoneal metastatic disease, ascites, bilateral pleural effusions, and focal thickening of a loop of small bowel.   A paracentesis performed  on 12/7 came back showing a high-grade malignant neoplasm favoring lymphoma. She underwent an open exploratory laparotomy and biopsy of the thickened omentum on 12/11. Pathology showed aggressive diffuse large B-cell lymphoma with Ki-67 of 100% that was strongly positive for CD20, CD10, and BCL-6 and negative for BCL-2. FISH studies were negative for MYC, BCL6, and IgH/BCL2 translocations. EBV PCR and HIV were negative. Bone marrow biopsy showed a mild CD20+ interstitial B-cell lymphocytosis. Labs showed an elevated LDH of 326 and anemia (Hgb 10.5). She did not have a PET CT scan at diagnosis as chemotherapy was immediately started as an inpatient. CSF showed atypical lymphocytes favoring benign reactive pleocytosis but flow cytometry was not performed. She reported a 40 lb weight loss as well as progressive night sweats over 3 months. Thus, she had stage IVB disease at presentation with IPI 5.      Medical History: Past Medical History:  Diagnosis Date  . Basal cell carcinoma   . Cancer [facility])    carcinomatosis  . Chronic headaches   . Diverticulosis   . GERD (gastroesophageal reflux disease)   . HLD (hyperlipidemia)   . Hx of adenomatous polyp of colon 05/21/2016  . Hypertension   . Renal disorder    kidney transplant 2005  . Status post dilation of esophageal narrowing     Surgical History: Past Surgical History:  Procedure Laterality Date  . COLONOSCOPY    . KIDNEY TRANSPLANT  07/26/2013  . LUMBAR PUNCTURE W/ INTRATHECAL CHEMOTHERAPY  08/19/14  . PORTACATH PLACEMENT    . UPPER GASTROINTESTINAL ENDOSCOPY       Family History: Family History  Problem Relation Age of Onset  . Kidney disease Father   . Diabetes Father   . Lung cancer Brother   . Seizures Brother   . Seizures Sister   . Breast cancer Daughter   . Heart disease Brother     Social History: Social History   Social History  . Marital status: Married    Spouse name: N/A  . Number of children: 4  . Years of education: N/A   Occupational History  . retired    Social History Main Topics  . Smoking status: Never Smoker  . Smokeless tobacco: Never Used  . Alcohol use No  . Drug use: No  . Sexual activity: Not on file   Other Topics Concern  . Not on file   Social History Narrative  . No narrative on file    Allergies: No Known Allergies  Medications:  Current Outpatient Prescriptions  Medication Sig Dispense Refill  . aspirin EC 81 MG tablet Take 81 mg by mouth daily.    Marland Kitchen atorvastatin (LIPITOR) 40 MG tablet TAKE 1 TABLET (40 MG TOTAL) BY MOUTH DAILY. 90 tablet 1  . cetirizine (ZYRTEC) 10 MG tablet Take 10 mg by mouth at bedtime.    . [employer] SENNA PLUS 8.6-50 MG per tablet Take 1 tablet by mouth daily.   2  . esomeprazole (NEXIUM) 40 MG capsule Take 1 capsule (40 mg total) by mouth daily. 90 capsule 3  . gabapentin (NEURONTIN) 300 MG capsule Take 1 capsule (300 mg total) by mouth 3 (three) times daily. (Patient taking differently: Take 900 mg by mouth 2 (two) times daily. ) 270 capsule 3  . HYDROcodone-acetaminophen (NORCO/VICODIN) 5-325 MG tablet Take 1-2 tablets by mouth every 4 (four) hours as needed. 12 tablet 0  . magnesium oxide (MAG-OX) 400 MG tablet Take 400 mg by mouth 2 (two) times daily.     Marland Kitchen  ondansetron (ZOFRAN ODT) 4 MG disintegrating tablet Take 1 tablet (4 mg total) by mouth every 8 (eight) hours as needed for nausea or vomiting. 20 tablet 0  . predniSONE (DELTASONE) 10 MG tablet Take 10 mg by mouth daily with breakfast.    . tacrolimus (PROGRAF) 0.5 MG capsule Take 1.5 mg by mouth 2 (two) times  daily. 3 capsules (1.79m bid) in the morning and at night. Verified with family 6/18     Current Facility-Administered Medications  Medication Dose Route Frequency Provider Last Rate Last Dose  . 0.9 %  sodium chloride infusion  500 mL Intravenous Continuous GGatha Mayer MD        Review of Systems: Review of Systems  All other systems reviewed and are negative.    PHYSICAL EXAMINATION Blood pressure 126/66, pulse 95, temperature 98.8 F (37.1 C), temperature source Oral, resp. rate 18, height _0  (1.626 m), weight 237 lb 6.4 oz (107.7 kg), SpO2 98 %.  ECOG PERFORMANCE STATUS: 2 - Symptomatic, <50% confined to bed  Physical Exam  Constitutional: She is oriented to person, place, and time.  Patient appears fatigued.  HENT:  Head: Normocephalic and atraumatic.  Mouth/Throat: Oropharynx is clear and moist. No oropharyngeal exudate.  No oral thrush. No mucosal ulcers  Eyes: Conjunctivae are normal. Pupils are equal, round, and reactive to light. No scleral icterus.  Neck: No tracheal deviation present. No thyromegaly present.  Cardiovascular: Normal rate and regular rhythm.   No murmur heard. Distant heart sounds  Pulmonary/Chest: Effort normal. No stridor. She has no wheezes.  Upper lungs are clear to auscultation. Lower lungs with bilateral dependent crackles and possible bones to percussion at the bases of the lungs.  Abdominal: Soft. She exhibits no distension and no mass. There is tenderness. There is no rebound and no guarding.  If no palpable tenderness in the left upper quadrant of the abdomen without rebound or peritoneal irritation. No palpable mass.  Musculoskeletal:  Tenderness to palpation over the lower lumbar spine and sacrum. No radiation down in the extremities.  Lymphadenopathy:       Head (right side): No submandibular and no occipital adenopathy present.       Head (left side): No submandibular and no occipital adenopathy present.    She has no cervical  adenopathy.    She has no axillary adenopathy.       Right: No inguinal and no supraclavicular adenopathy present.       Left: No inguinal and no supraclavicular adenopathy present.  Neurological: She is oriented to person, place, and time. She has normal sensation, normal reflexes and intact cranial nerves.  Skin: She is not diaphoretic.     LABORATORY DATA: I have personally reviewed the data as listed: Appointment on 01/08/2017  Component Date Value Ref Range Status  . WBC 01/08/2017 4.9  3.9 - 10.3 10e3/uL Final  . NEUT# 01/08/2017 2.9  1.5 - 6.5 10e3/uL Final  . HGB 01/08/2017 12.0  11.6 - 15.9 g/dL Final  . HCT 01/08/2017 37.6  34.8 - 46.6 % Final  . Platelets 01/08/2017 120* 145 - 400 10e3/uL Final  . MCV 01/08/2017 97.4  79.5 - 101.0 fL Final  . MCH 01/08/2017 31.1  25.1 - 34.0 pg Final  . MCHC 01/08/2017 31.9  31.5 - 36.0 g/dL Final  . RBC 01/08/2017 3.86  3.70 - 5.45 10e6/uL Final  . RDW 01/08/2017 14.6* 11.2 - 14.5 % Final  . lymph# 01/08/2017 0.9  0.9 - 3.3 10e3/uL  Final  . MONO# 01/08/2017 1.0* 0.1 - 0.9 10e3/uL Final  . Eosinophils Absolute 01/08/2017 0.2  0.0 - 0.5 10e3/uL Final  . Basophils Absolute 01/08/2017 0.0  0.0 - 0.1 10e3/uL Final  . NEUT% 01/08/2017 59.2  38.4 - 76.8 % Final  . LYMPH% 01/08/2017 17.8  14.0 - 49.7 % Final  . MONO% 01/08/2017 19.5* 0.0 - 14.0 % Final  . EOS% 01/08/2017 3.3  0.0 - 7.0 % Final  . BASO% 01/08/2017 0.2  0.0 - 2.0 % Final  . nRBC 01/08/2017 0  0 - 0 % Final  . Retic % 01/08/2017 1.65  0.70 - 2.10 % Final  . Retic Ct Abs 01/08/2017 63.69  33.70 - 90.70 10e3/uL Final  . Immature Retic Fract 01/08/2017 10.80* 1.60 - 10.00 % Final  . LDH 01/08/2017 291* 125 - 245 U/L Final  . Polychromasia 01/08/2017 Slight  Slight Final  . White Cell Comments 01/08/2017 C/W auto diff   Final  . PLT EST 01/08/2017 Decreased  Adequate Final  . Platelet Morphology 01/08/2017 Oc large platelet  Within Normal Limits Final  . Uric Acid, Serum  01/08/2017 5.0  2.6 - 7.4 mg/dl Final  . Sodium 01/08/2017 141  136 - 145 mEq/L Final  . Potassium 01/08/2017 4.3  3.5 - 5.1 mEq/L Final  . Chloride 01/08/2017 109  98 - 109 mEq/L Final  . CO2 01/08/2017 24  22 - 29 mEq/L Final  . Glucose 01/08/2017 106  70 - 140 mg/dl Final   Glucose reference range is for nonfasting patients. Fasting glucose reference range is 70- 100.  Marland Kitchen BUN 01/08/2017 8.3  7.0 - 26.0 mg/dL Final  . Creatinine 01/08/2017 0.9  0.6 - 1.1 mg/dL Final  . Total Bilirubin 01/08/2017 1.13  0.20 - 1.20 mg/dL Final  . Alkaline Phosphatase 01/08/2017 71  40 - 150 U/L Final  . AST 01/08/2017 20  5 - 34 U/L Final  . ALT 01/08/2017 100* 0 - 55 U/L Final  . Total Protein 01/08/2017 5.6* 6.4 - 8.3 g/dL Final  . Albumin 01/08/2017 3.0* 3.5 - 5.0 g/dL Final  . Calcium 01/08/2017 8.6  8.4 - 10.4 mg/dL Final  . Anion Gap 01/08/2017 8  3 - 11 mEq/L Final  . EGFR 01/08/2017 69* >90 ml/min/1.73 m2 Final   eGFR is calculated using the CKD-EPI Creatinine Equation (2009)  Admission on 12/30/2016, Discharged on 01/03/2017  No results displayed because visit has over 200 results.           Ardath Sax, MD

## 2017-01-09 ENCOUNTER — Telehealth: Payer: Self-pay | Admitting: *Deleted

## 2017-01-09 ENCOUNTER — Emergency Department (HOSPITAL_COMMUNITY)
Admission: EM | Admit: 2017-01-09 | Discharge: 2017-01-09 | Disposition: A | Discharge status: Home / Self Care | Payer: Medicare HMO | Attending: Emergency Medicine | Admitting: Emergency Medicine

## 2017-01-09 ENCOUNTER — Encounter (HOSPITAL_COMMUNITY): Payer: Self-pay | Admitting: Family Medicine

## 2017-01-09 ENCOUNTER — Emergency Department (HOSPITAL_COMMUNITY): Payer: Medicare HMO

## 2017-01-09 DIAGNOSIS — N3000 Acute cystitis without hematuria: Secondary | ICD-10-CM | POA: Diagnosis not present

## 2017-01-09 DIAGNOSIS — I10 Essential (primary) hypertension: Secondary | ICD-10-CM | POA: Diagnosis not present

## 2017-01-09 DIAGNOSIS — M545 Low back pain, unspecified: Secondary | ICD-10-CM

## 2017-01-09 DIAGNOSIS — R300 Dysuria: Secondary | ICD-10-CM | POA: Insufficient documentation

## 2017-01-09 LAB — HEPATITIS B SURFACE ANTIGEN: HEP B S AG: NEGATIVE

## 2017-01-09 LAB — URINALYSIS, ROUTINE W REFLEX MICROSCOPIC
Bilirubin Urine: NEGATIVE
Glucose, UA: NEGATIVE mg/dL
KETONES UR: NEGATIVE mg/dL
Nitrite: NEGATIVE
PROTEIN: NEGATIVE mg/dL
Specific Gravity, Urine: 1.025 (ref 1.005–1.030)
pH: 5 (ref 5.0–8.0)

## 2017-01-09 LAB — HEPATITIS B SURFACE ANTIBODY,QUALITATIVE: HEP B SURFACE AB, QUAL: NONREACTIVE

## 2017-01-09 LAB — COMPREHENSIVE METABOLIC PANEL
ALBUMIN: 3.2 g/dL — AB (ref 3.5–5.0)
ALT: 64 U/L — ABNORMAL HIGH (ref 14–54)
AST: 20 U/L (ref 15–41)
Alkaline Phosphatase: 65 U/L (ref 38–126)
Anion gap: 4 — ABNORMAL LOW (ref 5–15)
BUN: 13 mg/dL (ref 6–20)
CHLORIDE: 110 mmol/L (ref 101–111)
CO2: 26 mmol/L (ref 22–32)
Calcium: 8.7 mg/dL — ABNORMAL LOW (ref 8.9–10.3)
Creatinine, Ser: 0.89 mg/dL (ref 0.44–1.00)
GFR calc Af Amer: >60 mL/min (ref >60)
GFR calc non Af Amer: >60 mL/min (ref >60)
GLUCOSE: 106 mg/dL — AB (ref 65–99)
POTASSIUM: 4.8 mmol/L (ref 3.5–5.1)
SODIUM: 140 mmol/L (ref 135–145)
Total Bilirubin: 0.6 mg/dL (ref 0.3–1.2)
Total Protein: 5.7 g/dL — ABNORMAL LOW (ref 6.5–8.1)

## 2017-01-09 LAB — CBC WITH DIFFERENTIAL/PLATELET
BASOS ABS: 0 10*3/uL (ref 0.0–0.1)
Basophils Relative: 0 %
EOS ABS: 0.1 10*3/uL (ref 0.0–0.7)
EOS PCT: 2 %
HCT: 37.5 % (ref 36.0–46.0)
Hemoglobin: 12 g/dL (ref 12.0–15.0)
LYMPHS PCT: 21 %
Lymphs Abs: 1.1 10*3/uL (ref 0.7–4.0)
MCH: 30.1 pg (ref 26.0–34.0)
MCHC: 32 g/dL (ref 30.0–36.0)
MCV: 94 fL (ref 78.0–100.0)
MONO ABS: 1 10*3/uL (ref 0.1–1.0)
Monocytes Relative: 18 %
Neutro Abs: 3.1 10*3/uL (ref 1.7–7.7)
Neutrophils Relative %: 58 %
PLATELETS: 178 10*3/uL (ref 150–400)
RBC: 3.99 MIL/uL (ref 3.87–5.11)
RDW: 14.8 % (ref 11.5–15.5)
WBC: 5.3 10*3/uL (ref 4.0–10.5)

## 2017-01-09 LAB — BETA 2 MICROGLOBULIN, SERUM: Beta-2: 2.9 mg/L — ABNORMAL HIGH (ref 0.6–2.4)

## 2017-01-09 LAB — EPSTEIN-BARR VIRUS EARLY D ANTIGEN ANTIBODY, IGG: EBV Early Antigen Ab, IgG: <9 U/mL (ref 0.0–8.9)

## 2017-01-09 LAB — CMV IGM: CMV IgM Ser EIA-aCnc: <30 AU/mL (ref 0.0–29.9)

## 2017-01-09 LAB — EPSTEIN-BARR VIRUS VCA, IGG

## 2017-01-09 LAB — PHOSPHORUS: Phosphorus, Ser: 2.5 mg/dL (ref 2.5–4.5)

## 2017-01-09 LAB — EPSTEIN-BARR VIRUS NUCLEAR ANTIGEN ANTIBODY, IGG: EBV NA IgG: 148 U/mL — ABNORMAL HIGH (ref 0.0–17.9)

## 2017-01-09 LAB — I-STAT TROPONIN, ED: TROPONIN I, POC: 0 ng/mL (ref 0.00–0.08)

## 2017-01-09 LAB — CMV ANTIBODY, IGG (EIA)

## 2017-01-09 LAB — EPSTEIN-BARR VIRUS VCA, IGM

## 2017-01-09 LAB — LIPASE, BLOOD: LIPASE: 24 U/L (ref 11–51)

## 2017-01-09 LAB — HEPATITIS B CORE ANTIBODY, TOTAL: Hep B Core Ab, Tot: NEGATIVE

## 2017-01-09 LAB — HEPATITIS C ANTIBODY (REFLEX): HCV Ab: <0.1 s/co ratio (ref 0.0–0.9)

## 2017-01-09 MED ORDER — CEPHALEXIN 500 MG PO CAPS: 500.0000 mg | ORAL_CAPSULE | Freq: Two times a day (BID) | ORAL | 0 refills | Status: AC

## 2017-01-09 MED ORDER — HYDROCODONE-ACETAMINOPHEN 5-325 MG PO TABS: 1.0000 | ORAL_TABLET | Freq: Four times a day (QID) | ORAL | 0 refills | Status: DC | PRN

## 2017-01-09 MED ORDER — SODIUM CHLORIDE 0.9 % IV BOLUS (SEPSIS)
500.0000 mL | Freq: Once | INTRAVENOUS | Status: AC
  Administered 2017-01-09: 500 mL via INTRAVENOUS

## 2017-01-09 MED ORDER — HYDROCODONE-ACETAMINOPHEN 5-325 MG PO TABS
1.0000 | ORAL_TABLET | Freq: Once | ORAL | Status: AC
  Administered 2017-01-09: 1 via ORAL
  Filled 2017-01-09: qty 1

## 2017-01-09 NOTE — Telephone Encounter (Signed)
Received call from daughter requesting a call back from nurse.  Spoke with daughter Erica Woodward, and was informed that pt has increased pain in lower back - pt can hardly walk - per daughter.    Stated bowel and bladder function fine. Pt has only a few tablets of Hydrocodone - given by ER provider.   Erica Woodward stated pain not relieved by Hydrocodone. Erica Woodward wanted to know what Dr. Lebron Conners would suggest. [name][facility]    Phone      [phone].

## 2017-01-09 NOTE — ED Triage Notes (Signed)
Patient is experiencing lower back pain that started yesterday but got more severe last night. Patient is a newly diagnosed CA patient who is not currently taking chemotherapy or radiation. Patient denies any recent injury.

## 2017-01-09 NOTE — Discharge Instructions (Signed)

## 2017-01-09 NOTE — Telephone Encounter (Signed)
Dr. Lebron Conners notified.   Spoke with daughter Massie Maroon, and instructed her to take pt to [facility] ER now for further evaluation.   Massie Maroon voiced understanding.

## 2017-01-09 NOTE — ED Provider Notes (Signed)
Emergency Department Provider Note   I have reviewed the triage vital signs and the nursing notes.   HISTORY  Chief Complaint Back Pain   HPI Erica Woodward is a 71 y.o. female with PMH of GERD, HLD, obesity, CKD s/p kidney transplant in 2006, and concern for recurrent lymphoma, recently referred to oncology and awaiting PET scan, presents to the emergency department for evaluation of new onset back pain. The patient has had ongoing right flank and lower back pain but her midline pain is new over the last 2 days. She had a CTabdomen a week ago and was admitted for encephalopathy. No evidence of bony lesions on that scan. Symptoms resolved with supportive care and she was referred to oncology, Dr. Lebron Conners. The patient was discharged with hydrocodone but yesterday developed severe lower back pain that she describes as midline and nonradiating. She has since run out of Hydrocodone but prior to this her pain was well controlled. No leg weakness or numbness. Pain worse with movement. No bowel or bladder incontinence. No fevers. Patient was recently ambulatory but her ambulation has reduced significantly since diagnosis.    Past Medical History:  Diagnosis Date  . Basal cell carcinoma   . Cancer [facility])    carcinomatosis  . Chronic headaches   . Diverticulosis   . GERD (gastroesophageal reflux disease)   . HLD (hyperlipidemia)   . Hx of adenomatous polyp of colon 05/21/2016  . Hypertension   . Renal disorder    kidney transplant 2005  . Status post dilation of esophageal narrowing     Patient Active Problem List   Diagnosis Date Noted  . Shortness of breath 01/08/2017  . Anasarca 01/08/2017  . History of B-cell lymphoma 01/08/2017  . ARF (acute renal failure) ([place]) 12/31/2016  . Hx of adenomatous polyp of colon 05/21/2016  . NHL (non-Hodgkin's lymphoma) ([place]) 08/02/2014  . GERD (gastroesophageal reflux disease) 06/16/2014  . Renal transplant recipient 07/21/2010  . HLD  (hyperlipidemia) 01/17/2010  . GOUT 01/17/2010  . Essential hypertension 01/17/2010  . KNEE INJURY, RIGHT 01/17/2010    Past Surgical History:  Procedure Laterality Date  . COLONOSCOPY    . KIDNEY TRANSPLANT  07/26/2013  . LUMBAR PUNCTURE W/ INTRATHECAL CHEMOTHERAPY  08/19/14  . PORTACATH PLACEMENT    . UPPER GASTROINTESTINAL ENDOSCOPY      Current Outpatient Rx  . Order #: [id number] Class: Historical Med  . Order #: [id number] Class: Normal  . Order #: [id number] Class: Historical Med  . Order #: [id number] Class: Normal  . Order #: [id number] Class: Normal  . Order #: [id number] Class: Historical Med  . Order #: [id number] Class: Print  . Order #: [id number] Class: Historical Med  . Order #: [id number] Class: Historical Med  . Order #: [id number] Class: Print  . Order #: [id number] Class: Print    Allergies Patient has no known allergies.  Family History  Problem Relation Age of Onset  . Kidney disease Father   . Diabetes Father   . Lung cancer Brother   . Seizures Brother   . Seizures Sister   . Breast cancer Daughter   . Heart disease Brother     Social History Social History  Substance Use Topics  . Smoking status: Never Smoker  . Smokeless tobacco: Never Used  . Alcohol use No    Review of Systems  Constitutional: No fever/chills Eyes: No visual changes. ENT: No sore throat. Cardiovascular: Denies chest pain. Respiratory: Denies shortness of breath. Gastrointestinal: Positive right sided abdominal pain.  No nausea, no vomiting.  No  diarrhea.  No constipation. Genitourinary: Negative for dysuria. Musculoskeletal: Positive for back pain. Skin: Negative for rash. Neurological: Negative for headaches, focal weakness or numbness.  10-point ROS otherwise negative.  ____________________________________________   PHYSICAL EXAM:  VITAL SIGNS: ED Triage Vitals  Enc Vitals Group     BP 01/09/17 1454 104/71     Pulse Rate 01/09/17 1454 (!) 103     Resp 01/09/17 1454 18       Temp 01/09/17 1454 97.7 F (36.5 C)     Temp Source 01/09/17 1454 Oral     SpO2 01/09/17 1454 96 %     Weight 01/09/17 1513 237 lb (107.5 kg)     Height 01/09/17 1513 5\' 4"  (1.626 m)     Pain Score 01/09/17 1513 10   Constitutional: Alert and oriented. Well appearing and in no acute distress. Eyes: Conjunctivae are normal.  Head: Atraumatic. Nose: No congestion/rhinnorhea. Mouth/Throat: Mucous membranes are moist.  Oropharynx non-erythematous. Neck: No stridor.   Cardiovascular: Normal rate, regular rhythm. Good peripheral circulation. Grossly normal heart sounds.   Respiratory: Normal respiratory effort.  No retractions. Lungs CTAB. Gastrointestinal: Soft and nontender. No distention.  Musculoskeletal: No lower extremity tenderness nor edema. No gross deformities of extremities. Positive midline lower back tenderness to palpation.  Neurologic:  Normal speech and language. No gross focal neurologic deficits are appreciated. Normal strength and sensation of B/L LEs.  Skin:  Skin is warm, dry and intact. No rash noted. Psychiatric: Mood and affect are normal. Speech and behavior are normal.  ____________________________________________   LABS (all labs ordered are listed, but only abnormal results are displayed)  Labs Reviewed  URINALYSIS, ROUTINE W REFLEX MICROSCOPIC - Abnormal; Notable for the following:       Result Value   Color, Urine AMBER (*)    APPearance HAZY (*)    Hgb urine dipstick LARGE (*)    Leukocytes, UA MODERATE (*)    Bacteria, UA FEW (*)    Squamous Epithelial / LPF 0-5 (*)    All other components within normal limits  COMPREHENSIVE METABOLIC PANEL - Abnormal; Notable for the following:    Glucose, Bld 106 (*)    Calcium 8.7 (*)    Total Protein 5.7 (*)    Albumin 3.2 (*)    ALT 64 (*)    Anion gap 4 (*)    All other components within normal limits  URINE CULTURE  CBC WITH DIFFERENTIAL/PLATELET  LIPASE, BLOOD  I-STAT TROPOININ, ED    ____________________________________________  RADIOLOGY  Dg Lumbar Spine Complete  Result Date: 01/09/2017 CLINICAL DATA:  Low back pain EXAM: LUMBAR SPINE - COMPLETE 4+ VIEW COMPARISON:  CT 12/30/2016 FINDINGS: Hypoplastic appearing ribs at T12. Surgical clips in the right pelvis. Bilateral tubal ligation clips. Grade 1 anterolisthesis of L4 on L5. Vertebral body heights are maintained. Multilevel degenerative disc changes, mild at L1-L2 and L2-L3, moderate at L5-S1 and moderate severe at L4-L5. Facet degenerative changes of the lower lumbar spine. IMPRESSION: 1. No acute osseous abnormality 2. Moderate to marked multilevel degenerative disc changes Electronically Signed   By: Donavan Foil M.D.   On: 01/09/2017 19:14    ____________________________________________   PROCEDURES  Procedure(s) performed:   Procedures  None ____________________________________________   INITIAL IMPRESSION / ASSESSMENT AND PLAN / ED COURSE  Pertinent labs & imaging results that were available during my care of the patient were reviewed by me and considered in my medical decision making (see chart for details).  Patient resents to  the emergency department for evaluation of new onset midline lower back pain in the setting of recent, likely lymphoma diagnosis. She is awaiting outpatient PET scan and following with Dr. Lebron Conners with Oncology. In terms of acute pain in the emergency department the family reports significant somnolence with morphine. Plan for Hydrocodone and plain film of the lumbar to spine to assess for any new osseous lesions. I reviewed the CT abdomen and pelvis performed 10 days ago which did not show any obvious bony lesion at that time. Patient has no sign on my exam to suggest acute spinal cord emergency.   Patient's and plain films are largely unremarkable. No evidence of interval development of bony lesions on plain film. No indication for MRI spine or repeat CT imaging or the  abdomen/spine. Patient feeling better with Hydrocodone. Also found to have a UTI. Plan to treat with Keflex and follow urine culture. She is concerned regarding the scheduling of PET scan. Encouraged her to call her Oncologist tomorrow to ask about scheduling PET. She has a follow up appointment July 5th. Also discussed that I can only prescribe a limited about of pain medication from the ED and she should f/u with her PCP for further opiate prescriptions. Discussed impression and plan with the patient and family in detail who are pleased at discharge.   At this time, I do not feel there is any life-threatening condition present. I have reviewed and discussed all results (EKG, imaging, lab, urine as appropriate), exam findings with patient. I have reviewed nursing notes and appropriate previous records.  I feel the patient is safe to be discharged home without further emergent workup. Discussed usual and customary return precautions. Patient and family (if present) verbalize understanding and are comfortable with this plan.  Patient will follow-up with their primary care provider. If they do not have a primary care provider, information for follow-up has been provided to them. All questions have been answered.  ____________________________________________  FINAL CLINICAL IMPRESSION(S) / ED DIAGNOSES  Final diagnoses:  Acute midline low back pain without sciatica  Dysuria  Acute cystitis without hematuria     MEDICATIONS GIVEN DURING THIS VISIT:  Medications  HYDROcodone-acetaminophen (NORCO/VICODIN) 5-325 MG per tablet 1 tablet (1 tablet Oral Given 01/09/17 1827)  sodium chloride 0.9 % bolus 500 mL (0 mLs Intravenous Stopped 01/09/17 2122)     NEW OUTPATIENT MEDICATIONS STARTED DURING THIS VISIT:  Discharge Medication List as of 01/09/2017  9:07 PM    START taking these medications   Details  cephALEXin (KEFLEX) 500 MG capsule Take 1 capsule (500 mg total) by mouth 2 (two) times daily.,  Starting Wed 01/09/2017, Until Wed 01/16/2017, Print       Norco refill #12 tabs.   Note:  This document was prepared using Dragon voice recognition software and may include unintentional dictation errors.  Nanda Quinton, MD Emergency Medicine   Long, [facility], MD 01/10/17 [phone]

## 2017-01-10 LAB — TACROLIMUS LEVEL: TACROLIMUS LVL: 12.2 ng/mL (ref 2.0–20.0)

## 2017-01-11 LAB — URINE CULTURE: Special Requests: NORMAL

## 2017-01-14 ENCOUNTER — Encounter: Payer: Self-pay | Admitting: Family Medicine

## 2017-01-14 ENCOUNTER — Ambulatory Visit (INDEPENDENT_AMBULATORY_CARE_PROVIDER_SITE_OTHER): Payer: Medicare HMO | Admitting: Family Medicine

## 2017-01-14 VITALS — BP 136/90 | HR 90 | Temp 98.5°F | Resp 20 | Ht 64.0 in | Wt 231.0 lb

## 2017-01-14 DIAGNOSIS — Z09 Encounter for follow-up examination after completed treatment for conditions other than malignant neoplasm: Secondary | ICD-10-CM

## 2017-01-14 DIAGNOSIS — M5431 Sciatica, right side: Secondary | ICD-10-CM | POA: Diagnosis not present

## 2017-01-14 DIAGNOSIS — C8590 Non-Hodgkin lymphoma, unspecified, unspecified site: Secondary | ICD-10-CM

## 2017-01-14 MED ORDER — PREDNISONE 20 MG PO TABS: ORAL_TABLET | ORAL | 0 refills | Status: AC

## 2017-01-14 MED ORDER — HYDROCODONE-ACETAMINOPHEN 5-325 MG PO TABS: 1.0000 | ORAL_TABLET | Freq: Four times a day (QID) | ORAL | 0 refills | Status: DC | PRN

## 2017-01-14 NOTE — Progress Notes (Signed)
Subjective:    Patient ID: Erica Woodward, female    DOB: 1946/01/03, 71 y.o.   MRN: [id number]  HPI Unfortunately, the patient was recently admitted to the hospital with encephalopathy and abdominal pain and nausea and vomiting. I have copied relevant portions of the discharge summary and included them below for my reference:  Admit date: 12/30/2016 Discharge date: 01/03/2017  Admitted From: Home Disposition:  Home  Recommendations for Outpatient Follow-up:  1. Follow up with PCP in 1 week 2. Follow up with oncology. Patient is requesting to transfer care to [facility]. This has been arranged; their office will call patient after discharge to arrange. 3. Please obtain BMP in 1 week   Discharge Condition: Stable CODE STATUS: Partial, do not intubate  Diet recommendation: Heart healthy   Brief/Interim Summary: Erica Woodward a 71 y.o.femalewith history of high-grade B-cell lymphoma treated in 2016 at [facility], kidney transplant on immunosuppressants, hyperlipidemia who was in the ER on 6/16 for nausea and underwent a CT of the abdomen pelvis. This showed lymphadenopathy consistent with possible cancer recurrence. She was discharged home and soon afterwards, began to have vomiting which was difficult to control. She subsequently developed "pain" in her left heel and in her abdomen. The family states that she was screaming in pain when she tried to walk and was behaving like she was disoriented. Her daughter decided to give her Hydrocodone for her pain and then decided to bring her back into the ER. In the ER the patient was noted to be quite sleepy and oriented only to self. She was noted to have tenderness and erythema in her left heel.   Patient was treated with IV fluids. CT head was unrevealing for etiology for acute encephalopathy. Her mentation returned back to baseline with supportive care. She was initially treated for abdominal pain with IV  antibiotics as well as underwent CT abdomen pelvis which showed lymphadenopathy with no other etiology for pain. Due to acute kidney injury and history of renal transplant, contrasted CT was not pursued. Her abdominal pain, nausea, vomiting resolved and patient was tolerating meals prior to discharge. Patient and family elected to transfer her care for her non-Hodgkin's lymphoma to [facility]. This has been arranged and the office will call patient after discharge to arrange follow up appointment.  Discharge Diagnoses:  Principal Problem:   Acute encephalopathy Active Problems:   HLD (hyperlipidemia)   GOUT   Essential hypertension   KIDNEY TRANSPLANTATION, HX OF   NHL (non-Hodgkin's lymphoma) ([facility])   ARF (acute renal failure) ([facility])   Acute encephalopathy - CT head without contrast unrevealing - Suspect dehydration, inability to rest properlydue to vomiting for >24 hrs and medications including Hydrocodone and Neurontin in the setting of AKI may be causing her mental status change - Resolved back to baseline   Abdominal pain, nausea and vomiting - She underwent a CT abd/ pelvis on 6/18 withoutoral and with IV contrast which showed lymphadenopathy and no other etiology for pain - Could consider repeat CT with contrast, however, due to impaired renal function, will hold off. Pain has improved, nausea vomiting resolved. Appetite has returned - Advance diet with no recurrence in symptoms. Resolved. Suspect probably viral in etiology. Stop antibiotics.   AKI - Hxrenal transplant, baseline Cr 0.83-1.0  - Prograf, Prednisone  - Improved and stable, repeat BMP as outpatient   NHL (non-Hodgkin's lymphoma)  - Treated in 2016 and was in remission - Appears to have a recurrence-  will need a biopsy - FNA of a lymph node would not give an optimal sample and an excisional biopsy is preferred - as the abdominal lympnode are difficult to obtain without a laparoscopy, will  need a CT chest with contrast to determine a bettersite to be biopsied - Dr. Wynelle Cleveland spokewith Oncology, Dr Julien Nordmann, 6/18, who is recommending she f/u with her original oncologist for a work up for the possible recurrence and if she wants local care, she can be referred to the [facility]. Patient and family state that they would like to transfer care to [facility]. Will arrange at time of discharge.   Left heel pain - Xray negative, improved, ambulating without issue   01/14/17 One week after discharge from the hospital, the patient repairs and to the emergency room with acute low back pain. X-ray at that time reveal no acute osseous abnormality, but did show DDD:  Grade 1 anterolisthesis of L4 on L5. Vertebral body heights are maintained. Multilevel degenerative disc changes, mild at L1-L2 and L2-L3, moderate at L5-S1 and moderate severe at L4-L5. Facet degenerative changes of the lower lumbar spine.  She is here for follow up.  She is accompanied by her daughter and another family member. They have a multitude of concerns. #1 they question if there is any way we can provide her PET scan. She is currently scheduled for July 13. They're waiting to perform a biopsy until after the have the PET scan results and this has the patient extremely anxious because she is eager to start treatment. #2 she reports severe pain in her lower back. She does have moderate to severe degenerative disc disease at L4-L5 and this is the exact location of her pain. She also complains of pain radiating into the lateral aspect of her right hip and down her right leg into her right foot. It is made worse by standing. 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 She is now reporting worsening muscle weakness. Her legs occasionally give out on her. She states that her legs become weak for no reason. Recent CT scan revealed no cancer or osseous lesions in the spine. She also complains of left  lower quadrant abdominal pain. This is constant and pressure-like and seems to be related to her lymphoma. She denies any constipation. She denies any blood in her stool. She denies any fever or nausea vomiting. She also reports shaking and muscle spasms in her arms and in her legs. These appear to be myoclonic jerks which happen spontaneously.  They're not tremors. Past Medical History:  Diagnosis Date  . Basal cell carcinoma   . Cancer [facility])    carcinomatosis  . Chronic headaches   . Diverticulosis   . GERD (gastroesophageal reflux disease)   . HLD (hyperlipidemia)   . Hx of adenomatous polyp of colon 05/21/2016  . Hypertension   . Renal disorder    kidney transplant 2005  . Status post dilation of esophageal narrowing    Past Surgical History:  Procedure Laterality Date  . COLONOSCOPY    . KIDNEY TRANSPLANT  07/26/2013  . LUMBAR PUNCTURE W/ INTRATHECAL CHEMOTHERAPY  08/19/14  . PORTACATH PLACEMENT    . UPPER GASTROINTESTINAL ENDOSCOPY     Current Outpatient Prescriptions on File Prior to Visit  Medication Sig Dispense Refill  . aspirin EC 81 MG tablet Take 81 mg by mouth daily.    Marland Kitchen atorvastatin (LIPITOR) 40 MG tablet TAKE 1 TABLET (40 MG TOTAL) BY  MOUTH DAILY. 90 tablet 1  . cephALEXin (KEFLEX) 500 MG capsule Take 1 capsule (500 mg total) by mouth 2 (two) times daily. 14 capsule 0  . cetirizine (ZYRTEC) 10 MG tablet Take 10 mg by mouth at bedtime.    Marland Kitchen esomeprazole (NEXIUM) 40 MG capsule Take 1 capsule (40 mg total) by mouth daily. 90 capsule 3  . gabapentin (NEURONTIN) 300 MG capsule Take 1 capsule (300 mg total) by mouth 3 (three) times daily. (Patient taking differently: Take 900 mg by mouth 2 (two) times daily. ) 270 capsule 3  . magnesium oxide (MAG-OX) 400 MG tablet Take 400 mg by mouth 2 (two) times daily.     . ondansetron (ZOFRAN ODT) 4 MG disintegrating tablet Take 1 tablet (4 mg total) by mouth every 8 (eight) hours as needed for nausea or vomiting. 20 tablet 0  .  predniSONE (DELTASONE) 10 MG tablet Take 10 mg by mouth daily with breakfast.    . tacrolimus (PROGRAF) 0.5 MG capsule Take 1.5 mg by mouth 2 (two) times daily. Take 3 capsules (1.5 mg) BID     Current Facility-Administered Medications on File Prior to Visit  Medication Dose Route Frequency Provider Last Rate Last Dose  . 0.9 %  sodium chloride infusion  500 mL Intravenous Continuous Gatha Mayer, MD       No Known Allergies Social History   Social History  . Marital status: Married    Spouse name: N/A  . Number of children: 4  . Years of education: N/A   Occupational History  . retired    Social History Main Topics  . Smoking status: Never Smoker  . Smokeless tobacco: Never Used  . Alcohol use No  . Drug use: No  . Sexual activity: Not on file   Other Topics Concern  . Not on file   Social History Narrative  . No narrative on file          Review of Systems  All other systems reviewed and are negative.      Objective:   Physical Exam  Constitutional: She appears well-developed and well-nourished. She appears distressed.  Neck: Neck supple. No thyromegaly present.  Cardiovascular: Normal rate, regular rhythm and normal heart sounds.   No murmur heard. Pulmonary/Chest: Effort normal and breath sounds normal. No respiratory distress. She has no wheezes. She has no rales. She exhibits no tenderness.  Abdominal: Soft. Bowel sounds are normal. She exhibits no distension. There is no tenderness. There is no rebound and no guarding.  Musculoskeletal:       Lumbar back: She exhibits decreased range of motion, tenderness and pain.  Lymphadenopathy:    She has no cervical adenopathy.  Vitals reviewed.  In pain  Muscle strength is 5 over 5 equal and symmetric in both legs. This includes hip flexors, hip extensors, knee flexors, knee extensors, and ankle dorsi and plantar flexors. She does have decreased sensation in both legs. It is difficult to elicit any reflexes  as the patient is constantly tensing up in pain. She also has occasional jerking motions in the extremities     Assessment & Plan:  Right sided sciatica - Plan: predniSONE (DELTASONE) 20 MG tablet, HYDROcodone-acetaminophen (NORCO/VICODIN) 5-325 MG tablet, CBC with Differential/Platelet, COMPLETE METABOLIC PANEL WITH GFR Patient presents with a multitude of problems. I explained that we must address the most pressing issues first. By far, my biggest concern, is her lymphoma in her abdomen. I will do what I can expedite her  PET scan. I believe the pain in her back, the pain radiating in her leg all represent lumbar radiculopathy. I will proceed with an MRI of the lumbar spine to evaluate further. Meanwhile start the patient on a prednisone taper pack for assume his nerve irritation in addition to hydrocodone 5/325 one by mouth every 6 hours when necessary pain. I will defer the myoclonic jerks until after we have addressed the cancer and the pain in her back and weakness in her legs.

## 2017-01-15 LAB — COMPLETE METABOLIC PANEL WITH GFR
ALT: 25 U/L (ref 6–29)
AST: 22 U/L (ref 10–35)
Albumin: 3.8 g/dL (ref 3.6–5.1)
Alkaline Phosphatase: 78 U/L (ref 33–130)
BUN: 13 mg/dL (ref 7–25)
CHLORIDE: 101 mmol/L (ref 98–110)
CO2: 28 mmol/L (ref 20–31)
CREATININE: 1.34 mg/dL — AB (ref 0.60–0.93)
Calcium: 9 mg/dL (ref 8.6–10.4)
GFR, Est African American: 46 mL/min — ABNORMAL LOW (ref >=60)
GFR, Est Non African American: 40 mL/min — ABNORMAL LOW (ref >=60)
GLUCOSE: 97 mg/dL (ref 70–99)
Potassium: 5.3 mmol/L (ref 3.5–5.3)
SODIUM: 140 mmol/L (ref 135–146)
Total Bilirubin: 0.6 mg/dL (ref 0.2–1.2)
Total Protein: 6.1 g/dL (ref 6.1–8.1)

## 2017-01-15 LAB — CBC WITH DIFFERENTIAL/PLATELET
BASOS PCT: 0 %
Basophils Absolute: 0 cells/uL (ref 0–200)
Eosinophils Absolute: 118 cells/uL (ref 15–500)
Eosinophils Relative: 2 %
HCT: 40 % (ref 35.0–45.0)
Hemoglobin: 12.7 g/dL (ref 12.0–15.0)
LYMPHS PCT: 37 %
Lymphs Abs: 2183 cells/uL (ref 850–3900)
MCH: 30.6 pg (ref 27.0–33.0)
MCHC: 31.8 g/dL — ABNORMAL LOW (ref 32.0–36.0)
MCV: 96.4 fL (ref 80.0–100.0)
MONOS PCT: 9 %
MPV: 11.1 fL (ref 7.5–12.5)
Monocytes Absolute: 531 cells/uL (ref 200–950)
NEUTROS ABS: 3068 {cells}/uL (ref 1500–7800)
Neutrophils Relative %: 52 %
PLATELETS: 163 10*3/uL (ref 140–400)
RBC: 4.15 MIL/uL (ref 3.80–5.10)
RDW: 14.8 % (ref 11.0–15.0)
WBC: 5.9 10*3/uL (ref 3.8–10.8)

## 2017-01-17 ENCOUNTER — Telehealth: Payer: Self-pay | Admitting: *Deleted

## 2017-01-17 ENCOUNTER — Ambulatory Visit: Payer: Self-pay | Admitting: Hematology and Oncology

## 2017-01-17 ENCOUNTER — Encounter: Payer: Self-pay | Admitting: Family Medicine

## 2017-01-17 ENCOUNTER — Telehealth: Payer: Self-pay | Admitting: Hematology and Oncology

## 2017-01-17 NOTE — Telephone Encounter (Signed)
Gave pts daughter appt time for MRI at [facility]. And informed her of lab results and to decrease lasix and potassium by 1/2. Let her know this was also on mychart.

## 2017-01-17 NOTE — Telephone Encounter (Signed)
Unable to reach pt on numbers listed to inform of r/s MD appt today to 7/16 at 1 pm due to scans not done. RN tried to reach patient as well per sch msg. Letter mailed 7/5

## 2017-01-18 ENCOUNTER — Other Ambulatory Visit: Payer: Self-pay | Admitting: Hematology and Oncology

## 2017-01-18 ENCOUNTER — Ambulatory Visit (HOSPITAL_COMMUNITY)
Admission: RE | Admit: 2017-01-18 | Discharge: 2017-01-18 | Disposition: A | Discharge status: Home / Self Care | Payer: Medicare HMO | Source: Ambulatory Visit | Attending: Hematology and Oncology | Admitting: Hematology and Oncology

## 2017-01-18 DIAGNOSIS — R0602 Shortness of breath: Secondary | ICD-10-CM | POA: Diagnosis not present

## 2017-01-18 DIAGNOSIS — R601 Generalized edema: Secondary | ICD-10-CM

## 2017-01-18 DIAGNOSIS — I5030 Unspecified diastolic (congestive) heart failure: Secondary | ICD-10-CM | POA: Diagnosis not present

## 2017-01-18 DIAGNOSIS — I517 Cardiomegaly: Secondary | ICD-10-CM | POA: Insufficient documentation

## 2017-01-18 LAB — ECHOCARDIOGRAM COMPLETE
CHL CUP DOP CALC LVOT VTI: 22.7 cm
CHL CUP MV DEC (S): 218
E/e' ratio: 10.06
EWDT: 218 ms
FS: 36 % (ref 28–44)
IVS/LV PW RATIO, ED: 0.73
LA ID, A-P, ES: 30 mm
LADIAMINDEX: 1.44 cm/m2
LAVOLA4C: 36.5 mL
LEFT ATRIUM END SYS DIAM: 30 mm
LV E/e' medial: 10.06
LV PW d: 14.1 mm — AB (ref 0.6–1.1)
LVEEAVG: 10.06
LVELAT: 8.05 cm/s
LVOT area: 1.54 cm2
LVOTD: 14 mm
LVOTPV: 106 cm/s
LVOTSV: 35 mL
MV Peak grad: 3 mmHg
MV pk A vel: 93.1 m/s
MV pk E vel: 81 m/s
RV LATERAL S' VELOCITY: 6.09 cm/s
RV TAPSE: 17.9 mm
TDI e' lateral: 8.05
TDI e' medial: 5.66

## 2017-01-18 NOTE — Telephone Encounter (Signed)
Documented on lab results

## 2017-01-18 NOTE — Progress Notes (Signed)
  Echocardiogram 2D Echocardiogram has been performed.  Thom Ollinger T Debbie Yearick 01/18/2017, 11:05 AM

## 2017-01-21 ENCOUNTER — Encounter: Payer: Self-pay | Admitting: Family Medicine

## 2017-01-24 DIAGNOSIS — Z6841 Body Mass Index (BMI) 40.0 and over, adult: Secondary | ICD-10-CM | POA: Diagnosis not present

## 2017-01-24 DIAGNOSIS — C8580 Other specified types of non-Hodgkin lymphoma, unspecified site: Secondary | ICD-10-CM | POA: Diagnosis not present

## 2017-01-24 DIAGNOSIS — Z940 Kidney transplant status: Secondary | ICD-10-CM | POA: Diagnosis not present

## 2017-01-24 DIAGNOSIS — R0609 Other forms of dyspnea: Secondary | ICD-10-CM | POA: Diagnosis not present

## 2017-01-24 DIAGNOSIS — M0230 Reiter's disease, unspecified site: Secondary | ICD-10-CM | POA: Diagnosis not present

## 2017-01-25 ENCOUNTER — Ambulatory Visit (HOSPITAL_COMMUNITY)
Admission: RE | Admit: 2017-01-25 | Discharge: 2017-01-25 | Disposition: A | Discharge status: Home / Self Care | Payer: Medicare HMO | Source: Ambulatory Visit | Attending: Hematology and Oncology | Admitting: Hematology and Oncology

## 2017-01-25 ENCOUNTER — Telehealth: Payer: Self-pay

## 2017-01-25 ENCOUNTER — Ambulatory Visit (HOSPITAL_COMMUNITY)
Admission: RE | Admit: 2017-01-25 | Discharge: 2017-01-25 | Disposition: A | Discharge status: Home / Self Care | Payer: Medicare HMO | Source: Ambulatory Visit | Attending: Family Medicine | Admitting: Family Medicine

## 2017-01-25 ENCOUNTER — Other Ambulatory Visit: Payer: Self-pay | Admitting: Hematology and Oncology

## 2017-01-25 DIAGNOSIS — N9489 Other specified conditions associated with female genital organs and menstrual cycle: Secondary | ICD-10-CM | POA: Insufficient documentation

## 2017-01-25 DIAGNOSIS — M5136 Other intervertebral disc degeneration, lumbar region: Secondary | ICD-10-CM | POA: Diagnosis not present

## 2017-01-25 DIAGNOSIS — Z8572 Personal history of non-Hodgkin lymphomas: Secondary | ICD-10-CM | POA: Diagnosis present

## 2017-01-25 DIAGNOSIS — M48061 Spinal stenosis, lumbar region without neurogenic claudication: Secondary | ICD-10-CM | POA: Insufficient documentation

## 2017-01-25 DIAGNOSIS — M47896 Other spondylosis, lumbar region: Secondary | ICD-10-CM | POA: Diagnosis not present

## 2017-01-25 DIAGNOSIS — R590 Localized enlarged lymph nodes: Secondary | ICD-10-CM | POA: Diagnosis not present

## 2017-01-25 DIAGNOSIS — C8512 Unspecified B-cell lymphoma, intrathoracic lymph nodes: Secondary | ICD-10-CM | POA: Diagnosis not present

## 2017-01-25 DIAGNOSIS — M5431 Sciatica, right side: Secondary | ICD-10-CM | POA: Insufficient documentation

## 2017-01-25 DIAGNOSIS — N261 Atrophy of kidney (terminal): Secondary | ICD-10-CM | POA: Diagnosis not present

## 2017-01-25 DIAGNOSIS — I700 Atherosclerosis of aorta: Secondary | ICD-10-CM | POA: Insufficient documentation

## 2017-01-25 DIAGNOSIS — C8593 Non-Hodgkin lymphoma, unspecified, intra-abdominal lymph nodes: Secondary | ICD-10-CM

## 2017-01-25 DIAGNOSIS — K449 Diaphragmatic hernia without obstruction or gangrene: Secondary | ICD-10-CM | POA: Insufficient documentation

## 2017-01-25 DIAGNOSIS — K7689 Other specified diseases of liver: Secondary | ICD-10-CM | POA: Diagnosis not present

## 2017-01-25 DIAGNOSIS — M47816 Spondylosis without myelopathy or radiculopathy, lumbar region: Secondary | ICD-10-CM | POA: Diagnosis not present

## 2017-01-25 DIAGNOSIS — C8590 Non-Hodgkin lymphoma, unspecified, unspecified site: Secondary | ICD-10-CM | POA: Diagnosis not present

## 2017-01-25 LAB — GLUCOSE, CAPILLARY: GLUCOSE-CAPILLARY: 104 mg/dL — AB (ref 65–99)

## 2017-01-25 MED ORDER — FLUDEOXYGLUCOSE F - 18 (FDG) INJECTION
11.2400 | Freq: Once | INTRAVENOUS | Status: AC | PRN
  Administered 2017-01-25: 11.24 via INTRAVENOUS

## 2017-01-25 NOTE — Telephone Encounter (Signed)
Attempted call to pt cell to discuss plan to have bone marrow biopsy scheduled with IR to help determine diagnosis. Message sent Delle Reining, RN to f/u on Monday. Was unable to left voicemail on home or cell.

## 2017-01-28 ENCOUNTER — Other Ambulatory Visit: Payer: Self-pay | Admitting: Hematology and Oncology

## 2017-01-28 ENCOUNTER — Encounter: Payer: Self-pay | Admitting: Family Medicine

## 2017-01-28 ENCOUNTER — Ambulatory Visit (HOSPITAL_BASED_OUTPATIENT_CLINIC_OR_DEPARTMENT_OTHER): Payer: Medicare HMO | Admitting: Hematology and Oncology

## 2017-01-28 ENCOUNTER — Telehealth: Payer: Self-pay | Admitting: Hematology and Oncology

## 2017-01-28 VITALS — BP 105/58 | HR 110 | Temp 98.6°F | Resp 18 | Ht 64.0 in | Wt 229.9 lb

## 2017-01-28 DIAGNOSIS — M545 Low back pain, unspecified: Secondary | ICD-10-CM

## 2017-01-28 DIAGNOSIS — R197 Diarrhea, unspecified: Secondary | ICD-10-CM | POA: Diagnosis not present

## 2017-01-28 DIAGNOSIS — Z940 Kidney transplant status: Secondary | ICD-10-CM

## 2017-01-28 DIAGNOSIS — C8593 Non-Hodgkin lymphoma, unspecified, intra-abdominal lymph nodes: Secondary | ICD-10-CM | POA: Diagnosis not present

## 2017-01-28 DIAGNOSIS — R630 Anorexia: Secondary | ICD-10-CM

## 2017-01-28 MED ORDER — HYDROMORPHONE HCL 2 MG PO TABS: 1.0000 mg | ORAL_TABLET | ORAL | 0 refills | Status: DC | PRN

## 2017-01-28 MED ORDER — PROCHLORPERAZINE MALEATE 10 MG PO TABS: 10.0000 mg | ORAL_TABLET | Freq: Four times a day (QID) | ORAL | 0 refills | Status: DC | PRN

## 2017-01-28 NOTE — Assessment & Plan Note (Addendum)
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 Lymphoma was diagnosed in Dec 2015 and treated with dose-adjusted R-EPOCH with the patient receiving full 6 cycles of chemotherapy with 4 cycles of prophylactic intrathecal treatment. Treatment was conducted by [facility] system with complete response documented by imaging.  Recently, patient had presentation with recurrent abdominal pain, which led to CT of the abdomen with contrast and pain medications. Imaging revealed recurrent lymphadenopathy in the abdomen strongly suspicious for possible recurrent lymphoma. Presentation was complicated by development of delirium likely due to combination of severe pain, opioid pain medications, acute kidney injury precipitated by hyperuricemia and possibly contrast administration in a dehydrated patient. Delirium has resolved, patient continues to have significant amount of lower back pain likely due to significant degenerative changes noted on MRI of the spine recently. PET CT demonstrated presence of significant pathological hypermetabolic lymphadenopathy in the retroperitoneum with possible involvement of gastrointestinal tract. Gastrointestinal tract involvement with account for constipation diarrhea symptoms experienced by the patient. There is no radiographic evidence of involvement of the spine or neurological structures by the malignancy at this point in time.  Echocardiogram reveals preserved left ventricular ejection fraction.  Plan: --Change pain medication to hydromorphone 1 mg by mouth every 4 hours as needed. Our service will take over prescribing of the opioid and other controlled substances at this point in time and for the duration of lymphoma treatment as the patient will have more direct and intense contact with our clinic than with other services. Patient voices understanding of this  restriction on the opioid prescribing. --Proceed with bone marrow biopsy and CT-guided retroperitoneal LN Bx on 02/01/17 by IR --RTC on 02/06/17: We'll review results of the biopsy and discuss the choice of treatment at that time. Subsequently, patient will need placement of Infuse-a-Port and initiation of systemic chemotherapy.  Voice recognition software was used and creation of this note. Despite my best effort at editing the text, some misspelling/errors may have occurred.

## 2017-01-28 NOTE — Patient Instructions (Signed)
Thank you for choosing [facility] to provide your oncology and hematology care.  To afford each patient quality time with our providers, please arrive 30 minutes before your scheduled appointment time.  If you arrive late for your appointment, you may be asked to reschedule.  We strive to give you quality time with our providers, and arriving late affects you and other patients whose appointments are after yours.   If you are a no show for multiple scheduled visits, you may be dismissed from the clinic at the providers discretion.    Again, thank you for choosing [facility], our hope is that these requests will decrease the amount of time that you wait before being seen by our physicians.  ______________________________________________________________________  Should you have questions after your visit to the [facility], please contact our office at [phone] between the hours of 8:30 and 4:30 p.m.    Voicemails left after 4:30p.m will not be returned until the following business day.    For prescription refill requests, please have your pharmacy contact us directly.  Please also try to allow 48 hours for prescription requests.    Please contact the scheduling department for questions regarding scheduling.  For scheduling of procedures such as PET scans, CT scans, MRI, Ultrasound, etc please contact central scheduling at [phone].    Resources For Cancer Patients and Caregivers:   Oncolink.org:  A wonderful resource for patients and healthcare providers for information regarding your disease, ways to tract your treatment, what to expect, etc.     American Cancer Society:  800-227-2345  Can help patients locate various types of support and financial assistance  Cancer Care: 1-800-813-HOPE (4673) Provides financial assistance, online support groups, medication/co-pay assistance.    [place] DSS:  336-641-3447 Where to apply for food  stamps, Medicaid, and utility assistance  Medicare Rights Center: 800-333-4114 Helps people with Medicare understand their rights and benefits, navigate the Medicare system, and secure the quality healthcare they deserve  SCAT: 336-333-6589 [place] [employer]'s shared-ride transportation service for eligible riders who have a disability that prevents them from riding the fixed route bus.    For additional information on assistance programs please contact our social worker:   Grier Hock/Abigail Elmore:  [phone]            

## 2017-01-28 NOTE — Progress Notes (Signed)
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 Lymphoma was diagnosed in Dec 2015 and treated with dose-adjusted R-EPOCH with the patient receiving full 6 cycles of chemotherapy with 4 cycles of prophylactic intrathecal treatment. Treatment was conducted by [facility] system with complete response documented by imaging.  Recently, patient had presentation with recurrent abdominal pain, which led to CT of the abdomen with contrast and pain medications. Imaging revealed recurrent lymphadenopathy in the abdomen strongly suspicious for possible recurrent lymphoma. Presentation was complicated by development of delirium likely due to combination of severe pain, opioid pain medications, acute kidney injury precipitated by hyperuricemia and possibly contrast administration in a dehydrated patient. Delirium has resolved, patient continues to have significant amount of lower back pain likely due to significant degenerative changes noted on MRI of the spine recently. PET CT demonstrated presence of significant pathological hypermetabolic lymphadenopathy in the retroperitoneum with possible involvement of gastrointestinal tract. Gastrointestinal tract involvement with account for constipation diarrhea symptoms experienced by the patient. There is no radiographic evidence of involvement of the spine or neurological structures by the malignancy at this point in time.  Echocardiogram reveals preserved left ventricular ejection fraction.  Plan: --Change pain medication to hydromorphone 1 mg by mouth every 4 hours as needed. Our service will take over prescribing of the opioid and other controlled substances at this point in time and for the duration of lymphoma treatment as the patient will have more direct and  intense contact with our clinic than with other services. Patient voices understanding of this restriction on the opioid prescribing. --Proceed with bone marrow biopsy and CT-guided retroperitoneal LN Bx on 02/01/17 by IR --RTC on 02/06/17: We'll review results of the biopsy and discuss the choice of treatment at that time. Subsequently, patient will need placement of Infuse-a-Port and initiation of systemic chemotherapy.  Voice recognition software was used and creation of this note. Despite my best effort at editing the text, some misspelling/errors may have occurred.   No orders of the defined types were placed in this encounter.   All questions were answered.  . The patient knows to call the clinic with any problems, questions or concerns.  This note was electronically signed.    History of Presenting Illness Erica Woodward 71 y.o. presenting to the [facility] for monitoring and treatment of suspected recurrent lymphoma. In the interim, patient underwent imaging evaluation with of the lumbar spine which revealed no evidence of direct involvement of the vertebral column or neurological structures by the lymphoma as demonstrated significant degenerative arthritis with several levels of foraminal narrowing. PET CT obtained for disease staging demonstrates presence of extensive hypermetabolic lymphadenopathy and likely involvement of the bowel with the lymphoma process.  Main complaint is of limited by the patient is persistent and severe back pain which she rates 10 out of 10. Currently, back pain does not appear to be responding to hydrocodone with acetaminophen. Patient has diminished appetite, now having diarrhea with soft stools, no watery diarrhea at this time. She denies any fever, chills or night sweats. No new respiratory, neurological, or urinary complaints.  Oncological/hematological History:   NHL (non-Hodgkin's lymphoma) ([place])   06/25/2014 Initial Diagnosis    Diffuse large  B-cell lymphoma (followed and treated by Hessie Dibble, MD & Lovenia Shuck Despina Hidden, PA-C at [facility]); As per the last note: "She [  patient] presented to an outside hospital in December 2015 with worsening nausea, vomiting, and left lower quadrant abdominal pain. CT abdomen/ pelvis was concerning for peritoneal carcinomatosis. She was transferred to [facility] service for further management. A CT abdomen/ pelvis with contrast on 06/17/14 showed diffuse peritoneal metastatic disease, ascites, bilateral pleural effusions, and focal thickening of a loop of small bowel.   A paracentesis performed on 12/7 came back showing a high-grade malignant neoplasm favoring lymphoma. She underwent an open exploratory laparotomy and biopsy of the thickened omentum on 12/11. Pathology showed aggressive diffuse large B-cell lymphoma with Ki-67 of 100% that was strongly positive for CD20, CD10, and BCL-6 and negative for BCL-2. FISH studies were negative for MYC, BCL6, and IgH/BCL2 translocations. EBV PCR and HIV were negative. Bone marrow biopsy showed a mild CD20+ interstitial B-cell lymphocytosis. Labs showed an elevated LDH of 326 and anemia (Hgb 10.5). She did not have a PET CT scan at diagnosis as chemotherapy was immediately started as an inpatient. CSF showed atypical lymphocytes favoring benign reactive pleocytosis but flow cytometry was not performed. She reported a 40 lb weight loss as well as progressive night sweats over 3 months. Thus, she had stage IVB disease at presentation with IPI 5.      07/02/2014 - 11/01/2014 Chemotherapy    Dose Adjusted R-EPOCH: --Cycle #1, 07/02/14: Dose Level 1 --Cycle #2, 07/26/14: Dose Level 2 --Cycle #3, 08/16/14: Dose Level 2 --Cycle #4, 09/06/14: Dose Level 2 --Cycle #5, 09/27/14: Dose Level 2 --Cycle #6, 11/01/14: Dose Level 1 -- Delayed by 1 week due to poor tolerance to treatment and decline in her performance status. Dose level reduced to  1.  Completed IT chemotherapy x 4 doses.          09/20/2014 Imaging    External PET-CT (after 4 cycles of systemic therapy): No evidence of FDG avid recurrent or metastatic disease. Interval resolution of diffuse mesenteric edema and abdominopelvic ascites. Interval resolution of small bilateral pleural effusions. Diffuse metabolic activity in the bone marrow consistent with chemotherapy treatment. Right adnexal cyst measuring 3.3 cm. Recommend annual ultrasound surveillance for this lesion.        12/29/2016 Relapse/Recurrence    Patient presented with recurrent abdominal pain, which led to CT of the abdomen with contrast and pain medications. Imaging revealed recurrent lymphadenopathy in the abdomen strongly suspicious for possible recurrent lymphoma. Presentation was complicated by development of delirium likely due to combination of severe pain, opioid pain medications, acute kidney injury precipitated by hyperuricemia and possibly contrast administration in a dehydrated patient.  Patient required hospitalization, stabilized and improved significantly. Nevertheless, continues to have multiple problems not the least of which is persistent peripheral swelling, worsening performance status, dyspnea on exertion.      01/25/2017 Imaging    PET-CT: Intensely hypermetabolic retroperitoneal, pelvic, sigmoid mesocolon, and perirectal adenopathy compatible with malignancy. There is also a small but hypermetabolic paraesophageal lymph node in the thorax. Appearance compatible with active lymphoma.      01/25/2017 Imaging    MRI L-spine: Degenerative lumbar spondylosis with multilevel disc disease and facet disease. The most significant findings are at L4-5 where there is severe multifactorial spinal and bilateral lateral recess stenosis and significant right foraminal stenosis.       Medical History: Past Medical History:  Diagnosis Date  . Basal cell carcinoma   . Cancer [facility])     carcinomatosis  . Chronic headaches   . DDD (degenerative disc disease), lumbar    L4-5  right foraminal and spinal stenosis causng lumbar radiculopathy  . Diverticulosis   . GERD (gastroesophageal reflux disease)   . HLD (hyperlipidemia)   . Hx of adenomatous polyp of colon 05/21/2016  . Hypertension   . Renal disorder    kidney transplant 2005  . Status post dilation of esophageal narrowing     Surgical History: Past Surgical History:  Procedure Laterality Date  . COLONOSCOPY    . KIDNEY TRANSPLANT  07/26/2013  . LUMBAR PUNCTURE W/ INTRATHECAL CHEMOTHERAPY  08/19/14  . PORTACATH PLACEMENT    . UPPER GASTROINTESTINAL ENDOSCOPY      Family History: Family History  Problem Relation Age of Onset  . Kidney disease Father   . Diabetes Father   . Lung cancer Brother   . Seizures Brother   . Seizures Sister   . Breast cancer Daughter   . Heart disease Brother     Social History: Social History   Social History  . Marital status: Married    Spouse name: N/A  . Number of children: 4  . Years of education: N/A   Occupational History  . retired    Social History Main Topics  . Smoking status: Never Smoker  . Smokeless tobacco: Never Used  . Alcohol use No  . Drug use: No  . Sexual activity: Not on file   Other Topics Concern  . Not on file   Social History Narrative  . No narrative on file    Allergies: No Known Allergies  Medications:  Current Outpatient Prescriptions  Medication Sig Dispense Refill  . aspirin EC 81 MG tablet Take 81 mg by mouth daily.    Marland Kitchen atorvastatin (LIPITOR) 40 MG tablet TAKE 1 TABLET (40 MG TOTAL) BY MOUTH DAILY. 90 tablet 1  . cetirizine (ZYRTEC) 10 MG tablet Take 10 mg by mouth at bedtime.    Marland Kitchen esomeprazole (NEXIUM) 40 MG capsule Take 1 capsule (40 mg total) by mouth daily. 90 capsule 3  . furosemide (LASIX) 80 MG tablet Take 80 mg by mouth daily.     Marland Kitchen gabapentin (NEURONTIN) 300 MG capsule Take 1 capsule (300 mg total) by mouth 3  (three) times daily. (Patient taking differently: Take 900 mg by mouth 2 (two) times daily. ) 270 capsule 3  . magnesium oxide (MAG-OX) 400 MG tablet Take 400 mg by mouth 2 (two) times daily.     . ondansetron (ZOFRAN ODT) 4 MG disintegrating tablet Take 1 tablet (4 mg total) by mouth every 8 (eight) hours as needed for nausea or vomiting. 20 tablet 0  . potassium chloride SA (K-DUR,KLOR-CON) 20 MEQ tablet Take 40 mEq by mouth daily.     . predniSONE (DELTASONE) 10 MG tablet Take 10 mg by mouth daily with breakfast.    . predniSONE (DELTASONE) 20 MG tablet 3 tabs poqday 1-2, 2 tabs poqday 3-4, 1 tab poqday 5-6 12 tablet 0  . tacrolimus (PROGRAF) 0.5 MG capsule Take 1.5 mg by mouth 2 (two) times daily. Take 3 capsules (1.5 mg) BID    . HYDROmorphone (DILAUDID) 2 MG tablet Take 0.5 tablets (1 mg total) by mouth every 4 (four) hours as needed for severe pain. 30 tablet 0  . prochlorperazine (COMPAZINE) 10 MG tablet Take 1 tablet (10 mg total) by mouth every 6 (six) hours as needed for nausea or vomiting. 30 tablet 0   Current Facility-Administered Medications  Medication Dose Route Frequency Provider Last Rate Last Dose  . 0.9 %  sodium chloride infusion  500 mL Intravenous Continuous Gatha Mayer, MD        Review of Systems: Review of Systems  All other systems reviewed and are negative.    PHYSICAL EXAMINATION Blood pressure (!) 105/58, pulse (!) 110, temperature 98.6 F (37 C), temperature source Oral, resp. rate 18, height _0  (1.626 m), weight 229 lb 14.4 oz (104.3 kg), SpO2 96 %.  ECOG PERFORMANCE STATUS: 2 - Symptomatic, <50% confined to bed  Physical Exam  Constitutional: She is oriented to person, place, and time.  Patient appears fatigued.  HENT:  Head: Normocephalic and atraumatic.  Mouth/Throat: Oropharynx is clear and moist. No oropharyngeal exudate.  No oral thrush. No mucosal ulcers  Eyes: Pupils are equal, round, and reactive to light. Conjunctivae are normal. No  scleral icterus.  Neck: No tracheal deviation present. No thyromegaly present.  Cardiovascular: Normal rate and regular rhythm.   No murmur heard. Distant heart sounds  Pulmonary/Chest: Effort normal. No stridor. She has no wheezes.  Upper lungs are clear to auscultation. Lower lungs with bilateral dependent crackles and possible bones to percussion at the bases of the lungs.  Abdominal: Soft. She exhibits no distension and no mass. There is tenderness. There is no rebound and no guarding.  If no palpable tenderness in the left upper quadrant of the abdomen without rebound or peritoneal irritation. No palpable mass.  Musculoskeletal:  Tenderness to palpation over the lower lumbar spine and sacrum. No radiation down in the extremities.  Lymphadenopathy:       Head (right side): No submandibular and no occipital adenopathy present.       Head (left side): No submandibular and no occipital adenopathy present.    She has no cervical adenopathy.    She has no axillary adenopathy.       Right: No inguinal and no supraclavicular adenopathy present.       Left: No inguinal and no supraclavicular adenopathy present.  Neurological: She is oriented to person, place, and time. She has normal sensation, normal reflexes and intact cranial nerves.  Skin: She is not diaphoretic.     LABORATORY DATA: I have personally reviewed the data as listed: Hospital Outpatient Visit on 01/25/2017  Component Date Value Ref Range Status  . Glucose-Capillary 01/25/2017 104* 65 - 99 mg/dL Final       Ardath Sax, MD

## 2017-01-28 NOTE — Telephone Encounter (Signed)
Gave relative avs report and appointment for July.

## 2017-01-29 ENCOUNTER — Other Ambulatory Visit: Payer: Self-pay

## 2017-01-30 ENCOUNTER — Other Ambulatory Visit: Payer: Self-pay | Admitting: Radiology

## 2017-01-31 ENCOUNTER — Other Ambulatory Visit: Payer: Self-pay | Admitting: Physician Assistant

## 2017-02-01 ENCOUNTER — Ambulatory Visit (HOSPITAL_COMMUNITY)
Admission: RE | Admit: 2017-02-01 | Discharge: 2017-02-01 | Disposition: A | Discharge status: Home / Self Care | Payer: Medicare HMO | Source: Ambulatory Visit | Attending: Hematology and Oncology | Admitting: Hematology and Oncology

## 2017-02-01 ENCOUNTER — Other Ambulatory Visit: Payer: Self-pay | Admitting: Hematology and Oncology

## 2017-02-01 ENCOUNTER — Other Ambulatory Visit (HOSPITAL_COMMUNITY): Payer: Medicare HMO

## 2017-02-01 ENCOUNTER — Encounter (HOSPITAL_COMMUNITY): Payer: Self-pay

## 2017-02-01 DIAGNOSIS — C8593 Non-Hodgkin lymphoma, unspecified, intra-abdominal lymph nodes: Secondary | ICD-10-CM

## 2017-02-01 DIAGNOSIS — C8590 Non-Hodgkin lymphoma, unspecified, unspecified site: Secondary | ICD-10-CM | POA: Diagnosis not present

## 2017-02-01 DIAGNOSIS — M5116 Intervertebral disc disorders with radiculopathy, lumbar region: Secondary | ICD-10-CM | POA: Diagnosis not present

## 2017-02-01 DIAGNOSIS — C8513 Unspecified B-cell lymphoma, intra-abdominal lymph nodes: Secondary | ICD-10-CM | POA: Insufficient documentation

## 2017-02-01 DIAGNOSIS — Z85828 Personal history of other malignant neoplasm of skin: Secondary | ICD-10-CM | POA: Insufficient documentation

## 2017-02-01 DIAGNOSIS — M48061 Spinal stenosis, lumbar region without neurogenic claudication: Secondary | ICD-10-CM | POA: Diagnosis not present

## 2017-02-01 DIAGNOSIS — K219 Gastro-esophageal reflux disease without esophagitis: Secondary | ICD-10-CM | POA: Diagnosis not present

## 2017-02-01 DIAGNOSIS — Z8572 Personal history of non-Hodgkin lymphomas: Secondary | ICD-10-CM | POA: Diagnosis not present

## 2017-02-01 DIAGNOSIS — R296 Repeated falls: Secondary | ICD-10-CM | POA: Insufficient documentation

## 2017-02-01 DIAGNOSIS — Z801 Family history of malignant neoplasm of trachea, bronchus and lung: Secondary | ICD-10-CM | POA: Insufficient documentation

## 2017-02-01 DIAGNOSIS — R590 Localized enlarged lymph nodes: Secondary | ICD-10-CM | POA: Diagnosis not present

## 2017-02-01 DIAGNOSIS — Z86010 Personal history of colonic polyps: Secondary | ICD-10-CM | POA: Diagnosis not present

## 2017-02-01 DIAGNOSIS — D696 Thrombocytopenia, unspecified: Secondary | ICD-10-CM | POA: Diagnosis not present

## 2017-02-01 DIAGNOSIS — Z7952 Long term (current) use of systemic steroids: Secondary | ICD-10-CM | POA: Diagnosis not present

## 2017-02-01 DIAGNOSIS — D7589 Other specified diseases of blood and blood-forming organs: Secondary | ICD-10-CM | POA: Diagnosis not present

## 2017-02-01 DIAGNOSIS — E785 Hyperlipidemia, unspecified: Secondary | ICD-10-CM | POA: Insufficient documentation

## 2017-02-01 DIAGNOSIS — I10 Essential (primary) hypertension: Secondary | ICD-10-CM | POA: Diagnosis not present

## 2017-02-01 DIAGNOSIS — Z841 Family history of disorders of kidney and ureter: Secondary | ICD-10-CM | POA: Diagnosis not present

## 2017-02-01 DIAGNOSIS — Z833 Family history of diabetes mellitus: Secondary | ICD-10-CM | POA: Insufficient documentation

## 2017-02-01 DIAGNOSIS — Z940 Kidney transplant status: Secondary | ICD-10-CM | POA: Diagnosis not present

## 2017-02-01 DIAGNOSIS — Z803 Family history of malignant neoplasm of breast: Secondary | ICD-10-CM | POA: Insufficient documentation

## 2017-02-01 DIAGNOSIS — C8510 Unspecified B-cell lymphoma, unspecified site: Secondary | ICD-10-CM | POA: Diagnosis present

## 2017-02-01 LAB — CBC WITH DIFFERENTIAL/PLATELET
BASOS PCT: 0 %
Basophils Absolute: 0 10*3/uL (ref 0.0–0.1)
EOS ABS: 0.1 10*3/uL (ref 0.0–0.7)
Eosinophils Relative: 1 %
HCT: 37.7 % (ref 36.0–46.0)
HEMOGLOBIN: 12.7 g/dL (ref 12.0–15.0)
Lymphocytes Relative: 32 %
Lymphs Abs: 1.6 10*3/uL (ref 0.7–4.0)
MCH: 30.4 pg (ref 26.0–34.0)
MCHC: 33.7 g/dL (ref 30.0–36.0)
MCV: 90.2 fL (ref 78.0–100.0)
MONOS PCT: 11 %
Monocytes Absolute: 0.6 10*3/uL (ref 0.1–1.0)
NEUTROS PCT: 56 %
Neutro Abs: 2.9 10*3/uL (ref 1.7–7.7)
PLATELETS: 76 10*3/uL — AB (ref 150–400)
RBC: 4.18 MIL/uL (ref 3.87–5.11)
RDW: 14.1 % (ref 11.5–15.5)
WBC: 5.2 10*3/uL (ref 4.0–10.5)

## 2017-02-01 LAB — PROTIME-INR
INR: 1.1
PROTHROMBIN TIME: 14.2 s (ref 11.4–15.2)

## 2017-02-01 LAB — APTT: APTT: 28 s (ref 24–36)

## 2017-02-01 MED ORDER — MIDAZOLAM HCL 2 MG/2ML IJ SOLN
INTRAMUSCULAR | Status: AC | PRN
  Administered 2017-02-01 (×2): 1 mg via INTRAVENOUS

## 2017-02-01 MED ORDER — SODIUM CHLORIDE 0.9 % IV SOLN
INTRAVENOUS | Status: DC
  Administered 2017-02-01: 08:00:00 via INTRAVENOUS

## 2017-02-01 MED ORDER — FENTANYL CITRATE (PF) 100 MCG/2ML IJ SOLN
INTRAMUSCULAR | Status: AC | PRN
  Administered 2017-02-01 (×2): 50 ug via INTRAVENOUS

## 2017-02-01 MED ORDER — FLUMAZENIL 0.5 MG/5ML IV SOLN
INTRAVENOUS | Status: AC
  Filled 2017-02-01: qty 5

## 2017-02-01 MED ORDER — LIDOCAINE HCL (PF) 1 % IJ SOLN
INTRAMUSCULAR | Status: AC | PRN
  Administered 2017-02-01: 20 mL via INTRADERMAL

## 2017-02-01 MED ORDER — MIDAZOLAM HCL 2 MG/2ML IJ SOLN
INTRAMUSCULAR | Status: AC
  Filled 2017-02-01: qty 4

## 2017-02-01 MED ORDER — NALOXONE HCL 0.4 MG/ML IJ SOLN
INTRAMUSCULAR | Status: AC
  Filled 2017-02-01: qty 1

## 2017-02-01 MED ORDER — FENTANYL CITRATE (PF) 100 MCG/2ML IJ SOLN
INTRAMUSCULAR | Status: AC
  Filled 2017-02-01: qty 4

## 2017-02-01 NOTE — Procedures (Signed)
Interventional Radiology Procedure Note  Procedure: CT guided biopsy of bone marrow with aspirate CT guided left para-aortic lymph node biopsy.  .  Complications: None Recommendations:  - Ok to shower tomorrow - Do not submerge for 7 days - 1 hour recovery - follow up pathology - Routine line care   Signed,  Dulcy Fanny. Earleen Newport, DO

## 2017-02-01 NOTE — H&P (Signed)
Chief Complaint: B-cell lymphoma  Referring Physician:Dr. Grace Isaac  Supervising Physician: Corrie Mckusick  Patient Status: [facility] - Out-pt  HPI: Erica Woodward is a 71 y.o. female who has history of renal transplant taking chronic prednisone.  She has been found to have lymphoma in the past and received chemotherapy for this in 2015.  It if felt that she may have recurrent lymphoma.  A request has been made for a bone marrow biopsy as well as a para aortic LN BX as well.  The patient states she doesn't remember much about her medical history, but she does state she has had some new SOB over the last month.  She has been put on "fluid pills."  Her daughter states she does not use her cane or walker and has had several recent falls.  She otherwise denies chest pain, fevers, or chills.  Past Medical History:  Past Medical History:  Diagnosis Date  . Basal cell carcinoma   . Cancer [facility])    carcinomatosis  . Chronic headaches   . DDD (degenerative disc disease), lumbar    L4-5 right foraminal and spinal stenosis causng lumbar radiculopathy  . Diverticulosis   . GERD (gastroesophageal reflux disease)   . HLD (hyperlipidemia)   . Hx of adenomatous polyp of colon 05/21/2016  . Hypertension   . Renal disorder    kidney transplant 2005  . Status post dilation of esophageal narrowing     Past Surgical History:  Past Surgical History:  Procedure Laterality Date  . COLONOSCOPY    . KIDNEY TRANSPLANT  07/26/2013  . LUMBAR PUNCTURE W/ INTRATHECAL CHEMOTHERAPY  08/19/14  . PORTACATH PLACEMENT    . UPPER GASTROINTESTINAL ENDOSCOPY      Family History:  Family History  Problem Relation Age of Onset  . Kidney disease Father   . Diabetes Father   . Lung cancer Brother   . Seizures Brother   . Seizures Sister   . Breast cancer Daughter   . Heart disease Brother     Social History:  reports that she has never smoked. She has never used smokeless tobacco. She reports that she does  not drink alcohol or use drugs.  Allergies: No Known Allergies  Medications: Medications reviewed in epic  Please HPI for pertinent positives, otherwise complete 10 system ROS negative.  Mallampati Score: MD Evaluation Airway: WNL Heart: WNL Abdomen: WNL Chest/ Lungs: WNL ASA  Classification: 3 Mallampati/Airway Score: Two  Physical Exam: BP (!) 147/73   Pulse (!) 103   Temp (!) 97.5 F (36.4 C) (Oral)   Resp 20   Ht '5\' 4"'  (1.626 m)   Wt 229 lb 14.4 oz (104.3 kg)   SpO2 97%   BMI 39.46 kg/m  Body mass index is 39.46 kg/m. General: pleasant, obese white female who is laying in bed in NAD HEENT: head is normocephalic, atraumatic.  Sclera are noninjected.  PERRL.  Ears and nose without any masses or lesions.  Mouth is pink and moist Heart: regular, rate, and rhythm.  Normal s1,s2. No obvious murmurs, gallops, or rubs noted.  Palpable radial and pedal pulses bilaterally Lungs: CTAB, no wheezes, rhonchi, or rales noted.  Respiratory effort nonlabored Abd: soft, NT, ND, +BS, no masses, hernias, or organomegaly Psych: A&Ox3 with an appropriate affect.   Labs: Results for orders placed or performed during the hospital encounter of 02/01/17 (from the past 48 hour(s))  CBC with Differential/Platelet     Status: Abnormal   Collection Time: 02/01/17  7:10 AM  Result Value Ref Range   WBC 5.2 4.0 - 10.5 K/uL   RBC 4.18 3.87 - 5.11 MIL/uL   Hemoglobin 12.7 12.0 - 15.0 g/dL   HCT 37.7 36.0 - 46.0 %   MCV 90.2 78.0 - 100.0 fL   MCH 30.4 26.0 - 34.0 pg   MCHC 33.7 30.0 - 36.0 g/dL   RDW 14.1 11.5 - 15.5 %   Platelets 76 (L) 150 - 400 K/uL    Comment: REPEATED TO VERIFY SPECIMEN CHECKED FOR CLOTS PLATELET COUNT CONFIRMED BY SMEAR    Neutrophils Relative % 56 %   Neutro Abs 2.9 1.7 - 7.7 K/uL   Lymphocytes Relative 32 %   Lymphs Abs 1.6 0.7 - 4.0 K/uL   Monocytes Relative 11 %   Monocytes Absolute 0.6 0.1 - 1.0 K/uL   Eosinophils Relative 1 %   Eosinophils Absolute 0.1 0.0  - 0.7 K/uL   Basophils Relative 0 %   Basophils Absolute 0.0 0.0 - 0.1 K/uL  APTT upon arrival     Status: None   Collection Time: 02/01/17  7:10 AM  Result Value Ref Range   aPTT 28 24 - 36 seconds  Protime-INR upon arrival     Status: None   Collection Time: 02/01/17  7:10 AM  Result Value Ref Range   Prothrombin Time 14.2 11.4 - 15.2 seconds   INR 1.10     Imaging: No results found.  Assessment/Plan 1. B-cell lymphoma  We will plan to proceed today with a bone marrow biopsy as well as a biopsy of a para aortic lymph node.  Her labs and vitals have been reviewed. Risks and Benefits discussed with the patient including, but not limited to bleeding, infection, damage to adjacent structures or low yield requiring additional tests. All of the patient's questions were answered, patient is agreeable to proceed. Consent signed and in chart.   Thank you for this interesting consult.  I greatly enjoyed meeting Heily Carlucci and look forward to participating in their care.  A copy of this report was sent to the requesting provider on this date.  Electronically Signed: Henreitta Cea 02/01/2017, 8:38 AM   I spent a total of  30 Minutes   in face to face in clinical consultation, greater than 50% of which was counseling/coordinating care for b-cell lymphoma

## 2017-02-01 NOTE — Discharge Instructions (Signed)
Bone Marrow Aspiration and Bone Marrow Biopsy, Adult, Care After This sheet gives you information about how to care for yourself after your procedure. Your health care provider may also give you more specific instructions. If you have problems or questions, contact your health care provider. What can I expect after the procedure? After the procedure, it is common to have:  Mild pain and tenderness.  Swelling.  Bruising.  Follow these instructions at home:  Take over-the-counter or prescription medicines only as told by your health care provider.  Do not take baths, swim, or use a hot tub until your health care provider approves. Ask if you can take a shower or have a sponge bath.  Follow instructions from your health care provider about how to take care of the puncture site. Make sure you: ? Wash your hands with soap and water before you change your bandage (dressing). If soap and water are not available, use hand sanitizer. ? Change your dressing as told by your health care provider.  Check your puncture siteevery day for signs of infection. Check for: ? More redness, swelling, or pain. ? More fluid or blood. ? Warmth. ? Pus or a bad smell.  Return to your normal activities as told by your health care provider. Ask your health care provider what activities are safe for you.  Do not drive for 24 hours if you were given a medicine to help you relax (sedative).  Keep all follow-up visits as told by your health care provider. This is important. Contact a health care p Moderate Conscious Sedation, Adult, Care After These instructions provide you with information about caring for yourself after your procedure. Your health care provider may also give you more specific instructions. Your treatment has been planned according to current medical practices, but problems sometimes occur. Call your health care provider if you have any problems or questions after your procedure. What can I  expect after the procedure? After your procedure, it is common:  To feel sleepy for several hours.  To feel clumsy and have poor balance for several hours.  To have poor judgment for several hours.  To vomit if you eat too soon.  Follow these instructions at home: For at least 24 hours after the procedure:   Do not: ? Participate in activities where you could fall or become injured. ? Drive. ? Use heavy machinery. ? Drink alcohol. ? Take sleeping pills or medicines that cause drowsiness. ? Make important decisions or sign legal documents. ? Take care of children on your own.  Rest. Eating and drinking  Follow the diet recommended by your health care provider.  If you vomit: ? Drink water, juice, or soup when you can drink without vomiting. ? Make sure you have little or no nausea before eating solid foods. General instructions  Have a responsible adult stay with you until you are awake and alert.  Take over-the-counter and prescription medicines only as told by your health care provider.  If you smoke, do not smoke without supervision.  Keep all follow-up visits as told by your health care provider. This is important. Contact a health care provider if:  You keep feeling nauseous or you keep vomiting.  You feel light-headed.  You develop a rash.  You have a fever. Get help right away if:  You have trouble breathing. This information is not intended to replace advice given to you by your health care provider. Make sure you discuss any questions you have with your  health care provider. Document Released: 04/22/2013 Document Revised: 12/05/2015 Document Reviewed: 10/22/2015 Elsevier Interactive Patient Education  2018 [employer]. rovider if:  You have more redness, swelling, or pain around the puncture site.  You have more fluid or blood coming from the puncture site.  Your puncture site feels warm to the touch.  You have pus or a bad smell coming from  the puncture site.  You have a fever.  Your pain is not controlled with medicine. This information is not intended to replace advice given to you by your health care provider. Make sure you discuss any questions you have with your health care provider. Document Released: 01/19/2005 Document Revised: 01/20/2016 Document Reviewed: 12/14/2015 Elsevier Interactive Patient Education  2018 [employer].

## 2017-02-04 ENCOUNTER — Emergency Department (HOSPITAL_COMMUNITY)
Admission: EM | Admit: 2017-02-04 | Discharge: 2017-02-05 | Disposition: A | Discharge status: Home / Self Care | Payer: Medicare HMO | Attending: Emergency Medicine | Admitting: Emergency Medicine

## 2017-02-04 ENCOUNTER — Emergency Department (HOSPITAL_COMMUNITY): Payer: Medicare HMO

## 2017-02-04 ENCOUNTER — Encounter (HOSPITAL_COMMUNITY): Payer: Self-pay | Admitting: Emergency Medicine

## 2017-02-04 DIAGNOSIS — R112 Nausea with vomiting, unspecified: Secondary | ICD-10-CM | POA: Diagnosis not present

## 2017-02-04 DIAGNOSIS — R1032 Left lower quadrant pain: Secondary | ICD-10-CM | POA: Insufficient documentation

## 2017-02-04 DIAGNOSIS — I10 Essential (primary) hypertension: Secondary | ICD-10-CM | POA: Insufficient documentation

## 2017-02-04 DIAGNOSIS — R1111 Vomiting without nausea: Secondary | ICD-10-CM | POA: Diagnosis not present

## 2017-02-04 DIAGNOSIS — N179 Acute kidney failure, unspecified: Secondary | ICD-10-CM | POA: Insufficient documentation

## 2017-02-04 DIAGNOSIS — R197 Diarrhea, unspecified: Secondary | ICD-10-CM | POA: Diagnosis not present

## 2017-02-04 DIAGNOSIS — Z8572 Personal history of non-Hodgkin lymphomas: Secondary | ICD-10-CM | POA: Diagnosis not present

## 2017-02-04 DIAGNOSIS — Z79899 Other long term (current) drug therapy: Secondary | ICD-10-CM | POA: Insufficient documentation

## 2017-02-04 DIAGNOSIS — Z7982 Long term (current) use of aspirin: Secondary | ICD-10-CM | POA: Diagnosis not present

## 2017-02-04 LAB — CBC
HCT: 35 % — ABNORMAL LOW (ref 36.0–46.0)
Hemoglobin: 11.8 g/dL — ABNORMAL LOW (ref 12.0–15.0)
MCH: 30.3 pg (ref 26.0–34.0)
MCHC: 33.7 g/dL (ref 30.0–36.0)
MCV: 90 fL (ref 78.0–100.0)
PLATELETS: 85 10*3/uL — AB (ref 150–400)
RBC: 3.89 MIL/uL (ref 3.87–5.11)
RDW: 14.3 % (ref 11.5–15.5)
WBC: 4.7 10*3/uL (ref 4.0–10.5)

## 2017-02-04 LAB — COMPREHENSIVE METABOLIC PANEL
ALK PHOS: 72 U/L (ref 38–126)
ALT: 13 U/L — AB (ref 14–54)
AST: 20 U/L (ref 15–41)
Albumin: 3.4 g/dL — ABNORMAL LOW (ref 3.5–5.0)
Anion gap: 9 (ref 5–15)
BILIRUBIN TOTAL: 1.2 mg/dL (ref 0.3–1.2)
BUN: 22 mg/dL — AB (ref 6–20)
CALCIUM: 9 mg/dL (ref 8.9–10.3)
CO2: 23 mmol/L (ref 22–32)
CREATININE: 1.41 mg/dL — AB (ref 0.44–1.00)
Chloride: 105 mmol/L (ref 101–111)
GFR, EST AFRICAN AMERICAN: 43 mL/min — AB (ref >60)
GFR, EST NON AFRICAN AMERICAN: 37 mL/min — AB (ref >60)
Glucose, Bld: 108 mg/dL — ABNORMAL HIGH (ref 65–99)
Potassium: 4.9 mmol/L (ref 3.5–5.1)
Sodium: 137 mmol/L (ref 135–145)
Total Protein: 6 g/dL — ABNORMAL LOW (ref 6.5–8.1)

## 2017-02-04 LAB — URINALYSIS, ROUTINE W REFLEX MICROSCOPIC
BILIRUBIN URINE: NEGATIVE
Glucose, UA: NEGATIVE mg/dL
Hgb urine dipstick: NEGATIVE
Ketones, ur: 5 mg/dL — AB
Nitrite: NEGATIVE
PH: 6 (ref 5.0–8.0)
Protein, ur: NEGATIVE mg/dL
SPECIFIC GRAVITY, URINE: 1.008 (ref 1.005–1.030)

## 2017-02-04 LAB — LIPASE, BLOOD: Lipase: <10 U/L — ABNORMAL LOW (ref 11–51)

## 2017-02-04 MED ORDER — IOPAMIDOL (ISOVUE-300) INJECTION 61%
INTRAVENOUS | Status: AC
  Filled 2017-02-04: qty 30

## 2017-02-04 MED ORDER — MORPHINE SULFATE (PF) 2 MG/ML IV SOLN
4.0000 mg | Freq: Once | INTRAVENOUS | Status: AC
  Administered 2017-02-04: 4 mg via INTRAVENOUS
  Filled 2017-02-04: qty 2

## 2017-02-04 MED ORDER — ONDANSETRON HCL 4 MG/2ML IJ SOLN
4.0000 mg | Freq: Once | INTRAMUSCULAR | Status: AC
  Administered 2017-02-04: 4 mg via INTRAVENOUS
  Filled 2017-02-04: qty 2

## 2017-02-04 MED ORDER — SODIUM CHLORIDE 0.9 % IV BOLUS (SEPSIS)
1000.0000 mL | Freq: Once | INTRAVENOUS | Status: AC
  Administered 2017-02-04: 1000 mL via INTRAVENOUS

## 2017-02-04 NOTE — ED Triage Notes (Addendum)
Pt reports that she has been having diarrhea for the last several days and believes it may be a small bowel obstruction. Pt reports also vomiting after trying to eat or drink. Pt reports dx of Nonhodgkins lymphoma not currently on chemo or radiation.

## 2017-02-04 NOTE — ED Notes (Signed)
Patient transported to CT 

## 2017-02-04 NOTE — ED Provider Notes (Signed)
[facility] DEPT Provider Note   CSN: [id number] Arrival date & time: 02/04/17  1910     History   Chief Complaint Chief Complaint  Patient presents with  . Diarrhea  . Emesis    HPI Erica Woodward is a 71 y.o. female.  The history is provided by the patient.  Abdominal Pain   This is a recurrent problem. Episode onset: 1 week. The problem occurs constantly. The problem has been rapidly worsening. The pain is located in the LLQ (left flank). The quality of the pain is aching and cramping. The pain is moderate. Associated symptoms include diarrhea, nausea and vomiting (decreased PO tolerance). Pertinent negatives include fever, hematochezia, melena, constipation and dysuria. The symptoms are aggravated by certain positions. Nothing relieves the symptoms. Past medical history comments: h/o NHL s/p recent intraperitoneal LAD Bx. H/O SBO.    Past Medical History:  Diagnosis Date  . Basal cell carcinoma   . Cancer [facility])    carcinomatosis  . Chronic headaches   . DDD (degenerative disc disease), lumbar    L4-5 right foraminal and spinal stenosis causng lumbar radiculopathy  . Diverticulosis   . GERD (gastroesophageal reflux disease)   . HLD (hyperlipidemia)   . Hx of adenomatous polyp of colon 05/21/2016  . Hypertension   . Renal disorder    kidney transplant 2005  . Status post dilation of esophageal narrowing     Patient Active Problem List   Diagnosis Date Noted  . Shortness of breath 01/08/2017  . Anasarca 01/08/2017  . History of B-cell lymphoma 01/08/2017  . ARF (acute renal failure) ([place]) 12/31/2016  . Hx of adenomatous polyp of colon 05/21/2016  . NHL (non-Hodgkin's lymphoma) ([place]) 08/02/2014  . GERD (gastroesophageal reflux disease) 06/16/2014  . Renal transplant recipient 07/21/2010  . HLD (hyperlipidemia) 01/17/2010  . GOUT 01/17/2010  . Essential hypertension 01/17/2010  . KNEE INJURY, RIGHT 01/17/2010    Past Surgical History:  Procedure Laterality  Date  . COLONOSCOPY    . KIDNEY TRANSPLANT  07/26/2013  . LUMBAR PUNCTURE W/ INTRATHECAL CHEMOTHERAPY  08/19/14  . PORTACATH PLACEMENT    . UPPER GASTROINTESTINAL ENDOSCOPY      OB History    No data available       Home Medications    Prior to Admission medications   Medication Sig Start Date End Date Taking? Authorizing Provider  aspirin EC 81 MG tablet Take 81 mg by mouth daily.   Yes [provider]  atorvastatin (LIPITOR) 40 MG tablet TAKE 1 TABLET (40 MG TOTAL) BY MOUTH DAILY. 09/25/16  Yes Susy Frizzle, MD  cetirizine (ZYRTEC) 10 MG tablet Take 10 mg by mouth at bedtime.   Yes [provider]  esomeprazole (NEXIUM) 40 MG capsule Take 1 capsule (40 mg total) by mouth daily. 04/18/16  Yes Susy Frizzle, MD  furosemide (LASIX) 80 MG tablet Take 40 mg by mouth daily.    Yes [provider]  gabapentin (NEURONTIN) 300 MG capsule Take 1 capsule (300 mg total) by mouth 3 (three) times daily. Patient taking differently: Take 900 mg by mouth 2 (two) times daily.  10/03/16  Yes Susy Frizzle, MD  HYDROmorphone (DILAUDID) 2 MG tablet Take 0.5 tablets (1 mg total) by mouth every 4 (four) hours as needed for severe pain. 01/28/17 02/12/17 Yes Perlov, Marinell Blight, MD  magnesium oxide (MAG-OX) 400 MG tablet Take 400 mg by mouth 2 (two) times daily.    Yes [provider]  potassium chloride  SA (K-DUR,KLOR-CON) 20 MEQ tablet Take 40 mEq by mouth daily.    Yes [provider]  predniSONE (DELTASONE) 10 MG tablet Take 10 mg by mouth daily with breakfast.   Yes [provider]  prochlorperazine (COMPAZINE) 10 MG tablet Take 1 tablet (10 mg total) by mouth every 6 (six) hours as needed for nausea or vomiting. 01/28/17  Yes Perlov, Marinell Blight, MD  tacrolimus (PROGRAF) 0.5 MG capsule Take 1.5 mg by mouth 2 (two) times daily. Take 3 capsules (1.5 mg) BID 06/28/14  Yes [provider]  ondansetron (ZOFRAN ODT) 4 MG disintegrating tablet  Take 1 tablet (4 mg total) by mouth every 8 (eight) hours as needed for nausea or vomiting. 02/05/17 02/08/17  Fatima Blank, MD  predniSONE (DELTASONE) 20 MG tablet 3 tabs poqday 1-2, 2 tabs poqday 3-4, 1 tab poqday 5-6 Patient not taking: Reported on 02/04/2017 01/14/17   Susy Frizzle, MD    Family History Family History  Problem Relation Age of Onset  . Kidney disease Father   . Diabetes Father   . Lung cancer Brother   . Seizures Brother   . Seizures Sister   . Breast cancer Daughter   . Heart disease Brother     Social History Social History  Substance Use Topics  . Smoking status: Never Smoker  . Smokeless tobacco: Never Used  . Alcohol use No     Allergies   Patient has no known allergies.   Review of Systems Review of Systems  Constitutional: Negative for fever.  Gastrointestinal: Positive for abdominal pain, diarrhea, nausea and vomiting (decreased PO tolerance). Negative for constipation, hematochezia and melena.  Genitourinary: Negative for dysuria.  All other systems are reviewed and are negative for acute change except as noted in the HPI    Physical Exam Updated Vital Signs BP (!) 141/79 (BP Location: Right Arm)   Pulse 88   Temp 97.7 F (36.5 C) (Oral)   Resp 20   Ht 5\' 4"  (1.626 m)   Wt 103.9 kg (229 lb)   SpO2 100%   BMI 39.31 kg/m   Physical Exam  Constitutional: She is oriented to person, place, and time. She appears well-developed and well-nourished. No distress.  HENT:  Head: Normocephalic and atraumatic.  Nose: Nose normal.  Eyes: Pupils are equal, round, and reactive to light. Conjunctivae and EOM are normal. Right eye exhibits no discharge. Left eye exhibits no discharge. No scleral icterus.  Neck: Normal range of motion. Neck supple.  Cardiovascular: Normal rate and regular rhythm.  Exam reveals no gallop and no friction rub.   No murmur heard. Pulmonary/Chest: Effort normal and breath sounds normal. No stridor. No  respiratory distress. She has no rales.  Abdominal: Soft. She exhibits no distension. There is tenderness in the left lower quadrant. There is no rigidity, no rebound, no guarding and no CVA tenderness. No hernia.  Musculoskeletal: She exhibits no edema or tenderness.  Neurological: She is alert and oriented to person, place, and time.  Skin: Skin is warm and dry. No rash noted. She is not diaphoretic. No erythema.  Psychiatric: She has a normal mood and affect.  Vitals reviewed.    ED Treatments / Results  Labs (all labs ordered are listed, but only abnormal results are displayed) Labs Reviewed  LIPASE, BLOOD - Abnormal; Notable for the following:       Result Value   Lipase <10 (*)    All other components within normal limits  COMPREHENSIVE  METABOLIC PANEL - Abnormal; Notable for the following:    Glucose, Bld 108 (*)    BUN 22 (*)    Creatinine, Ser 1.41 (*)    Total Protein 6.0 (*)    Albumin 3.4 (*)    ALT 13 (*)    GFR calc non Af Amer 37 (*)    GFR calc Af Amer 43 (*)    All other components within normal limits  CBC - Abnormal; Notable for the following:    Hemoglobin 11.8 (*)    HCT 35.0 (*)    Platelets 85 (*)    All other components within normal limits  URINALYSIS, ROUTINE W REFLEX MICROSCOPIC - Abnormal; Notable for the following:    Ketones, ur 5 (*)    Leukocytes, UA LARGE (*)    Bacteria, UA RARE (*)    Squamous Epithelial / LPF 0-5 (*)    All other components within normal limits    EKG  EKG Interpretation None       Radiology Ct Abdomen Pelvis Wo Contrast  Result Date: 02/04/2017 CLINICAL DATA:  Abdominal pain.  Diarrhea.  Vomiting. EXAM: CT ABDOMEN AND PELVIS WITHOUT CONTRAST TECHNIQUE: Multidetector CT imaging of the abdomen and pelvis was performed following the standard protocol without IV contrast. COMPARISON:  PET-CT 01/25/2017 FINDINGS: Lower chest: Motion artifact. Scattered atelectasis. No confluent consolidation or pleural fluid. Upper  normal heart size with coronary artery calcifications. A lymph node adjacent to the distal esophagus measures 5 mm, previously 4 mm. Hepatobiliary: Unchanged cyst in the left lobe of the liver. Gallbladder physiologically distended, no calcified stone. No biliary dilatation. Pancreas: Parenchymal atrophy. No ductal dilatation or inflammation. Spleen: Normal in size without focal abnormality. Adrenals/Urinary Tract: No adrenal nodule. Native kidneys are markedly atrophic. Transplant kidney in the right lower quadrant without transplant hydronephrosis. Urinary bladder is physiologically distended. No bladder wall thickening. Stomach/Bowel: Small hiatal hernia. Stomach physiologically distended. Enteric contrast within normal caliber small bowel, just reaching the cecum. No bowel obstruction. Small volume of colonic stool. Sigmoid colonic diverticulosis without acute diverticulitis. No liquid stool. No colonic inflammation or wall thickening. Vascular/Lymphatic: Atherosclerosis of the abdominal aorta without aneurysm. Rather extensive retrocrural, retroperitoneal, bilateral common ileum cool an left external iliac adenopathy, as characterized on recent PET. Small presacral node measures 11 mm, unchanged additional small perirectal nodes are also unchanged. Minimal stranding about enlarged left periaortic nodes likely related to recent biopsy. Some of the stranding tracks along the left iliopsoas muscle anteriorly with small volume of fluid. Reproductive: A 2.9 X 2.3 cm right adnexal cystic structure is unchanged, not hypermetabolic on prior PET. Uterus is atrophic, normal for age. Other: No free air or ascites. Musculoskeletal: Degenerative change in the lumbar spine. There are no acute or suspicious osseous abnormalities. No evidence of adverse sequela post recent iliac bone biopsy. IMPRESSION: 1. Mild left retroperitoneal stranding/fluid about the left psoas muscle and periaortic lymph nodes, likely sequela of recent  retroperitoneal lymph node biopsy. 2. Rather extensive retroperitoneal, retrocrural and left iliac adenopathy, and mild perirectal/presacral adenopathy as characterized on recent PET. 3. No bowel obstruction, enteric contrast reaches the colon. Colonic diverticulosis without diverticulitis. 4. Atrophic native kidneys with right lower quadrant renal transplant, no transplant hydronephrosis. 5. Aortic atherosclerosis. Electronically Signed   By: Jeb Levering M.D.   On: 02/04/2017 23:54    Procedures Procedures (including critical care time)  Medications Ordered in ED Medications  iopamidol (ISOVUE-300) 61 % injection (not administered)  sodium chloride 0.9 % bolus 1,000  mL (0 mLs Intravenous Stopped 02/04/17 2238)  ondansetron (ZOFRAN) injection 4 mg (4 mg Intravenous Given 02/04/17 2125)  morphine 2 MG/ML injection 4 mg (4 mg Intravenous Given 02/04/17 2126)     Initial Impression / Assessment and Plan / ED Course  I have reviewed the triage vital signs and the nursing notes.  Pertinent labs & imaging results that were available during my care of the patient were reviewed by me and considered in my medical decision making (see chart for details).     Labs grossly reassuring. CT scan without evidence of small bowel obstruction or other inflammatory/infectious process. Pain likely secondary to recent intraperitoneal biopsy.   Nausea, vomiting, diarrhea may be viral process. No evidence suggesting a C. difficile or other serious bacterial infection.  Patient given IV fluids and nausea medicine. Improved symptomatology and able to tolerate by mouth intake.  The patient is safe for discharge with strict return precautions.   Final Clinical Impressions(s) / ED Diagnoses   Final diagnoses:  Nausea vomiting and diarrhea  Left lower quadrant pain   Disposition: Discharge  Condition: Good  I have discussed the results, Dx and Tx plan with the patient who expressed understanding and  agree(s) with the plan. Discharge instructions discussed at great length. The patient was given strict return precautions who verbalized understanding of the instructions. No further questions at time of discharge.    New Prescriptions   ONDANSETRON (ZOFRAN ODT) 4 MG DISINTEGRATING TABLET    Take 1 tablet (4 mg total) by mouth every 8 (eight) hours as needed for nausea or vomiting.    Follow Up: Susy Frizzle, MD [address] [place] [address] [phone]  Schedule an appointment as soon as possible for a visit  If symptoms do not improve or  worsen in 3-5 days      Cardama, Grayce Sessions, MD 02/05/17 [phone]

## 2017-02-05 ENCOUNTER — Telehealth: Payer: Self-pay | Admitting: *Deleted

## 2017-02-05 MED ORDER — ONDANSETRON 4 MG PO TBDP: 4.0000 mg | ORAL_TABLET | Freq: Three times a day (TID) | ORAL | 0 refills | Status: DC | PRN

## 2017-02-05 NOTE — Telephone Encounter (Signed)
Received call from pt's daughter Letta Median. Letta Median states that her mother was in the ED last night for nausea/vomiting and diarrhea as well as left flank pain.  Pt received IV pain meds, IV zofran and IV  Fluids and was feeling better-discharged back home.  Daughter states pt is still feeling poorly-mostly with nausea and vomiting. Letta Median is also concerned about increased creatinine as pt has had a kidney transplant. Reviewed ED visit and labs/CT scan with Dr. Lebron Conners.  Per Dr. Candis Shine pt continue with Zofran ODT every 8 hours. If not effective pt has compazine as well at home and she can use that every 6 hours-alternating with zofran or use alone if compazine is more effective.  As far as pain control-pt can use a whole tablet of dilaudid instead of a half tab every 4 hours as needed.  As nausea gets under control, then she needs to increase oral fluids to keep hydrated.  Daughter states pt has not had diarrhea today.  Advised daughter to add some crackers to her intake as well as fluids-helps with nausea.  Daughter follow up with pt's nephrologist related to kidney function.  If pt no better, then she should return to ED. Pt scheduled to see Dr. Lebron Conners tomorrow to go over biopsy results.   Made daughter aware that results have not come back yet but may be back by tomorrow.  Daughter plans on keeping appt tomorrow.  Daughter knows to call if further issues arise or pt not feeling better.

## 2017-02-06 ENCOUNTER — Telehealth: Payer: Self-pay

## 2017-02-06 ENCOUNTER — Ambulatory Visit (HOSPITAL_BASED_OUTPATIENT_CLINIC_OR_DEPARTMENT_OTHER): Payer: Medicare HMO | Admitting: Hematology and Oncology

## 2017-02-06 VITALS — BP 92/52 | HR 97 | Temp 98.7°F | Resp 18

## 2017-02-06 DIAGNOSIS — Z801 Family history of malignant neoplasm of trachea, bronchus and lung: Secondary | ICD-10-CM

## 2017-02-06 DIAGNOSIS — C8333 Diffuse large B-cell lymphoma, intra-abdominal lymph nodes: Secondary | ICD-10-CM

## 2017-02-06 DIAGNOSIS — Z803 Family history of malignant neoplasm of breast: Secondary | ICD-10-CM | POA: Diagnosis not present

## 2017-02-06 MED ORDER — MORPHINE SULFATE 15 MG PO TABS: 15.0000 mg | ORAL_TABLET | ORAL | 0 refills | Status: DC | PRN

## 2017-02-06 NOTE — Progress Notes (Signed)
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 Lymphoma was diagnosed in Dec 2015 and treated with dose-adjusted R-EPOCH with the patient receiving full 6 cycles of chemotherapy with 4 cycles of prophylactic intrathecal treatment. Treatment was conducted by [facility] system with complete response documented by imaging.  Now we have a pathologically-confirmed recurrence of the malignancy with appearance of the tumor cells as well as their immunohistochemical profile consistent with diffuse large B-cell lymphoma. Lymphoma does not appear to involve bone marrow at this time. Typically, salvage recurrence therapy involves chemotherapy followed by possible consolidation with high-dose chemotherapy and autologous stem cell transplant. Based on the patient's age, comorbidities, and current performance status, I do not believe her to be a candidate for treatment with R-ICE or R-GemOx combinations based on the above. Additional options available at this time include combination of rituximab plus bendamustine, ibrutinib, lenalidomide/dexamethasone with rituximab and rituximab alone.  All of the available options, almost every 1 includes potential risk of damage to the patient's kidney. Degree of immunosuppression imposed by the treatments combined with requirement for immunosuppressive therapy due to renal transplant also place patient at significantly high risk of infections. In the clinic today, I have reviewed the diagnosis with the patient. She insists on her willingness to proceed with treatment. I do not believe that any treatment administered at this time will be delivered with curative intent. Patient understands and still wants to palliative-intent chemotherapy given.  Over the listed regimen, my  preference is to proceed with rituximab and bendamustine based on reported activity of the therapy as well as schedule which would allow patient more time with her family and fewer clinic visits potentially. The component medications, schedule of administration of chemotherapy and most likely side effects have been discussed with the patient extensively.   Plan: --consult interventional radiology for Infuse-a-Port placement --Return to the clinic as soon as possible following the port placement to proceed with palliative-intent chemotherapy with rituximab and bendamustine.   Voice recognition software was used and creation of this note. Despite my best effort at editing the text, some misspelling/errors may have occurred.   Orders Placed This Encounter  Procedures  . IR Fluoro Guide CV Line Right    Indicate type of CVC ordering    Standing Status:   Future    Standing Expiration Date:   04/09/2018    Order Specific Question:   Reason for exam:    Answer:   Infusaport placement -- aggressive lymphoma, need to start treatment ASAP -- planned for 02/14/17    Order Specific Question:   Preferred Imaging Location?    Answer:   [facility]  . CBC with Differential    Standing Status:   Future    Standing Expiration Date:   02/06/2018  . Comprehensive metabolic panel    Standing Status:   Future    Standing Expiration Date:   02/06/2018  . Lactate dehydrogenase (LDH)    Standing Status:   Future    Standing Expiration Date:   02/06/2018  . Uric acid    Standing Status:   Future    Standing Expiration Date:   02/06/2018  . Magnesium    Standing Status:   Future    Standing Expiration Date:   02/06/2018  . Phosphorus    Standing Status:   Future  Standing Expiration Date:   02/06/2018    All questions were answered.  . The patient knows to call the clinic with any problems, questions or concerns.  This note was electronically signed.    History of Presenting Illness Erica Woodward 71 y.o. followed in the [place] for monitoring and treatment of recurrent lymphoma. In the interim, patient underwent and lymph nodes with lymph node biopsy confirming presence of recurrent lymphoma and bone marrow biopsy negative for evidence of involvement. Over the intervening period of time, patient continues to have significant amount of abdominal pain, musculoskeletal pain, nausea, and vomiting. Her pain was not well controlled by hydromorphone, and she reacted much better to parenteral morphine which was administered during an emergency room visit. Additionally, patient does derive some benefit from ondansetron, lasting approximately 2 hours after which she has recurrent nausea. This was addressed during her conversation with one of our nurses yesterday with my recommendation to treat with Compazine. Patient is yet to try this approach.  Patient denies any other new symptoms.   Oncological/hematological History:   NHL (non-Hodgkin's lymphoma) ([place])   06/25/2014 Initial Diagnosis    Diffuse large B-cell lymphoma (followed and treated by Hessie Dibble, MD & Lovenia Shuck Despina Hidden, PA-C at [facility]); As per the last note: "She [patient] presented to an outside hospital in December 2015 with worsening nausea, vomiting, and left lower quadrant abdominal pain. CT abdomen/ pelvis was concerning for peritoneal carcinomatosis. She was transferred to [facility] service for further management. A CT abdomen/ pelvis with contrast on 06/17/14 showed diffuse peritoneal metastatic disease, ascites, bilateral pleural effusions, and focal thickening of a loop of small bowel.   A paracentesis performed on 12/7 came back showing a high-grade malignant neoplasm favoring lymphoma. She underwent an open exploratory laparotomy and biopsy of the thickened omentum on 12/11. Pathology showed aggressive diffuse large B-cell lymphoma with Ki-67 of 100% that was strongly positive for  CD20, CD10, and BCL-6 and negative for BCL-2. FISH studies were negative for MYC, BCL6, and IgH/BCL2 translocations. EBV PCR and HIV were negative. Bone marrow biopsy showed a mild CD20+ interstitial B-cell lymphocytosis. Labs showed an elevated LDH of 326 and anemia (Hgb 10.5). She did not have a PET CT scan at diagnosis as chemotherapy was immediately started as an inpatient. CSF showed atypical lymphocytes favoring benign reactive pleocytosis but flow cytometry was not performed. She reported a 40 lb weight loss as well as progressive night sweats over 3 months. Thus, she had stage IVB disease at presentation with IPI 5.      07/02/2014 - 11/01/2014 Chemotherapy    Dose Adjusted R-EPOCH: --Cycle #1, 07/02/14: Dose Level 1 --Cycle #2, 07/26/14: Dose Level 2 --Cycle #3, 08/16/14: Dose Level 2 --Cycle #4, 09/06/14: Dose Level 2 --Cycle #5, 09/27/14: Dose Level 2 --Cycle #6, 11/01/14: Dose Level 1 -- Delayed by 1 week due to poor tolerance to treatment and decline in her performance status. Dose level reduced to 1.  Completed IT chemotherapy x 4 doses.          09/20/2014 Imaging    External PET-CT (after 4 cycles of systemic therapy): No evidence of FDG avid recurrent or metastatic disease. Interval resolution of diffuse mesenteric edema and abdominopelvic ascites. Interval resolution of small bilateral pleural effusions. Diffuse metabolic activity in the bone marrow consistent with chemotherapy treatment. Right adnexal cyst measuring 3.3 cm. Recommend annual ultrasound surveillance for this lesion.        12/29/2016 Relapse/Recurrence  Patient presented with recurrent abdominal pain, which led to CT of the abdomen with contrast and pain medications. Imaging revealed recurrent lymphadenopathy in the abdomen strongly suspicious for possible recurrent lymphoma. Presentation was complicated by development of delirium likely due to combination of severe pain, opioid pain medications, acute kidney  injury precipitated by hyperuricemia and possibly contrast administration in a dehydrated patient.  Patient required hospitalization, stabilized and improved significantly. Nevertheless, continues to have multiple problems not the least of which is persistent peripheral swelling, worsening performance status, dyspnea on exertion.      01/25/2017 Imaging    PET-CT: Intensely hypermetabolic retroperitoneal, pelvic, sigmoid mesocolon, and perirectal adenopathy compatible with malignancy. There is also a small but hypermetabolic paraesophageal lymph node in the thorax. Appearance compatible with active lymphoma.      01/25/2017 Imaging    MRI L-spine: Degenerative lumbar spondylosis with multilevel disc disease and facet disease. The most significant findings are at L4-5 where there is severe multifactorial spinal and bilateral lateral recess stenosis and significant right foraminal stenosis.      02/01/2017 Pathology Results    LN Core Bx: High-grade large cell lymphoma; IHC -- positive for LCA, CD79.9, CD20, CD10, BCL2, BCL6 & negative for CD30, CD34, CD138, CD3; BM Bx: No evidence of involvement by a monoclonal lymphoproliferative process       Medical History: Past Medical History:  Diagnosis Date  . Basal cell carcinoma   . Cancer [facility])    carcinomatosis  . Chronic headaches   . DDD (degenerative disc disease), lumbar    L4-5 right foraminal and spinal stenosis causng lumbar radiculopathy  . Diverticulosis   . GERD (gastroesophageal reflux disease)   . HLD (hyperlipidemia)   . Hx of adenomatous polyp of colon 05/21/2016  . Hypertension   . Renal disorder    kidney transplant 2005  . Status post dilation of esophageal narrowing     Surgical History: Past Surgical History:  Procedure Laterality Date  . COLONOSCOPY    . KIDNEY TRANSPLANT  07/26/2013  . LUMBAR PUNCTURE W/ INTRATHECAL CHEMOTHERAPY  08/19/14  . PORTACATH PLACEMENT    . UPPER GASTROINTESTINAL ENDOSCOPY       Family History: Family History  Problem Relation Age of Onset  . Kidney disease Father   . Diabetes Father   . Lung cancer Brother   . Seizures Brother   . Seizures Sister   . Breast cancer Daughter   . Heart disease Brother     Social History: Social History   Social History  . Marital status: Married    Spouse name: N/A  . Number of children: 4  . Years of education: N/A   Occupational History  . retired    Social History Main Topics  . Smoking status: Never Smoker  . Smokeless tobacco: Never Used  . Alcohol use No  . Drug use: No  . Sexual activity: Not on file   Other Topics Concern  . Not on file   Social History Narrative  . No narrative on file    Allergies: No Known Allergies  Medications:  Current Outpatient Prescriptions  Medication Sig Dispense Refill  . aspirin EC 81 MG tablet Take 81 mg by mouth daily.    Marland Kitchen atorvastatin (LIPITOR) 40 MG tablet TAKE 1 TABLET (40 MG TOTAL) BY MOUTH DAILY. 90 tablet 1  . cetirizine (ZYRTEC) 10 MG tablet Take 10 mg by mouth at bedtime.    Marland Kitchen esomeprazole (NEXIUM) 40 MG capsule Take 1 capsule (40 mg  total) by mouth daily. 90 capsule 3  . furosemide (LASIX) 80 MG tablet Take 40 mg by mouth daily.     Marland Kitchen gabapentin (NEURONTIN) 300 MG capsule Take 1 capsule (300 mg total) by mouth 3 (three) times daily. (Patient taking differently: Take 900 mg by mouth 2 (two) times daily. ) 270 capsule 3  . magnesium oxide (MAG-OX) 400 MG tablet Take 400 mg by mouth 2 (two) times daily.     Marland Kitchen morphine (MSIR) 15 MG tablet Take 1 tablet (15 mg total) by mouth every 2 (two) hours as needed for severe pain (cancer-associated pain). 50 tablet 0  . ondansetron (ZOFRAN ODT) 4 MG disintegrating tablet Take 1 tablet (4 mg total) by mouth every 8 (eight) hours as needed for nausea or vomiting. 15 tablet 0  . potassium chloride SA (K-DUR,KLOR-CON) 20 MEQ tablet Take 40 mEq by mouth daily.     . predniSONE (DELTASONE) 10 MG tablet Take 10 mg by  mouth daily with breakfast.    . predniSONE (DELTASONE) 20 MG tablet 3 tabs poqday 1-2, 2 tabs poqday 3-4, 1 tab poqday 5-6 (Patient not taking: Reported on 02/04/2017) 12 tablet 0  . prochlorperazine (COMPAZINE) 10 MG tablet Take 1 tablet (10 mg total) by mouth every 6 (six) hours as needed for nausea or vomiting. 30 tablet 0  . tacrolimus (PROGRAF) 0.5 MG capsule Take 1.5 mg by mouth 2 (two) times daily. Take 3 capsules (1.5 mg) BID     Current Facility-Administered Medications  Medication Dose Route Frequency Provider Last Rate Last Dose  . 0.9 %  sodium chloride infusion  500 mL Intravenous Continuous Gatha Mayer, MD        Review of Systems: Review of Systems  All other systems reviewed and are negative.    PHYSICAL EXAMINATION Blood pressure (!) 92/52, pulse 97, temperature 98.7 F (37.1 C), temperature source Oral, resp. rate 18, SpO2 97 %.  ECOG PERFORMANCE STATUS: 3 - Symptomatic, >50% confined to bed  Physical Exam  Constitutional: She is oriented to person, place, and time.  Patient appears fatigued.  HENT:  Head: Normocephalic and atraumatic.  Mouth/Throat: Oropharynx is clear and moist. No oropharyngeal exudate.  No oral thrush. No mucosal ulcers  Eyes: Pupils are equal, round, and reactive to light. Conjunctivae are normal. No scleral icterus.  Neck: No tracheal deviation present. No thyromegaly present.  Cardiovascular: Normal rate and regular rhythm.   No murmur heard. Distant heart sounds  Pulmonary/Chest: Effort normal. No stridor. She has no wheezes.  Upper lungs are clear to auscultation. Lower lungs with bilateral dependent crackles and possible bones to percussion at the bases of the lungs.  Abdominal: Soft. She exhibits no distension and no mass. There is tenderness. There is no rebound and no guarding.  If no palpable tenderness in the left upper quadrant of the abdomen without rebound or peritoneal irritation. No palpable mass.  Musculoskeletal:   Tenderness to palpation over the lower lumbar spine and sacrum. No radiation down in the extremities.  Lymphadenopathy:       Head (right side): No submandibular and no occipital adenopathy present.       Head (left side): No submandibular and no occipital adenopathy present.    She has no cervical adenopathy.    She has no axillary adenopathy.       Right: No inguinal and no supraclavicular adenopathy present.       Left: No inguinal and no supraclavicular adenopathy present.  Neurological: She is  oriented to person, place, and time. She has normal sensation, normal reflexes and intact cranial nerves.  Skin: She is not diaphoretic.     LABORATORY DATA: I have personally reviewed the data as listed: Admission on 02/04/2017, Discharged on 02/05/2017  Component Date Value Ref Range Status  . Lipase 02/04/2017 <10* 11 - 51 U/L Final  . Sodium 02/04/2017 137  135 - 145 mmol/L Final  . Potassium 02/04/2017 4.9  3.5 - 5.1 mmol/L Final  . Chloride 02/04/2017 105  101 - 111 mmol/L Final  . CO2 02/04/2017 23  22 - 32 mmol/L Final  . Glucose, Bld 02/04/2017 108* 65 - 99 mg/dL Final  . BUN 02/04/2017 22* 6 - 20 mg/dL Final  . Creatinine, Ser 02/04/2017 1.41* 0.44 - 1.00 mg/dL Final  . Calcium 02/04/2017 9.0  8.9 - 10.3 mg/dL Final  . Total Protein 02/04/2017 6.0* 6.5 - 8.1 g/dL Final  . Albumin 02/04/2017 3.4* 3.5 - 5.0 g/dL Final  . AST 02/04/2017 20  15 - 41 U/L Final  . ALT 02/04/2017 13* 14 - 54 U/L Final  . Alkaline Phosphatase 02/04/2017 72  38 - 126 U/L Final  . Total Bilirubin 02/04/2017 1.2  0.3 - 1.2 mg/dL Final  . GFR calc non Af Amer 02/04/2017 37* >60 mL/min Final  . GFR calc Af Amer 02/04/2017 43* >60 mL/min Final   Comment: (NOTE) The eGFR has been calculated using the CKD EPI equation. This calculation has not been validated in all clinical situations. eGFR's persistently <60 mL/min signify possible Chronic Kidney Disease.   . Anion gap 02/04/2017 9  5 - 15 Final  . WBC  02/04/2017 4.7  4.0 - 10.5 K/uL Final  . RBC 02/04/2017 3.89  3.87 - 5.11 MIL/uL Final  . Hemoglobin 02/04/2017 11.8* 12.0 - 15.0 g/dL Final  . HCT 02/04/2017 35.0* 36.0 - 46.0 % Final  . MCV 02/04/2017 90.0  78.0 - 100.0 fL Final  . MCH 02/04/2017 30.3  26.0 - 34.0 pg Final  . MCHC 02/04/2017 33.7  30.0 - 36.0 g/dL Final  . RDW 02/04/2017 14.3  11.5 - 15.5 % Final  . Platelets 02/04/2017 85* 150 - 400 K/uL Final   Comment: REPEATED TO VERIFY SPECIMEN CHECKED FOR CLOTS PLATELET COUNT CONFIRMED BY SMEAR   . Color, Urine 02/04/2017 YELLOW  YELLOW Final  . APPearance 02/04/2017 CLEAR  CLEAR Final  . Specific Gravity, Urine 02/04/2017 1.008  1.005 - 1.030 Final  . pH 02/04/2017 6.0  5.0 - 8.0 Final  . Glucose, UA 02/04/2017 NEGATIVE  NEGATIVE mg/dL Final  . Hgb urine dipstick 02/04/2017 NEGATIVE  NEGATIVE Final  . Bilirubin Urine 02/04/2017 NEGATIVE  NEGATIVE Final  . Ketones, ur 02/04/2017 5* NEGATIVE mg/dL Final  . Protein, ur 02/04/2017 NEGATIVE  NEGATIVE mg/dL Final  . Nitrite 02/04/2017 NEGATIVE  NEGATIVE Final  . Leukocytes, UA 02/04/2017 LARGE* NEGATIVE Final  . RBC / HPF 02/04/2017 6-30  0 - 5 RBC/hpf Final  . WBC, UA 02/04/2017 TOO NUMEROUS TO COUNT  0 - 5 WBC/hpf Final  . Bacteria, UA 02/04/2017 RARE* NONE SEEN Final  . Squamous Epithelial / LPF 02/04/2017 0-5* NONE SEEN Final  . Mucous 02/04/2017 PRESENT   Final  . Hyaline Casts, UA 02/04/2017 PRESENT   Final  Hospital Outpatient Visit on 02/01/2017  Component Date Value Ref Range Status  . WBC 02/01/2017 5.2  4.0 - 10.5 K/uL Final  . RBC 02/01/2017 4.18  3.87 - 5.11 MIL/uL Final  . Hemoglobin  02/01/2017 12.7  12.0 - 15.0 g/dL Final  . HCT 02/01/2017 37.7  36.0 - 46.0 % Final  . MCV 02/01/2017 90.2  78.0 - 100.0 fL Final  . MCH 02/01/2017 30.4  26.0 - 34.0 pg Final  . MCHC 02/01/2017 33.7  30.0 - 36.0 g/dL Final  . RDW 02/01/2017 14.1  11.5 - 15.5 % Final  . Platelets 02/01/2017 76* 150 - 400 K/uL Final   Comment:  REPEATED TO VERIFY SPECIMEN CHECKED FOR CLOTS PLATELET COUNT CONFIRMED BY SMEAR   . Neutrophils Relative % 02/01/2017 56  % Final  . Neutro Abs 02/01/2017 2.9  1.7 - 7.7 K/uL Final  . Lymphocytes Relative 02/01/2017 32  % Final  . Lymphs Abs 02/01/2017 1.6  0.7 - 4.0 K/uL Final  . Monocytes Relative 02/01/2017 11  % Final  . Monocytes Absolute 02/01/2017 0.6  0.1 - 1.0 K/uL Final  . Eosinophils Relative 02/01/2017 1  % Final  . Eosinophils Absolute 02/01/2017 0.1  0.0 - 0.7 K/uL Final  . Basophils Relative 02/01/2017 0  % Final  . Basophils Absolute 02/01/2017 0.0  0.0 - 0.1 K/uL Final  . aPTT 02/01/2017 28  24 - 36 seconds Final  . Prothrombin Time 02/01/2017 14.2  11.4 - 15.2 seconds Final  . INR 02/01/2017 1.10   Final       Ardath Sax, MD

## 2017-02-06 NOTE — Telephone Encounter (Signed)
appts made per los and avs printed for patient

## 2017-02-06 NOTE — Assessment & Plan Note (Signed)
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 Lymphoma was diagnosed in Dec 2015 and treated with dose-adjusted R-EPOCH with the patient receiving full 6 cycles of chemotherapy with 4 cycles of prophylactic intrathecal treatment. Treatment was conducted by [facility] system with complete response documented by imaging.  Now we have a pathologically-confirmed recurrence of the malignancy with appearance of the tumor cells as well as their immunohistochemical profile consistent with diffuse large B-cell lymphoma. Lymphoma does not appear to involve bone marrow at this time. Typically, salvage recurrence therapy involves chemotherapy followed by possible consolidation with high-dose chemotherapy and autologous stem cell transplant. Based on the patient's age, comorbidities, and current performance status, I do not believe her to be a candidate for treatment with R-ICE or R-GemOx combinations based on the above. Additional options available at this time include combination of rituximab plus bendamustine, ibrutinib, lenalidomide/dexamethasone with rituximab and rituximab alone.  All of the available options, almost every 1 includes potential risk of damage to the patient's kidney. Degree of immunosuppression imposed by the treatments combined with requirement for immunosuppressive therapy due to renal transplant also place patient at significantly high risk of infections. In the clinic today, I have reviewed the diagnosis with the patient. She insists on her willingness to proceed with treatment. I do not believe that any treatment administered at this time will be delivered with curative intent. Patient understands and still wants to palliative-intent chemotherapy given.  Over the listed regimen, my preference is to proceed with rituximab and bendamustine based on reported activity of the therapy as well  as schedule which would allow patient more time with her family and fewer clinic visits potentially. The component medications, schedule of administration of chemotherapy and most likely side effects have been discussed with the patient extensively.   Plan: --consult interventional radiology for Infuse-a-Port placement --Return to the clinic as soon as possible following the port placement to proceed with palliative-intent chemotherapy with rituximab and bendamustine.   Voice recognition software was used and creation of this note. Despite my best effort at editing the text, some misspelling/errors may have occurred.

## 2017-02-08 ENCOUNTER — Telehealth: Payer: Self-pay

## 2017-02-08 ENCOUNTER — Encounter (HOSPITAL_COMMUNITY): Payer: Self-pay | Admitting: *Deleted

## 2017-02-08 ENCOUNTER — Observation Stay (HOSPITAL_COMMUNITY)
Admission: EM | Admit: 2017-02-08 | Discharge: 2017-02-10 | Disposition: A | Discharge status: Home Health | Payer: Medicare HMO | Attending: Internal Medicine | Admitting: Internal Medicine

## 2017-02-08 DIAGNOSIS — Z940 Kidney transplant status: Secondary | ICD-10-CM

## 2017-02-08 DIAGNOSIS — E875 Hyperkalemia: Secondary | ICD-10-CM | POA: Insufficient documentation

## 2017-02-08 DIAGNOSIS — I10 Essential (primary) hypertension: Secondary | ICD-10-CM | POA: Diagnosis present

## 2017-02-08 DIAGNOSIS — Z7952 Long term (current) use of systemic steroids: Secondary | ICD-10-CM | POA: Insufficient documentation

## 2017-02-08 DIAGNOSIS — N183 Chronic kidney disease, stage 3 unspecified: Secondary | ICD-10-CM

## 2017-02-08 DIAGNOSIS — Z79891 Long term (current) use of opiate analgesic: Secondary | ICD-10-CM | POA: Diagnosis not present

## 2017-02-08 DIAGNOSIS — N179 Acute kidney failure, unspecified: Principal | ICD-10-CM | POA: Diagnosis present

## 2017-02-08 DIAGNOSIS — Z85828 Personal history of other malignant neoplasm of skin: Secondary | ICD-10-CM | POA: Insufficient documentation

## 2017-02-08 DIAGNOSIS — W19XXXA Unspecified fall, initial encounter: Secondary | ICD-10-CM | POA: Diagnosis not present

## 2017-02-08 DIAGNOSIS — R404 Transient alteration of awareness: Secondary | ICD-10-CM | POA: Diagnosis not present

## 2017-02-08 DIAGNOSIS — D696 Thrombocytopenia, unspecified: Secondary | ICD-10-CM | POA: Diagnosis not present

## 2017-02-08 DIAGNOSIS — K219 Gastro-esophageal reflux disease without esophagitis: Secondary | ICD-10-CM | POA: Diagnosis not present

## 2017-02-08 DIAGNOSIS — Z79899 Other long term (current) drug therapy: Secondary | ICD-10-CM | POA: Insufficient documentation

## 2017-02-08 DIAGNOSIS — Z8572 Personal history of non-Hodgkin lymphomas: Secondary | ICD-10-CM

## 2017-02-08 DIAGNOSIS — E860 Dehydration: Secondary | ICD-10-CM | POA: Diagnosis not present

## 2017-02-08 DIAGNOSIS — E785 Hyperlipidemia, unspecified: Secondary | ICD-10-CM | POA: Diagnosis not present

## 2017-02-08 DIAGNOSIS — I129 Hypertensive chronic kidney disease with stage 1 through stage 4 chronic kidney disease, or unspecified chronic kidney disease: Secondary | ICD-10-CM | POA: Diagnosis not present

## 2017-02-08 DIAGNOSIS — R42 Dizziness and giddiness: Secondary | ICD-10-CM | POA: Diagnosis not present

## 2017-02-08 DIAGNOSIS — Y92009 Unspecified place in unspecified non-institutional (private) residence as the place of occurrence of the external cause: Secondary | ICD-10-CM | POA: Diagnosis not present

## 2017-02-08 DIAGNOSIS — Z7982 Long term (current) use of aspirin: Secondary | ICD-10-CM | POA: Insufficient documentation

## 2017-02-08 DIAGNOSIS — G8929 Other chronic pain: Secondary | ICD-10-CM | POA: Diagnosis not present

## 2017-02-08 DIAGNOSIS — R402411 Glasgow coma scale score 13-15, in the field [EMT or ambulance]: Secondary | ICD-10-CM | POA: Diagnosis not present

## 2017-02-08 DIAGNOSIS — M5136 Other intervertebral disc degeneration, lumbar region: Secondary | ICD-10-CM | POA: Insufficient documentation

## 2017-02-08 LAB — CBC WITH DIFFERENTIAL/PLATELET
Basophils Absolute: 0 10*3/uL (ref 0.0–0.1)
Basophils Relative: 0 %
EOS ABS: 0 10*3/uL (ref 0.0–0.7)
Eosinophils Relative: 0 %
HEMATOCRIT: 33.6 % — AB (ref 36.0–46.0)
HEMOGLOBIN: 11 g/dL — AB (ref 12.0–15.0)
Lymphocytes Relative: 24 %
Lymphs Abs: 1.1 10*3/uL (ref 0.7–4.0)
MCH: 30.1 pg (ref 26.0–34.0)
MCHC: 32.7 g/dL (ref 30.0–36.0)
MCV: 91.8 fL (ref 78.0–100.0)
MONOS PCT: 21 %
Monocytes Absolute: 1 10*3/uL (ref 0.1–1.0)
NEUTROS ABS: 2.5 10*3/uL (ref 1.7–7.7)
NEUTROS PCT: 54 %
Platelets: 93 10*3/uL — ABNORMAL LOW (ref 150–400)
RBC: 3.66 MIL/uL — ABNORMAL LOW (ref 3.87–5.11)
RDW: 15.2 % (ref 11.5–15.5)
WBC: 4.7 10*3/uL (ref 4.0–10.5)

## 2017-02-08 LAB — COMPREHENSIVE METABOLIC PANEL
ALK PHOS: 71 U/L (ref 38–126)
ALT: 11 U/L — ABNORMAL LOW (ref 14–54)
ANION GAP: 10 (ref 5–15)
AST: 21 U/L (ref 15–41)
Albumin: 3.3 g/dL — ABNORMAL LOW (ref 3.5–5.0)
BILIRUBIN TOTAL: 1 mg/dL (ref 0.3–1.2)
BUN: 24 mg/dL — ABNORMAL HIGH (ref 6–20)
CALCIUM: 8.9 mg/dL (ref 8.9–10.3)
CO2: 23 mmol/L (ref 22–32)
CREATININE: 1.92 mg/dL — AB (ref 0.44–1.00)
Chloride: 107 mmol/L (ref 101–111)
GFR calc non Af Amer: 25 mL/min — ABNORMAL LOW (ref >60)
GFR, EST AFRICAN AMERICAN: 29 mL/min — AB (ref >60)
GLUCOSE: 105 mg/dL — AB (ref 65–99)
Potassium: 5.7 mmol/L — ABNORMAL HIGH (ref 3.5–5.1)
Sodium: 140 mmol/L (ref 135–145)
TOTAL PROTEIN: 5.7 g/dL — AB (ref 6.5–8.1)

## 2017-02-08 LAB — POC OCCULT BLOOD, ED: Fecal Occult Bld: NEGATIVE

## 2017-02-08 LAB — CK: Total CK: 171 U/L (ref 38–234)

## 2017-02-08 MED ORDER — SODIUM POLYSTYRENE SULFONATE 15 GM/60ML PO SUSP
15.0000 g | Freq: Once | ORAL | Status: AC
  Administered 2017-02-08: 15 g via ORAL
  Filled 2017-02-08: qty 60

## 2017-02-08 MED ORDER — ONDANSETRON 4 MG PO TBDP: 4.0000 mg | ORAL_TABLET | Freq: Three times a day (TID) | ORAL | Status: DC | PRN

## 2017-02-08 MED ORDER — MORPHINE SULFATE 15 MG PO TABS: 15.0000 mg | ORAL_TABLET | Freq: Four times a day (QID) | ORAL | Status: DC | PRN

## 2017-02-08 MED ORDER — SODIUM CHLORIDE 0.9 % IV SOLN: INTRAVENOUS | Status: AC

## 2017-02-08 MED ORDER — SODIUM CHLORIDE 0.9% FLUSH
3.0000 mL | Freq: Two times a day (BID) | INTRAVENOUS | Status: DC
  Administered 2017-02-08: 3 mL via INTRAVENOUS

## 2017-02-08 MED ORDER — PANTOPRAZOLE SODIUM 40 MG PO TBEC
40.0000 mg | DELAYED_RELEASE_TABLET | Freq: Every day | ORAL | Status: DC
  Administered 2017-02-08 – 2017-02-10 (×3): 40 mg via ORAL
  Filled 2017-02-08 (×3): qty 1

## 2017-02-08 MED ORDER — ATORVASTATIN CALCIUM 40 MG PO TABS
40.0000 mg | ORAL_TABLET | Freq: Every day | ORAL | Status: DC
  Administered 2017-02-08 – 2017-02-10 (×3): 40 mg via ORAL
  Filled 2017-02-08 (×3): qty 1

## 2017-02-08 MED ORDER — BISACODYL 5 MG PO TBEC
5.0000 mg | DELAYED_RELEASE_TABLET | Freq: Every day | ORAL | Status: DC | PRN
Start: 2017-02-08 — End: 2017-02-10

## 2017-02-08 MED ORDER — SODIUM CHLORIDE 0.9 % IV BOLUS (SEPSIS)
1000.0000 mL | Freq: Once | INTRAVENOUS | Status: AC
  Administered 2017-02-08: 1000 mL via INTRAVENOUS

## 2017-02-08 MED ORDER — PREDNISONE 10 MG PO TABS
10.0000 mg | ORAL_TABLET | Freq: Every day | ORAL | Status: DC
  Administered 2017-02-08 – 2017-02-10 (×3): 10 mg via ORAL
  Filled 2017-02-08 (×3): qty 1

## 2017-02-08 MED ORDER — TACROLIMUS 1 MG PO CAPS
1.5000 mg | ORAL_CAPSULE | Freq: Two times a day (BID) | ORAL | Status: DC
  Administered 2017-02-08 – 2017-02-10 (×4): 1.5 mg via ORAL
  Filled 2017-02-08 (×4): qty 1

## 2017-02-08 MED ORDER — ASPIRIN EC 81 MG PO TBEC
81.0000 mg | DELAYED_RELEASE_TABLET | Freq: Every day | ORAL | Status: DC
  Administered 2017-02-08 – 2017-02-10 (×3): 81 mg via ORAL
  Filled 2017-02-08 (×3): qty 1

## 2017-02-08 MED ORDER — GABAPENTIN 300 MG PO CAPS
300.0000 mg | ORAL_CAPSULE | Freq: Three times a day (TID) | ORAL | Status: DC
Start: 2017-02-08 — End: 2017-02-10
  Administered 2017-02-08 – 2017-02-10 (×6): 300 mg via ORAL
  Filled 2017-02-08 (×6): qty 1

## 2017-02-08 NOTE — ED Triage Notes (Addendum)
Per EMS, pt reports falling ~30 minutes after taking morphine this morning. Grandaughter found pt on floor and called EMS. Pt recently changed medications from hydrocodone to morphine, which she started today. Pt reports feeling sleepy at this time. Pt denies pain or loss of consciousness.

## 2017-02-08 NOTE — ED Notes (Signed)
ED TO INPATIENT HANDOFF REPORT  Name/Age/Gender Erica Woodward 71 y.o. female  Home/SNF/Other Home  Chief Complaint Fall  Code Status History    Date Active Date Inactive Code Status Order ID Comments User Context   12/31/2016  5:06 AM 01/03/2017  7:42 PM Partial Code [id number]  Rise Patience, MD ED   06/16/2014 12:36 AM 06/17/2014  6:57 PM Full Code [id number]  Ivor Costa, MD Inpatient    Questions for Most Recent Historical Code Status (Order [id number])    Question Answer Comment   In the event of cardiac or respiratory ARREST: Initiate Code Blue, Call Rapid Response Yes    In the event of cardiac or respiratory ARREST: Perform CPR Yes    In the event of cardiac or respiratory ARREST: Perform Intubation/Mechanical Ventilation No    In the event of cardiac or respiratory ARREST: Use NIPPV/BiPAp only if indicated Yes    In the event of cardiac or respiratory ARREST: Administer ACLS medications if indicated Yes    In the event of cardiac or respiratory ARREST: Perform Defibrillation or Cardioversion if indicated Yes       Level of Care/Admitting Diagnosis ED Disposition    ED Disposition Condition [place]: [place] [[id number]]  Level of Care: Telemetry [5]  Admit to tele based on following criteria: Monitor for Ischemic changes  Diagnosis: Acute renal failure (ARF) [facility]) [[id number]]  Admitting Physician: Caren Griffins [phone]  Attending Physician: Caren Griffins [phone]  Estimated length of stay: past midnight tomorrow  Certification:: I certify this patient will need inpatient services for at least 2 midnights  PT Class (Do Not Modify): Inpatient [101]  PT Acc Code (Do Not Modify): Private [1]       Medical History Past Medical History:  Diagnosis Date  . Basal cell carcinoma   . Cancer [facility])    carcinomatosis  . Chronic headaches   . DDD (degenerative disc disease), lumbar    L4-5 right foraminal and spinal stenosis  causng lumbar radiculopathy  . Diverticulosis   . GERD (gastroesophageal reflux disease)   . HLD (hyperlipidemia)   . Hx of adenomatous polyp of colon 05/21/2016  . Hypertension   . Renal disorder    kidney transplant 2005  . Status post dilation of esophageal narrowing     Allergies No Known Allergies  IV Location/Drains/Wounds Patient Lines/Drains/Airways Status   Active Line/Drains/Airways    Name:   Placement date:   Placement time:   Site:   Days:   Peripheral IV 02/08/17 Left Arm  02/08/17    1300    Arm    less than 1   Closed System Drain Left;Inferior Abdomen Bulb (JP)  07/08/14    2122    Abdomen    946   Incision - 2 Ports                      Labs/Imaging Results for orders placed or performed during the hospital encounter of 02/08/17 (from the past 48 hour(s))  POC occult blood, ED     Status: None   Collection Time: 02/08/17 12:20 PM  Result Value Ref Range   Fecal Occult Bld NEGATIVE NEGATIVE  CBC with Differential/Platelet     Status: Abnormal   Collection Time: 02/08/17 12:54 PM  Result Value Ref Range   WBC 4.7 4.0 - 10.5 K/uL   RBC 3.66 (L) 3.87 - 5.11 MIL/uL   Hemoglobin 11.0 (L)  12.0 - 15.0 g/dL   HCT 33.6 (L) 36.0 - 46.0 %   MCV 91.8 78.0 - 100.0 fL   MCH 30.1 26.0 - 34.0 pg   MCHC 32.7 30.0 - 36.0 g/dL   RDW 15.2 11.5 - 15.5 %   Platelets 93 (L) 150 - 400 K/uL    Comment: REPEATED TO VERIFY SPECIMEN CHECKED FOR CLOTS PLATELET COUNT CONFIRMED BY SMEAR    Neutrophils Relative % 54 %   Neutro Abs 2.5 1.7 - 7.7 K/uL   Lymphocytes Relative 24 %   Lymphs Abs 1.1 0.7 - 4.0 K/uL   Monocytes Relative 21 %   Monocytes Absolute 1.0 0.1 - 1.0 K/uL   Eosinophils Relative 0 %   Eosinophils Absolute 0.0 0.0 - 0.7 K/uL   Basophils Relative 0 %   Basophils Absolute 0.0 0.0 - 0.1 K/uL  Comprehensive metabolic panel     Status: Abnormal   Collection Time: 02/08/17 12:54 PM  Result Value Ref Range   Sodium 140 135 - 145 mmol/L   Potassium 5.7 (H) 3.5 -  5.1 mmol/L   Chloride 107 101 - 111 mmol/L   CO2 23 22 - 32 mmol/L   Glucose, Bld 105 (H) 65 - 99 mg/dL   BUN 24 (H) 6 - 20 mg/dL   Creatinine, Ser 1.92 (H) 0.44 - 1.00 mg/dL   Calcium 8.9 8.9 - 10.3 mg/dL   Total Protein 5.7 (L) 6.5 - 8.1 g/dL   Albumin 3.3 (L) 3.5 - 5.0 g/dL   AST 21 15 - 41 U/L   ALT 11 (L) 14 - 54 U/L   Alkaline Phosphatase 71 38 - 126 U/L   Total Bilirubin 1.0 0.3 - 1.2 mg/dL   GFR calc non Af Amer 25 (L) >60 mL/min   GFR calc Af Amer 29 (L) >60 mL/min    Comment: (NOTE) The eGFR has been calculated using the CKD EPI equation. This calculation has not been validated in all clinical situations. eGFR's persistently <60 mL/min signify possible Chronic Kidney Disease.    Anion gap 10 5 - 15  CK     Status: None   Collection Time: 02/08/17 12:54 PM  Result Value Ref Range   Total CK 171 38 - 234 U/L   No results found.  Pending Labs [facility]     Ordered   02/08/17 1212  Tacrolimus level  Once,   R     02/08/17 1211   Unscheduled  Occult blood card to lab, stool Provider will collect  As needed,   R    Question:  Specimen to be collected by?  Answer:  Provider will collect   02/08/17 1211   Signed and Held  Comprehensive metabolic panel  Tomorrow morning,   R     Signed and Held   Signed and Held  CBC  Tomorrow morning,   R     Signed and Held      Isolation Precautions No active isolations  Vitals/Pain Today's Vitals   02/08/17 1142 02/08/17 1145  BP: 131/63   Pulse: 97   Resp: 18   Temp: 98.4 F (36.9 C)   TempSrc: Oral   SpO2: 93%   PainSc:  0-No pain    Medications Medications  sodium chloride 0.9 % bolus 1,000 mL (1,000 mLs Intravenous Transfusing/Transfer 02/08/17 1529)  sodium polystyrene (KAYEXALATE) 15 GM/60ML suspension 15 g (15 g Oral Given 02/08/17 1518)    Mobility walks with device

## 2017-02-08 NOTE — ED Provider Notes (Signed)
[facility] DEPT Provider Note   CSN: [id number] Arrival date & time: 02/08/17  1127     History   Chief Complaint Chief Complaint  Patient presents with  . Fall    HPI Erica Woodward is a 71 y.o. female.  The history is provided by the patient.  Fall  This is a new problem. The current episode started 3 to 5 hours ago. Episode frequency: once. The problem has been resolved. Pertinent negatives include no chest pain, no abdominal pain, no headaches and no shortness of breath. Exacerbated by: position change. Nothing relieves the symptoms. She has tried nothing for the symptoms.  Dizziness  Quality:  Lightheadedness Severity:  Moderate Onset quality:  Sudden Timing:  Intermittent Progression:  Waxing and waning Chronicity:  Recurrent Context: standing up   Associated symptoms: blood in stool (melena) and diarrhea   Associated symptoms: no chest pain, no headaches, no nausea, no shortness of breath and no vomiting     Past Medical History:  Diagnosis Date  . Basal cell carcinoma   . Cancer [facility])    carcinomatosis  . Chronic headaches   . DDD (degenerative disc disease), lumbar    L4-5 right foraminal and spinal stenosis causng lumbar radiculopathy  . Diverticulosis   . GERD (gastroesophageal reflux disease)   . HLD (hyperlipidemia)   . Hx of adenomatous polyp of colon 05/21/2016  . Hypertension   . Renal disorder    kidney transplant 2005  . Status post dilation of esophageal narrowing     Patient Active Problem List   Diagnosis Date Noted  . Shortness of breath 01/08/2017  . Anasarca 01/08/2017  . History of B-cell lymphoma 01/08/2017  . ARF (acute renal failure) ([place]) 12/31/2016  . Hx of adenomatous polyp of colon 05/21/2016  . NHL (non-Hodgkin's lymphoma) ([place]) 08/02/2014  . GERD (gastroesophageal reflux disease) 06/16/2014  . Renal transplant recipient 07/21/2010  . HLD (hyperlipidemia) 01/17/2010  . GOUT 01/17/2010  . Essential hypertension  01/17/2010  . KNEE INJURY, RIGHT 01/17/2010    Past Surgical History:  Procedure Laterality Date  . COLONOSCOPY    . KIDNEY TRANSPLANT  07/26/2013  . LUMBAR PUNCTURE W/ INTRATHECAL CHEMOTHERAPY  08/19/14  . PORTACATH PLACEMENT    . UPPER GASTROINTESTINAL ENDOSCOPY      OB History    No data available       Home Medications    Prior to Admission medications   Medication Sig Start Date End Date Taking? Authorizing Provider  aspirin EC 81 MG tablet Take 81 mg by mouth daily.    [provider]  atorvastatin (LIPITOR) 40 MG tablet TAKE 1 TABLET (40 MG TOTAL) BY MOUTH DAILY. 09/25/16   Susy Frizzle, MD  cetirizine (ZYRTEC) 10 MG tablet Take 10 mg by mouth at bedtime.    [provider]  esomeprazole (NEXIUM) 40 MG capsule Take 1 capsule (40 mg total) by mouth daily. 04/18/16   Susy Frizzle, MD  furosemide (LASIX) 80 MG tablet Take 40 mg by mouth daily.     [provider]  gabapentin (NEURONTIN) 300 MG capsule Take 1 capsule (300 mg total) by mouth 3 (three) times daily. Patient taking differently: Take 900 mg by mouth 2 (two) times daily.  10/03/16   Susy Frizzle, MD  magnesium oxide (MAG-OX) 400 MG tablet Take 400 mg by mouth 2 (two) times daily.     [provider]  morphine (MSIR) 15 MG tablet Take 1 tablet (15 mg total) by  mouth every 2 (two) hours as needed for severe pain (cancer-associated pain). 02/06/17   Ardath Sax, MD  ondansetron (ZOFRAN ODT) 4 MG disintegrating tablet Take 1 tablet (4 mg total) by mouth every 8 (eight) hours as needed for nausea or vomiting. 02/05/17 02/08/17  Saanvika Vazques, Grayce Sessions, MD  potassium chloride SA (K-DUR,KLOR-CON) 20 MEQ tablet Take 40 mEq by mouth daily.     [provider]  predniSONE (DELTASONE) 10 MG tablet Take 10 mg by mouth daily with breakfast.    [provider]  predniSONE (DELTASONE) 20 MG tablet 3 tabs poqday 1-2, 2 tabs poqday 3-4, 1 tab poqday 5-6 Patient not  taking: Reported on 02/04/2017 01/14/17   Susy Frizzle, MD  prochlorperazine (COMPAZINE) 10 MG tablet Take 1 tablet (10 mg total) by mouth every 6 (six) hours as needed for nausea or vomiting. 01/28/17   Ardath Sax, MD  tacrolimus (PROGRAF) 0.5 MG capsule Take 1.5 mg by mouth 2 (two) times daily. Take 3 capsules (1.5 mg) BID 06/28/14   [provider]    Family History Family History  Problem Relation Age of Onset  . Kidney disease Father   . Diabetes Father   . Lung cancer Brother   . Seizures Brother   . Seizures Sister   . Breast cancer Daughter   . Heart disease Brother     Social History Social History  Substance Use Topics  . Smoking status: Never Smoker  . Smokeless tobacco: Never Used  . Alcohol use No     Allergies   Patient has no known allergies.   Review of Systems Review of Systems  Respiratory: Negative for shortness of breath.   Cardiovascular: Negative for chest pain.  Gastrointestinal: Positive for blood in stool (melena) and diarrhea. Negative for abdominal pain, nausea and vomiting.  Neurological: Positive for dizziness. Negative for headaches.  All other systems are reviewed and are negative for acute change except as noted in the HPI    Physical Exam Updated Vital Signs BP 131/63 (BP Location: Left Arm)   Pulse 97   Temp 98.4 F (36.9 C) (Oral)   Resp 18   SpO2 93%   Physical Exam  Constitutional: She is oriented to person, place, and time. She appears well-developed and well-nourished. No distress.  HENT:  Head: Normocephalic and atraumatic.  Nose: Nose normal.  Eyes: Pupils are equal, round, and reactive to light. Conjunctivae and EOM are normal. Right eye exhibits no discharge. Left eye exhibits no discharge. No scleral icterus.  Neck: Normal range of motion. Neck supple.  Cardiovascular: Normal rate and regular rhythm.  Exam reveals no gallop and no friction rub.   No murmur heard. Pulmonary/Chest: Effort normal and  breath sounds normal. No stridor. No respiratory distress. She has no rales.  Abdominal: Soft. She exhibits no distension. There is no tenderness.  Genitourinary: Rectal exam shows guaiac negative stool.  Genitourinary Comments: Light brown stool   Musculoskeletal: She exhibits no edema or tenderness.  Neurological: She is alert and oriented to person, place, and time.  Skin: Skin is warm and dry. No rash noted. She is not diaphoretic. No erythema.  Psychiatric: She has a normal mood and affect.  Vitals reviewed.    ED Treatments / Results  Labs (all labs ordered are listed, but only abnormal results are displayed) Labs Reviewed  CBC WITH DIFFERENTIAL/PLATELET - Abnormal; Notable for the following:       Result Value   RBC 3.66 (*)  Hemoglobin 11.0 (*)    HCT 33.6 (*)    Platelets 93 (*)    All other components within normal limits  COMPREHENSIVE METABOLIC PANEL - Abnormal; Notable for the following:    Potassium 5.7 (*)    Glucose, Bld 105 (*)    BUN 24 (*)    Creatinine, Ser 1.92 (*)    Total Protein 5.7 (*)    Albumin 3.3 (*)    ALT 11 (*)    GFR calc non Af Amer 25 (*)    GFR calc Af Amer 29 (*)    All other components within normal limits  CK  TACROLIMUS LEVEL  POC OCCULT BLOOD, ED    EKG  EKG Interpretation  Date/Time:  Friday February 08 2017 12:26:09 EDT Ventricular Rate:  90 PR Interval:    QRS Duration: 92 QT Interval:  366 QTC Calculation: 448 R Axis:   80 Text Interpretation:  Sinus rhythm Low voltage, precordial leads Baseline wander in lead(s) II aVF No significant change since Confirmed by Addison Lank [phone]) on 02/08/2017 4:06:12 PM       Radiology No results found.  Procedures Procedures (including critical care time)  Medications Ordered in ED Medications  sodium chloride 0.9 % bolus 1,000 mL (1,000 mLs Intravenous Transfusing/Transfer 02/08/17 1529)  sodium polystyrene (KAYEXALATE) 15 GM/60ML suspension 15 g (15 g Oral Given 02/08/17  1518)     Initial Impression / Assessment and Plan / ED Course  I have reviewed the triage vital signs and the nursing notes.  Pertinent labs & imaging results that were available during my care of the patient were reviewed by me and considered in my medical decision making (see chart for details).     Positive orthostatics. Likely due to overdiuresis in from Lasix and superimposed with diarrhea. Patient reported melena but still here is brown and Hemoccult negative. CBC does show mildly downtrending hemoglobin. Labs also revealed acute renal insufficiency likely due to dehydration however patient is on Prograf. We'll obtain a Prograf level. Also noted to have hyperkalemia. Given her diarrhea will hold off on the Kayexalate. Since patient is already on Lasix will not use Lasix. We'll provide patient with IV hydration and admit for further management.  Final Clinical Impressions(s) / ED Diagnoses   Final diagnoses:  AKI (acute kidney injury) ([place])  Hyperkalemia  Dehydration     Guhan Bruington, Grayce Sessions, MD 02/08/17 1609

## 2017-02-08 NOTE — H&P (Signed)
History and Physical    Eliani Leclere TIW:[id number] DOB: [birth year] DOA: 02/08/2017  I have briefly reviewed the patient's prior medical records in [place]  PCP: Susy Frizzle, MD  Patient coming from: Home  Chief Complaint: Full  HPI: Erica Woodward is a 71 y.o. 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  Her lymphoma did not penetrate her bone marrow.  She is in the process of getting started for chemotherapy and getting a port placed by interventional radiology.  She came to the emergency room about 3-4 days ago with abdominal pain, nausea & vomiting and diarrhea.  At that time she received conservative management, improved and was discharged home.  She continued to feel poorly at home, little to almost no p.o. intake, and complains of progressive generalized weakness over the last 2 days.  She got to a point this morning that she try to get off of her bed and her legs were not able to keep her weight and she fell on the ground.  She remembers the fall.  She denies any syncopal episodes or loss of consciousness.  She denies any chest pain or palpitations.  She has no shortness of breath.  She complains of chronic lower extremity swelling, but appreciates that now things have gotten better.  She has no fever or chills.  She has no cough or chest congestion.  She denies any dysuria.  ED Course: In the emergency room she is afebrile, heart rate is in the 80s-90s, her blood pressure is within normal limits.  She is satting well on room air.  Blood work is remarkable for a potassium of 5.7, and worsening renal function with  a creatinine of 1.9.  TRH is asked to admit to the hospital for acute renal failure.  Review of Systems: As per HPI otherwise 10 point review of systems negative.   Past Medical History:  Diagnosis Date  . Basal cell carcinoma   . Cancer [facility])    carcinomatosis  . Chronic headaches   . DDD (degenerative disc disease), lumbar    L4-5 right foraminal and spinal stenosis causng lumbar radiculopathy  . Diverticulosis   . GERD (gastroesophageal reflux disease)   . HLD (hyperlipidemia)   . Hx of adenomatous polyp of colon 05/21/2016  . Hypertension   . Renal disorder    kidney transplant 2005  . Status post dilation of esophageal narrowing     Past Surgical History:  Procedure Laterality Date  . COLONOSCOPY    . KIDNEY TRANSPLANT  07/26/2013  . LUMBAR PUNCTURE W/ INTRATHECAL CHEMOTHERAPY  08/19/14  . PORTACATH PLACEMENT    . UPPER GASTROINTESTINAL ENDOSCOPY       reports that she has never smoked. She has never used smokeless tobacco. She reports that she does not drink alcohol or use drugs.  No Known Allergies  Family History  Problem Relation Age of Onset  . Kidney disease Father   . Diabetes Father   . Lung cancer Brother   . Seizures Brother   . Seizures Sister   . Breast cancer Daughter   . Heart disease Brother     Prior to Admission medications   Medication  Sig Start Date End Date Taking? Authorizing Provider  aspirin EC 81 MG tablet Take 81 mg by mouth daily.   Yes [provider]  atorvastatin (LIPITOR) 40 MG tablet TAKE 1 TABLET (40 MG TOTAL) BY MOUTH DAILY. 09/25/16  Yes Susy Frizzle, MD  cetirizine (ZYRTEC) 10 MG tablet Take 10 mg by mouth at bedtime.   Yes [provider]  esomeprazole (NEXIUM) 40 MG capsule Take 1 capsule (40 mg total) by mouth daily. 04/18/16  Yes Susy Frizzle, MD  furosemide (LASIX) 80 MG tablet Take 40 mg by mouth daily.    Yes [provider]  gabapentin (NEURONTIN) 300 MG capsule Take 1 capsule (300 mg  total) by mouth 3 (three) times daily. Patient taking differently: Take 900 mg by mouth 2 (two) times daily.  10/03/16  Yes Susy Frizzle, MD  magnesium oxide (MAG-OX) 400 MG tablet Take 400 mg by mouth 2 (two) times daily.    Yes [provider]  morphine (MSIR) 15 MG tablet Take 1 tablet (15 mg total) by mouth every 2 (two) hours as needed for severe pain (cancer-associated pain). 02/06/17  Yes Ardath Sax, MD  ondansetron (ZOFRAN ODT) 4 MG disintegrating tablet Take 1 tablet (4 mg total) by mouth every 8 (eight) hours as needed for nausea or vomiting. 02/05/17 02/08/17 Yes Cardama, Grayce Sessions, MD  potassium chloride SA (K-DUR,KLOR-CON) 20 MEQ tablet Take 40 mEq by mouth daily.    Yes [provider]  predniSONE (DELTASONE) 10 MG tablet Take 10 mg by mouth daily with breakfast.   Yes [provider]  prochlorperazine (COMPAZINE) 10 MG tablet Take 1 tablet (10 mg total) by mouth every 6 (six) hours as needed for nausea or vomiting. 01/28/17  Yes Perlov, Marinell Blight, MD  tacrolimus (PROGRAF) 0.5 MG capsule Take 1.5 mg by mouth 2 (two) times daily. Take 3 capsules (1.5 mg) BID 06/28/14  Yes [provider]  predniSONE (DELTASONE) 20 MG tablet 3 tabs poqday 1-2, 2 tabs poqday 3-4, 1 tab poqday 5-6 Patient not taking: Reported on 02/04/2017 01/14/17   Susy Frizzle, MD    Physical Exam: Vitals:   02/08/17 1142  BP: 131/63  Pulse: 97  Resp: 18  Temp: 98.4 F (36.9 C)  TempSrc: Oral  SpO2: 93%      Constitutional: NAD, calm, comfortable Eyes: PERRL, lids and conjunctivae normal ENMT: Mucous membranes are dry. Posterior pharynx clear of any exudate or lesions. Neck: normal, supple, no masses, no thyromegaly Respiratory: clear to auscultation bilaterally, no wheezing, no crackles. Normal respiratory effort. No accessory muscle use.  Cardiovascular: Regular rate and rhythm, no murmurs / rubs / gallops.  Trace extremity edema. 2+ pedal pulses.    Abdomen: no tenderness, no masses palpated. Bowel sounds positive.  Musculoskeletal: no clubbing / cyanosis. Normal muscle tone.  Skin: no rashes, lesions, ulcers. No induration Neurologic: CN 2-12 grossly intact. Strength 5/5 in all 4.  Psychiatric: Normal judgment and insight. Alert and oriented x 3. Normal mood.   Labs on Admission: I have personally reviewed following labs and imaging studies  CBC:  Recent Labs Lab 02/04/17 2020 02/08/17 1254  WBC 4.7 4.7  NEUTROABS  --  2.5  HGB 11.8* 11.0*  HCT 35.0* 33.6*  MCV 90.0 91.8  PLT 85* 93*   Basic Metabolic Panel:  Recent Labs Lab 02/04/17 2020 02/08/17 1254  NA 137 140  K 4.9 5.7*  CL 105 107  CO2 23 23  GLUCOSE 108*  105*  BUN 22* 24*  CREATININE 1.41* 1.92*  CALCIUM 9.0 8.9   GFR: Estimated Creatinine Clearance: 32 mL/min (A) (by C-G formula based on SCr of 1.92 mg/dL (H)). Liver Function Tests:  Recent Labs Lab 02/04/17 2020 02/08/17 1254  AST 20 21  ALT 13* 11*  ALKPHOS 72 71  BILITOT 1.2 1.0  PROT 6.0* 5.7*  ALBUMIN 3.4* 3.3*    Recent Labs Lab 02/04/17 2020  LIPASE <10*   No results for input(s): AMMONIA in the last 168 hours. Coagulation Profile: No results for input(s): INR, PROTIME in the last 168 hours. Cardiac Enzymes: No results for input(s): CKTOTAL, CKMB, CKMBINDEX, TROPONINI in the last 168 hours. BNP (last 3 results) No results for input(s): PROBNP in the last 8760 hours. HbA1C: No results for input(s): HGBA1C in the last 72 hours. CBG: No results for input(s): GLUCAP in the last 168 hours. Lipid Profile: No results for input(s): CHOL, HDL, LDLCALC, TRIG, CHOLHDL, LDLDIRECT in the last 72 hours. Thyroid Function Tests: No results for input(s): TSH, T4TOTAL, FREET4, T3FREE, THYROIDAB in the last 72 hours. Anemia Panel: No results for input(s): VITAMINB12, FOLATE, FERRITIN, TIBC, IRON, RETICCTPCT in the last 72 hours. Urine analysis:    Component Value Date/Time   COLORURINE  YELLOW 02/04/2017 2329   APPEARANCEUR CLEAR 02/04/2017 2329   APPEARANCEUR Clear 06/28/2014 1754   LABSPEC 1.008 02/04/2017 2329   LABSPEC 1.032 06/28/2014 1754   PHURINE 6.0 02/04/2017 2329   GLUCOSEU NEGATIVE 02/04/2017 2329   GLUCOSEU Negative 06/28/2014 1754   HGBUR NEGATIVE 02/04/2017 2329   BILIRUBINUR NEGATIVE 02/04/2017 2329   BILIRUBINUR 1+ 06/28/2014 1754   KETONESUR 5 (A) 02/04/2017 2329   PROTEINUR NEGATIVE 02/04/2017 2329   UROBILINOGEN 2.0 (H) 07/08/2014 2226   NITRITE NEGATIVE 02/04/2017 2329   LEUKOCYTESUR LARGE (A) 02/04/2017 2329   LEUKOCYTESUR Negative 06/28/2014 1754     Radiological Exams on Admission: No results found.  EKG: Independently reviewed.  Sinus rhythm  Assessment/Plan Active Problems:   HLD (hyperlipidemia)   Essential hypertension   Renal transplant recipient   GERD (gastroesophageal reflux disease)   History of B-cell lymphoma   Acute renal failure (ARF) (HCC)   Fall at home, initial encounter   CKD (chronic kidney disease), stage III   Thrombocytopenia (HCC)   Acute kidney injury on chronic kidney disease stage III -Likely in the setting of poor p.o. intake as well as being on the Lasix at home.  She appears clinically dry, will provide IV fluids overnight and repeat renal function in the morning -Hold Lasix  History of renal transplant -Continue prednisone and tacrolimus, levels pending -We will consult nephrology  B-cell lymphoma -Managed as an outpatient, patient is in the process to start her chemotherapy with oncology  Hyperkalemia -Likely in the setting of renal failure as well as potassium supplementation at home -Kayexalate 1, repeat potassium in the morning, keep on telemetry overnight  -EKG without changes significant for hyperkalemia  Hypertension -Resume home medications  Thrombocytopenia -Chronic, stable  Fall -We will obtain CK levels -PT consult in the morning  Chronic back pain -Resume home morphine,  a lower dose given worsening creatinine.  I raised concerns with family about her being on morphine as well as having weakness and a fall today, however they state that she has been on morphine for a few days without any issues  GERD -Resume PPI   DVT prophylaxis: SCDs Code Status: Full code Family Communication: Discussed with daughter and granddaughter at bedside Disposition  Plan: Admit to telemetry Consults called: Nephrology    Admission status: Inpatient  At the point of initial evaluation, it is my clinical opinion that admission for OBSERVATION is reasonable and necessary because the patient's presenting complaints in the context of their chronic conditions represent sufficient risk of deterioration or significant morbidity to constitute reasonable grounds for close observation in the hospital setting, but that the patient may be medically stable for discharge from the hospital within 24 to 48 hours.   At the time of admission, it appears that the appropriate admission status for this patient is INPATIENT. This is judged to be reasonable and necessary in order to provide the required high service intensity to ensure the patient's safety given the presenting symptoms, physical exam findings, and initial radiographic and laboratory data in the context of their chronic comorbidities. Current circumstances are renal failure in a renal transplant patient, hyperkalemia, and it is felt to place patient at high risk for further clinical deterioration threatening life, limb, or organ. Moreover, it is my clinical judgment that the patient will require inpatient hospital care spanning beyond 2 midnights from the point of admission and that early discharge would result in unnecessary risk of decompensation and readmission or threat to life, limb or bodily function.   Marzetta Board, MD [facility] Pager [phone]  If 7PM-7AM, please contact night-coverage [email or web address] Password  [facility]  02/08/2017, 2:53 PM

## 2017-02-08 NOTE — Telephone Encounter (Signed)
Daughter returned call. Because pt is a difficult stick would rather postpone until 8/9 for treatment. Wanted to know if MD still wanted to see pt on Wednesday. Message sent to Dr. Lebron Conners to ask.

## 2017-02-08 NOTE — Telephone Encounter (Signed)
Attempted calls to home and mobile multiple times to ask pt and daughter if preference to postpone treatment following port placement on 8/6, or okay to begin 8/2 using peripheral IV.

## 2017-02-08 NOTE — ED Notes (Signed)
Bed: KN18 Expected date:  Expected time:  Means of arrival:  Comments: EMS-dizzy

## 2017-02-09 DIAGNOSIS — E860 Dehydration: Secondary | ICD-10-CM

## 2017-02-09 DIAGNOSIS — N183 Chronic kidney disease, stage 3 (moderate): Secondary | ICD-10-CM | POA: Diagnosis not present

## 2017-02-09 DIAGNOSIS — E785 Hyperlipidemia, unspecified: Secondary | ICD-10-CM | POA: Diagnosis not present

## 2017-02-09 DIAGNOSIS — Z8572 Personal history of non-Hodgkin lymphomas: Secondary | ICD-10-CM | POA: Diagnosis not present

## 2017-02-09 DIAGNOSIS — K219 Gastro-esophageal reflux disease without esophagitis: Secondary | ICD-10-CM | POA: Diagnosis not present

## 2017-02-09 DIAGNOSIS — Z940 Kidney transplant status: Secondary | ICD-10-CM | POA: Diagnosis not present

## 2017-02-09 DIAGNOSIS — W19XXXA Unspecified fall, initial encounter: Secondary | ICD-10-CM | POA: Diagnosis not present

## 2017-02-09 DIAGNOSIS — I10 Essential (primary) hypertension: Secondary | ICD-10-CM | POA: Diagnosis not present

## 2017-02-09 DIAGNOSIS — N179 Acute kidney failure, unspecified: Secondary | ICD-10-CM | POA: Diagnosis not present

## 2017-02-09 DIAGNOSIS — E869 Volume depletion, unspecified: Secondary | ICD-10-CM | POA: Diagnosis not present

## 2017-02-09 LAB — COMPREHENSIVE METABOLIC PANEL
ALBUMIN: 3 g/dL — AB (ref 3.5–5.0)
ALK PHOS: 64 U/L (ref 38–126)
ALT: 11 U/L — ABNORMAL LOW (ref 14–54)
AST: 23 U/L (ref 15–41)
Anion gap: 8 (ref 5–15)
BUN: 23 mg/dL — AB (ref 6–20)
CHLORIDE: 108 mmol/L (ref 101–111)
CO2: 22 mmol/L (ref 22–32)
CREATININE: 1.42 mg/dL — AB (ref 0.44–1.00)
Calcium: 8.6 mg/dL — ABNORMAL LOW (ref 8.9–10.3)
GFR, EST AFRICAN AMERICAN: 42 mL/min — AB (ref >60)
GFR, EST NON AFRICAN AMERICAN: 36 mL/min — AB (ref >60)
Glucose, Bld: 131 mg/dL — ABNORMAL HIGH (ref 65–99)
POTASSIUM: 4.9 mmol/L (ref 3.5–5.1)
SODIUM: 138 mmol/L (ref 135–145)
TOTAL PROTEIN: 5.2 g/dL — AB (ref 6.5–8.1)
Total Bilirubin: 0.7 mg/dL (ref 0.3–1.2)

## 2017-02-09 LAB — CBC
HEMATOCRIT: 32.1 % — AB (ref 36.0–46.0)
Hemoglobin: 10.6 g/dL — ABNORMAL LOW (ref 12.0–15.0)
MCH: 30.9 pg (ref 26.0–34.0)
MCHC: 33 g/dL (ref 30.0–36.0)
MCV: 93.6 fL (ref 78.0–100.0)
PLATELETS: 85 10*3/uL — AB (ref 150–400)
RBC: 3.43 MIL/uL — AB (ref 3.87–5.11)
RDW: 15.4 % (ref 11.5–15.5)
WBC: 5.4 10*3/uL (ref 4.0–10.5)

## 2017-02-09 MED ORDER — SODIUM CHLORIDE 0.9 % IV SOLN
INTRAVENOUS | Status: AC
  Administered 2017-02-09 – 2017-02-10 (×2): via INTRAVENOUS

## 2017-02-09 NOTE — Care Management Note (Signed)
Case Management Note  Patient Details  Name: Nashla Althoff MRN: [id number] Date of Birth: 1946/06/28  Subjective/Objective:    AKI, b-cell lymphoma, hyperkalemia                Action/Plan: Discharge Planning: NCM spoke to pt and dtr, Silas [place] at bedside. Offered choice for HH/list provided. Dtr states she had [facility] in the past. She has RW at home. Requesting 3n1 bedside commode. Will have bedside commode delivered to room in am prior to dc. Will contact [place] for new referral for [facility].   PCP Susy Frizzle MD  Expected Discharge Date:                  Expected Discharge Plan:  [place]  In-House Referral:  NA  Discharge planning Services  CM Consult  Post Acute Care Choice:  Home Health Choice offered to:  Adult Children, Patient  DME Arranged:  3-N-1 DME Agency:  [place]:  PT [place] Agency:  [place]  Status of Service:  Completed, signed off  If discussed at [place] of Stay Meetings, dates discussed:    Additional Comments:  Erenest Rasher, RN 02/09/2017, 5:53 PM

## 2017-02-09 NOTE — Care Management CC44 (Signed)
Condition Code 44 Documentation Completed  Patient Details  Name: Erica Woodward MRN: [id number] Date of Birth: [birth year]   Condition Code 44 given:  Yes Patient signature on Condition Code 44 notice:  Yes Documentation of 2 MD's agreement:  Yes Code 44 added to claim:  Yes    Erenest Rasher, RN 02/09/2017, 5:52 PM

## 2017-02-09 NOTE — Evaluation (Signed)
Physical Therapy Evaluation Patient Details Name: Erica Woodward MRN: [id number] DOB: 1946-06-25 Today's Date: 02/09/2017   History of Present Illness  This 71 year old female was admitted s/p fall at home.  She has a h/o NHL and kidney transplant and with current dx of Carcinomatosis.    Clinical Impression  Pt admitted as above and presenting with functional mobility limitations 2* generalized weakness and ambulatory balance deficits.  Pt should progress to dc home with assist of family and would benefit from follow up HHPT to further address deficits.    Follow Up Recommendations Home health PT    Equipment Recommendations  None recommended by PT    Recommendations for Other Services OT consult     Precautions / Restrictions Precautions Precautions: Fall Restrictions Weight Bearing Restrictions: No      Mobility  Bed Mobility Overal bed mobility: Modified Independent             General bed mobility comments: Pt unassisted supine to EOB sitting but utilizing bed reail  Transfers Overall transfer level: Needs assistance Equipment used: Rolling walker (2 wheeled) Transfers: Sit to/from Stand Sit to Stand: Min assist;Min guard         General transfer comment: cues for transition position and use of UEs to self assist.  Ambulation/Gait Ambulation/Gait assistance: Min assist;Min guard Ambulation Distance (Feet): 450 Feet Assistive device: Rolling walker (2 wheeled) Gait Pattern/deviations: Step-through pattern;Decreased step length - right;Decreased step length - left;Shuffle;Trunk flexed     General Gait Details: Cues for posture and position from RW.  Stairs            Wheelchair Mobility    Modified Rankin (Stroke Patients Only)       Balance                                             Pertinent Vitals/Pain Pain Assessment: No/denies pain    Home Living Family/patient expects to be discharged to:: Private  residence Living Arrangements: Other relatives Available Help at Discharge: Family Type of Home: House Home Access: Stairs to enter Entrance Stairs-Rails: Left;Can reach Advertising account executive of Steps: [place]: One level Home Equipment: Environmental consultant - 2 wheels;Bedside commode;Cane - single point Additional Comments: Pt plans to move in with grandson following release from hospital    Prior Function Level of Independence: Independent with assistive device(s)         Comments: used cane and RW as needed     Hand Dominance        Extremity/Trunk Assessment   Upper Extremity Assessment Upper Extremity Assessment: Generalized weakness    Lower Extremity Assessment Lower Extremity Assessment: Generalized weakness;RLE deficits/detail;LLE deficits/detail RLE Coordination: decreased fine motor LLE Coordination: decreased fine motor       Communication   Communication: No difficulties;HOH  Cognition Arousal/Alertness: Awake/alert Behavior During Therapy: WFL for tasks assessed/performed Overall Cognitive Status: Within Functional Limits for tasks assessed                                        General Comments      Exercises     Assessment/Plan    PT Assessment Patient needs continued PT services  PT Problem List Decreased strength;Decreased activity tolerance;Decreased balance;Decreased mobility;Decreased knowledge of use of DME;Obesity  PT Treatment Interventions DME instruction;Gait training;Stair training;Functional mobility training;Therapeutic activities;Therapeutic exercise;Balance training;Patient/family education    PT Goals (Current goals can be found in the Care Plan section)  Acute Rehab PT Goals Patient Stated Goal: Regain IND PT Goal Formulation: With patient Time For Goal Achievement: 02/23/17 Potential to Achieve Goals: Fair    Frequency Min 3X/week   Barriers to discharge        Co-evaluation                AM-PAC PT "6 Clicks" Daily Activity  Outcome Measure Difficulty turning over in bed (including adjusting bedclothes, sheets and blankets)?: A Little Difficulty moving from lying on back to sitting on the side of the bed? : A Little Difficulty sitting down on and standing up from a chair with arms (e.g., wheelchair, bedside commode, etc,.)?: A Lot Help needed moving to and from a bed to chair (including a wheelchair)?: A Lot Help needed walking in hospital room?: A Little Help needed climbing 3-5 steps with a railing? : A Lot 6 Click Score: 15    End of Session Equipment Utilized During Treatment: Gait belt Activity Tolerance: Patient tolerated treatment well Patient left: in chair;with call bell/phone within reach;with family/visitor present Nurse Communication: Mobility status PT Visit Diagnosis: Difficulty in walking, not elsewhere classified (R26.2)    Time: 1104-1130 PT Time Calculation (min) (ACUTE ONLY): 26 min   Charges:   PT Evaluation $PT Eval Low Complexity: 1 Procedure PT Treatments $Gait Training: 8-22 mins   PT G Codes:        Pg [phone]   Ja Ohman 02/09/2017, 12:37 PM

## 2017-02-09 NOTE — Consult Note (Addendum)
Renal Service Consult Note [facility]  Erica Woodward 02/09/2017 [place] Requesting Physician:  Dr Cruzita Lederer  Reason for Consult:  Renal transplant HPI: The patient is a 71 y.o. 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  This was treated at [facility] w/ [place] with good response and went into remission.  The patient was admitted w/ AMS, abd pain w/ N/V in early June 2018 here. CT abd done for abd pain showed lymphadenopathy. She was referred to oncology (Dr Lebron Conners) and had BM Bx recently around 7/16 with a diagnosis of recurrence of diffuse B-cell lymphoma.  The pt was counseled on 7/25 of high risks of chemoRx along w/ immunosuppression for the kidney transplant and that any Rx would not be curative, but palliative in nature. Decision was made for Rituxan/ bendamustine regimen and she was referred for port placement.  She then presented on 7/27 to ED with fall after taking morphine pill.  Found on the floor.  Brought in by EMS. She was admitted for acute on CKD/ renal Tx, prob vol depletion from lasix and poor po intake.    Last echo done showed normal LVEF.  No hx CHF in chart.  K was 5.7 but now down to 4.9.  Creat was 1.9 on admission (normal 0.8- 1.1), and today is down to 1.42 after IVF's.  BP's here have been wnl.  AFebrile.  Pt is w/o complaints , except last week had N/V and diarrhea.  Orthostatics were wnl.    Was on BP meds until renal Tx , but since then hasn't needed any.  History per daughter.  She says her Myfortic was stopped in 2016 w/ the dx of cancer.  She now takes prograf 1.5 bid and pred 10/d w/ no recent changes.  F/B Dr. Florene Glen at [facility].       ROS  denies CP  no joint pain   no HA  no blurry vision  no rash  no diarrhea  no nausea/ vomiting  no dysuria  no difficulty voiding  no change in urine color    Past Medical History  Past Medical History:  Diagnosis Date  . Basal cell  carcinoma   . Cancer [facility])    carcinomatosis  . Chronic headaches   . DDD (degenerative disc disease), lumbar    L4-5 right foraminal and spinal stenosis causng lumbar radiculopathy  . Diverticulosis   . GERD (gastroesophageal reflux disease)   . HLD (hyperlipidemia)   . Hx of adenomatous polyp of colon 05/21/2016  . Hypertension   . Renal disorder    kidney transplant 2005  . Status post dilation of esophageal narrowing    Past Surgical History  Past Surgical History:  Procedure Laterality Date  . COLONOSCOPY    . KIDNEY TRANSPLANT  07/26/2013  . LUMBAR PUNCTURE W/ INTRATHECAL CHEMOTHERAPY  08/19/14  . PORTACATH PLACEMENT    . UPPER GASTROINTESTINAL ENDOSCOPY     Family History  Family History  Problem Relation Age of Onset  . Kidney disease Father   . Diabetes Father   . Lung cancer Brother   . Seizures Brother   . Seizures Sister   . Breast cancer Daughter   . Heart disease Brother    Social History  reports that she has never smoked. She has never used smokeless tobacco. She reports that she does not drink alcohol or use drugs. Allergies No Known Allergies Home medications  Prior to Admission medications   Medication Sig Start Date End Date Taking? Authorizing Provider  aspirin EC 81 MG tablet Take 81 mg by mouth daily.   Yes [provider]  atorvastatin (LIPITOR) 40 MG tablet TAKE 1 TABLET (40 MG TOTAL) BY MOUTH DAILY. 09/25/16  Yes Susy Frizzle, MD  cetirizine (ZYRTEC) 10 MG tablet Take 10 mg by mouth at bedtime.   Yes [provider]  esomeprazole (NEXIUM) 40 MG capsule Take 1 capsule (40 mg total) by mouth daily. 04/18/16  Yes Susy Frizzle, MD  furosemide (LASIX) 80 MG tablet Take 40 mg by mouth daily.    Yes [provider]  gabapentin (NEURONTIN) 300 MG capsule Take 1 capsule (300 mg total) by mouth 3 (three) times daily. Patient taking differently: Take 900 mg by mouth 2 (two) times daily.  10/03/16  Yes Susy Frizzle, MD   magnesium oxide (MAG-OX) 400 MG tablet Take 400 mg by mouth 2 (two) times daily.    Yes [provider]  morphine (MSIR) 15 MG tablet Take 1 tablet (15 mg total) by mouth every 2 (two) hours as needed for severe pain (cancer-associated pain). 02/06/17  Yes Perlov, Marinell Blight, MD  potassium chloride SA (K-DUR,KLOR-CON) 20 MEQ tablet Take 40 mEq by mouth daily.    Yes [provider]  predniSONE (DELTASONE) 10 MG tablet Take 10 mg by mouth daily with breakfast.   Yes [provider]  prochlorperazine (COMPAZINE) 10 MG tablet Take 1 tablet (10 mg total) by mouth every 6 (six) hours as needed for nausea or vomiting. 01/28/17  Yes Perlov, Marinell Blight, MD  tacrolimus (PROGRAF) 0.5 MG capsule Take 1.5 mg by mouth 2 (two) times daily. Take 3 capsules (1.5 mg) BID 06/28/14  Yes [provider]  predniSONE (DELTASONE) 20 MG tablet 3 tabs poqday 1-2, 2 tabs poqday 3-4, 1 tab poqday 5-6 Patient not taking: Reported on 02/04/2017 01/14/17   Susy Frizzle, MD   Liver Function Tests  Recent Labs Lab 02/04/17 2020 02/08/17 1254 02/09/17 0658  AST 20 21 23   ALT 13* 11* 11*  ALKPHOS 72 71 64  BILITOT 1.2 1.0 0.7  PROT 6.0* 5.7* 5.2*  ALBUMIN 3.4* 3.3* 3.0*    Recent Labs Lab 02/04/17 2020  LIPASE <10*   CBC  Recent Labs Lab 02/04/17 2020 02/08/17 1254 02/09/17 0658  WBC 4.7 4.7 5.4  NEUTROABS  --  2.5  --   HGB 11.8* 11.0* 10.6*  HCT 35.0* 33.6* 32.1*  MCV 90.0 91.8 93.6  PLT 85* 93* 85*   Basic Metabolic Panel  Recent Labs Lab 02/04/17 2020 02/08/17 1254 02/09/17 0658  NA 137 140 138  K 4.9 5.7* 4.9  CL 105 107 108  CO2 23 23 22   GLUCOSE 108* 105* 131*  BUN 22* 24* 23*  CREATININE 1.41* 1.92* 1.42*  CALCIUM 9.0 8.9 8.6*   Iron/TIBC/Ferritin/ %Sat No results found for: IRON, TIBC, FERRITIN, IRONPCTSAT  Vitals:   02/08/17 1900 02/08/17 2122 02/09/17 0457 02/09/17 1158  BP:  132/67 (!) 156/74 (!) 151/70  Pulse:  86 82 88  Resp:  18 18    Temp:  (!) 97.5 F (36.4 C) (!) 97 F (36.1 C) 97.8 F (36.6 C)  TempSrc:  Oral Oral Oral  SpO2:  97% 97% 98%  Weight: 105.1 kg (231 lb 11.3 oz)     Height: 5\' 4"  (1.626 m)      Exam Gen elderly obese HOH, no distress,  lying flat No rash, cyanosis or gangrene Sclera anicteric, throat clear  No jvd or bruits Chest clear bilat RRR no MRG Abd soft ntnd no mass or ascites +bs, RLQ transplant nontender GU defer MS no joint effusions or deformity Ext no LE edema / no  wounds or ulcers Neuro is alert, Ox 3 , nf   UA 7/23 - clear , 0-5 epi, TNTC wbc, 6-30 rbc's    Impression: 1. AKI - due to vol depletion. Was just started on lasix in June after hosp admit related edema accumulation.  No hx of CHF. Would dc lasix altogether for now. She has had B symptoms as well w/ N/V/D since recurrence of lymphoma.  High risk for dehydration, avoid all ACEi /ARB and nsaids'. Baseline creat 1.0.  Suspect creat will improve with IVF's.  Doubt rejection. Cont prograf/ pred. Have d/w family.  2. Pyuria - urine Cx ordered 3. Renal transplant - as above 4. Recurrent lymphoma - per primary/ oncology, to start chemoRx (palliative) when stable 5. HTN - not an issue since renal Tx in 2005 6. HOH    Plan - cont IVF's.  F/u creat in am.    Kelly Splinter MD [place] pager [phone]   02/09/2017, 4:54 PM

## 2017-02-09 NOTE — Progress Notes (Signed)
PROGRESS NOTE  Erica Woodward OIZ:[id number] DOB: 1946-04-15 DOA: 02/08/2017 PCP: Susy Frizzle, MD   LOS: 1 day   Brief Narrative / Interim history: Erica Woodward is a 71 y.o. female with medical history significant of renal failure status post renal transplantation in 2005, posttransplant history complicated by diffuse large B-cell lymphoma status post completed chemotherapy at [facility] and in remission, recently diagnosed with recurrence of her B-cell lymphoma 1 or 2 months ago with CT scan and PET, status post retroperitoneal node biopsy as well as bone marrow biopsy.  Her lymphoma did not penetrate her bone marrow.  She is in the process of getting started for chemotherapy and getting a port placed by interventional radiology.  She came to the emergency room about 3-4 days ago with abdominal pain, nausea & vomiting and diarrhea.At that time she received conservative management, improved and was discharged home.  She continued to feel poorly at home, little to almost no p.o. intake, and complains of progressive generalized weakness over the last 2 days.  Assessment & Plan: Active Problems:   HLD (hyperlipidemia)   Essential hypertension   Renal transplant recipient   GERD (gastroesophageal reflux disease)   History of B-cell lymphoma   Acute renal failure (ARF) (HCC)   Fall at home, initial encounter   CKD (chronic kidney disease), stage III   Thrombocytopenia (HCC)   Acute kidney injury on chronic kidney disease stage III -Likely in the setting of poor p.o. intake as well as being on the Lasix at home.  -Cr improved overnight with fluids, continue at a lower rate -Hold Lasix -nephrology to see, appreciate input in home Lasix dose  History of renal transplant -Continue prednisone and tacrolimus, levels pending  B-cell lymphoma -Managed as an outpatient, patient is in the process to start her chemotherapy with oncology  Hyperkalemia -Likely in the setting  of renal failure as well as potassium supplementation at home -improved after kayexalate  Hypertension -Resume home medications  Thrombocytopenia -Chronic, stable  Fall -CK normal -PT consult recommending HHPT  Chronic back pain -Resume home morphine, a lower dose given worsening creatinine.  GERD -Resume PPI   DVT prophylaxis: SCD Code Status: Full code Family Communication: granddaughter bedside Disposition Plan: home in 1 day  Consultants:   Nephrology   Procedures:   None   Antimicrobials:  None    Subjective: - no chest pain, shortness of breath, no abdominal pain, nausea or vomiting.   Objective: Vitals:   02/08/17 1900 02/08/17 2122 02/09/17 0457 02/09/17 1158  BP:  132/67 (!) 156/74 (!) 151/70  Pulse:  86 82 88  Resp:  18 18   Temp:  (!) 97.5 F (36.4 C) (!) 97 F (36.1 C) 97.8 F (36.6 C)  TempSrc:  Oral Oral Oral  SpO2:  97% 97% 98%  Weight: 105.1 kg (231 lb 11.3 oz)     Height: '5\' 4"'  (1.626 m)       Intake/Output Summary (Last 24 hours) at 02/09/17 1250 Last data filed at 02/08/17 2002  Gross per 24 hour  Intake              503 ml  Output                0 ml  Net              503 ml   Filed Weights   02/08/17 1900  Weight: 105.1 kg (231 lb 11.3 oz)    Examination:  Vitals:  02/08/17 1900 02/08/17 2122 02/09/17 0457 02/09/17 1158  BP:  132/67 (!) 156/74 (!) 151/70  Pulse:  86 82 88  Resp:  18 18   Temp:  (!) 97.5 F (36.4 C) (!) 97 F (36.1 C) 97.8 F (36.6 C)  TempSrc:  Oral Oral Oral  SpO2:  97% 97% 98%  Weight: 105.1 kg (231 lb 11.3 oz)     Height: '5\' 4"'  (1.626 m)       Constitutional: NAD Eyes: PERRL, lids and conjunctivae normal ENMT: Mucous membranes are moist.  Respiratory: clear to auscultation bilaterally, no wheezing, no crackles. Normal respiratory effort. No accessory muscle use.  Cardiovascular: Regular rate and rhythm, no murmurs / rubs / gallops. No LE edema. 2+ pedal pulses. No carotid bruits.    Abdomen: no tenderness. Bowel sounds positive.  Neurologic: non focal    Data Reviewed: I have independently reviewed following labs and imaging studies   CBC:  Recent Labs Lab 02/04/17 2020 02/08/17 1254 02/09/17 0658  WBC 4.7 4.7 5.4  NEUTROABS  --  2.5  --   HGB 11.8* 11.0* 10.6*  HCT 35.0* 33.6* 32.1*  MCV 90.0 91.8 93.6  PLT 85* 93* 85*   Basic Metabolic Panel:  Recent Labs Lab 02/04/17 2020 02/08/17 1254 02/09/17 0658  NA 137 140 138  K 4.9 5.7* 4.9  CL 105 107 108  CO2 '23 23 22  ' GLUCOSE 108* 105* 131*  BUN 22* 24* 23*  CREATININE 1.41* 1.92* 1.42*  CALCIUM 9.0 8.9 8.6*   GFR: Estimated Creatinine Clearance: 43.6 mL/min (A) (by C-G formula based on SCr of 1.42 mg/dL (H)). Liver Function Tests:  Recent Labs Lab 02/04/17 2020 02/08/17 1254 02/09/17 0658  AST '20 21 23  ' ALT 13* 11* 11*  ALKPHOS 72 71 64  BILITOT 1.2 1.0 0.7  PROT 6.0* 5.7* 5.2*  ALBUMIN 3.4* 3.3* 3.0*    Recent Labs Lab 02/04/17 2020  LIPASE <10*   No results for input(s): AMMONIA in the last 168 hours. Coagulation Profile: No results for input(s): INR, PROTIME in the last 168 hours. Cardiac Enzymes:  Recent Labs Lab 02/08/17 1254  CKTOTAL 171   BNP (last 3 results) No results for input(s): PROBNP in the last 8760 hours. HbA1C: No results for input(s): HGBA1C in the last 72 hours. CBG: No results for input(s): GLUCAP in the last 168 hours. Lipid Profile: No results for input(s): CHOL, HDL, LDLCALC, TRIG, CHOLHDL, LDLDIRECT in the last 72 hours. Thyroid Function Tests: No results for input(s): TSH, T4TOTAL, FREET4, T3FREE, THYROIDAB in the last 72 hours. Anemia Panel: No results for input(s): VITAMINB12, FOLATE, FERRITIN, TIBC, IRON, RETICCTPCT in the last 72 hours. Urine analysis:    Component Value Date/Time   COLORURINE YELLOW 02/04/2017 2329   APPEARANCEUR CLEAR 02/04/2017 2329   APPEARANCEUR Clear 06/28/2014 1754   LABSPEC 1.008 02/04/2017 2329   LABSPEC  1.032 06/28/2014 1754   PHURINE 6.0 02/04/2017 2329   GLUCOSEU NEGATIVE 02/04/2017 2329   GLUCOSEU Negative 06/28/2014 1754   HGBUR NEGATIVE 02/04/2017 2329   BILIRUBINUR NEGATIVE 02/04/2017 2329   BILIRUBINUR 1+ 06/28/2014 1754   KETONESUR 5 (A) 02/04/2017 2329   PROTEINUR NEGATIVE 02/04/2017 2329   UROBILINOGEN 2.0 (H) 07/08/2014 2226   NITRITE NEGATIVE 02/04/2017 2329   LEUKOCYTESUR LARGE (A) 02/04/2017 2329   LEUKOCYTESUR Negative 06/28/2014 1754   Sepsis Labs: Invalid input(s): PROCALCITONIN, LACTICIDVEN  No results found for this or any previous visit (from the past 240 hour(s)).    Radiology Studies:  No results found.   Scheduled Meds: . aspirin EC  81 mg Oral Daily  . atorvastatin  40 mg Oral Daily  . gabapentin  300 mg Oral TID  . pantoprazole  40 mg Oral Daily  . predniSONE  10 mg Oral Q breakfast  . sodium chloride flush  3 mL Intravenous Q12H  . tacrolimus  1.5 mg Oral BID   Continuous Infusions:    Marzetta Board, MD, PhD [facility] Pager [phone]  If 7PM-7AM, please contact night-coverage [email or web address] Password [email or web address] 02/09/2017, 12:50 PM

## 2017-02-09 NOTE — Care Management Obs Status (Signed)
[place] NOTIFICATION   Patient Details  Name: Erica Woodward MRN: [id number] Date of Birth: 05/11/1946   Medicare Observation Status Notification Given:  Yes    Erenest Rasher, RN 02/09/2017, 5:52 PM

## 2017-02-10 DIAGNOSIS — I10 Essential (primary) hypertension: Secondary | ICD-10-CM | POA: Diagnosis not present

## 2017-02-10 DIAGNOSIS — Z8572 Personal history of non-Hodgkin lymphomas: Secondary | ICD-10-CM | POA: Diagnosis not present

## 2017-02-10 DIAGNOSIS — K219 Gastro-esophageal reflux disease without esophagitis: Secondary | ICD-10-CM | POA: Diagnosis not present

## 2017-02-10 DIAGNOSIS — Z940 Kidney transplant status: Secondary | ICD-10-CM | POA: Diagnosis not present

## 2017-02-10 DIAGNOSIS — E785 Hyperlipidemia, unspecified: Secondary | ICD-10-CM | POA: Diagnosis not present

## 2017-02-10 DIAGNOSIS — N183 Chronic kidney disease, stage 3 (moderate): Secondary | ICD-10-CM | POA: Diagnosis not present

## 2017-02-10 DIAGNOSIS — W19XXXA Unspecified fall, initial encounter: Secondary | ICD-10-CM | POA: Diagnosis not present

## 2017-02-10 DIAGNOSIS — R269 Unspecified abnormalities of gait and mobility: Secondary | ICD-10-CM | POA: Diagnosis not present

## 2017-02-10 DIAGNOSIS — E860 Dehydration: Secondary | ICD-10-CM | POA: Diagnosis not present

## 2017-02-10 DIAGNOSIS — N179 Acute kidney failure, unspecified: Secondary | ICD-10-CM | POA: Diagnosis not present

## 2017-02-10 LAB — TACROLIMUS LEVEL: Tacrolimus (FK506) - LabCorp: 8.5 ng/mL (ref 2.0–20.0)

## 2017-02-10 LAB — RENAL FUNCTION PANEL
Albumin: 3 g/dL — ABNORMAL LOW (ref 3.5–5.0)
Anion gap: 7 (ref 5–15)
BUN: 21 mg/dL — AB (ref 6–20)
CHLORIDE: 112 mmol/L — AB (ref 101–111)
CO2: 22 mmol/L (ref 22–32)
Calcium: 8.3 mg/dL — ABNORMAL LOW (ref 8.9–10.3)
Creatinine, Ser: 1.24 mg/dL — ABNORMAL HIGH (ref 0.44–1.00)
GFR calc Af Amer: 50 mL/min — ABNORMAL LOW (ref >60)
GFR calc non Af Amer: 43 mL/min — ABNORMAL LOW (ref >60)
GLUCOSE: 99 mg/dL (ref 65–99)
POTASSIUM: 4 mmol/L (ref 3.5–5.1)
Phosphorus: 2.6 mg/dL (ref 2.5–4.6)
Sodium: 141 mmol/L (ref 135–145)

## 2017-02-10 NOTE — Discharge Summary (Signed)
Physician Discharge Summary  Erica Woodward OIN:[id number] DOB: [birth year] DOA: 02/08/2017  PCP: Susy Frizzle, MD  Admit date: 02/08/2017 Discharge date: 02/10/2017  Admitted From: home Disposition:  home  Recommendations for Outpatient Follow-up:  1. Follow up with Oncology as scheduled 2. Follow up with Dr. Florene Glen in 2-3 weeks  Home Health: PT Equipment/Devices: none  Discharge Condition: stable CODE STATUS: Full code Diet recommendation: regular  HPI: Erica Woodward is a 71 y.o. 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  Her lymphoma did not penetrate her bone marrow.  She is in the process of getting started for chemotherapy and getting a port placed by interventional radiology.  She came to the emergency room about 3-4 days ago with abdominal pain, nausea & vomiting and diarrhea.  At that time she received conservative management, improved and was discharged home.  She continued to feel poorly at home, little to almost no p.o. intake, and complains of progressive generalized weakness over the last 2 days.  She got to a point this morning that she try to get off of her bed and her legs were not able to keep her weight and she fell on the ground.  She remembers the fall.  She denies any syncopal episodes or loss of consciousness.  She denies any chest pain or palpitations.  She has no shortness of breath.  She complains of chronic lower extremity swelling, but appreciates that now things have gotten better.  She has no fever or chills.  She has no cough or chest congestion.  She denies any dysuria. ED Course: In the  emergency room she is afebrile, heart rate is in the 80s-90s, her blood pressure is within normal limits.  She is satting well on room air.  Blood work is remarkable for a potassium of 5.7, and worsening renal function with a creatinine of 1.9.  TRH is asked to admit to the hospital for acute renal failure.  Hospital Course: Discharge Diagnoses:  Active Problems:   HLD (hyperlipidemia)   Essential hypertension   Renal transplant recipient   GERD (gastroesophageal reflux disease)   ARF (acute renal failure) (HCC)   History of B-cell lymphoma   Acute renal failure (ARF) (HCC)   Fall at home, initial encounter   CKD (chronic kidney disease), stage III   Thrombocytopenia (HCC)   Acute kidney injury on chronic kidney disease stage III - Likely in the setting of poor p.o. intake as well as being on the Lasix at home. She received IV fluids, and her creatinine improved from 1.9 on admission to 1.2 on discharge, which is at her baseline. Nephrology was consulted and followed patient hospitalized. They recommended discontinuation of Lasix on discharge. Patient was advised to follow-up with Dr. Florene Glen within 1-2 weeks to reevaluate fluid status and need for further Lasix History of renal transplant -Continue prednisone and tacrolimus B-cell lymphoma -Managed as an outpatient, patient is in the process to start her chemotherapy with oncology Hyperkalemia -Likely in the setting of renal failure as well as potassium supplementation at home. Resolved with kayexalate and fluids. Hypertension -Resume home medications Thrombocytopenia -Chronic, stable Fall -CK norma. HHPT on d/c Chronic back pain -resume  home medications GERD -Resume PPI   Discharge Instructions   Allergies as of 02/10/2017   No Known Allergies     Medication List    STOP taking these medications   furosemide 80 MG tablet Commonly known as:  LASIX   ondansetron 4 MG disintegrating tablet Commonly known as:  ZOFRAN ODT     potassium chloride SA 20 MEQ tablet Commonly known as:  K-DUR,KLOR-CON     TAKE these medications   aspirin EC 81 MG tablet Take 81 mg by mouth daily.   atorvastatin 40 MG tablet Commonly known as:  LIPITOR TAKE 1 TABLET (40 MG TOTAL) BY MOUTH DAILY.   cetirizine 10 MG tablet Commonly known as:  ZYRTEC Take 10 mg by mouth at bedtime.   esomeprazole 40 MG capsule Commonly known as:  NEXIUM Take 1 capsule (40 mg total) by mouth daily.   gabapentin 300 MG capsule Commonly known as:  NEURONTIN Take 1 capsule (300 mg total) by mouth 3 (three) times daily. What changed:  how much to take  when to take this   magnesium oxide 400 MG tablet Commonly known as:  MAG-OX Take 400 mg by mouth 2 (two) times daily.   morphine 15 MG tablet Commonly known as:  MSIR Take 1 tablet (15 mg total) by mouth every 2 (two) hours as needed for severe pain (cancer-associated pain).   predniSONE 10 MG tablet Commonly known as:  DELTASONE Take 10 mg by mouth daily with breakfast.   predniSONE 20 MG tablet Commonly known as:  DELTASONE 3 tabs poqday 1-2, 2 tabs poqday 3-4, 1 tab poqday 5-6   prochlorperazine 10 MG tablet Commonly known as:  COMPAZINE Take 1 tablet (10 mg total) by mouth every 6 (six) hours as needed for nausea or vomiting.   tacrolimus 0.5 MG capsule Commonly known as:  PROGRAF Take 1.5 mg by mouth 2 (two) times daily. Take 3 capsules (1.5 mg) BID            Durable Medical Equipment        Start     Ordered   02/10/17 1000  For home use only DME 3 n 1  Once     02/10/17 1000     Follow-up Information    [facility].   Why:  Home Health Physical Therapy Contact information: [address] [place] [address] [phone]        Susy Frizzle, MD. Schedule an appointment as soon as possible for a visit in 1 week(s).   Specialty:  Family Medicine Contact information: [address] [place]  [address] [phone]        Estanislado Emms, MD. Schedule an appointment as soon as possible for a visit in 1 week(s).   Specialty:  Nephrology Contact information: [place] [address] [phone]          No Known Allergies  Consultations:  Nephrology   Procedures/Studies:  Ct Abdomen Pelvis Wo Contrast  Result Date: 02/04/2017 CLINICAL DATA:  Abdominal pain.  Diarrhea.  Vomiting. EXAM: CT ABDOMEN AND PELVIS WITHOUT CONTRAST TECHNIQUE: Multidetector CT imaging of the abdomen and pelvis was performed following the standard protocol without IV contrast. COMPARISON:  PET-CT 01/25/2017 FINDINGS: Lower chest: Motion artifact. Scattered atelectasis. No confluent consolidation or pleural fluid.  Upper normal heart size with coronary artery calcifications. A lymph node adjacent to the distal esophagus measures 5 mm, previously 4 mm. Hepatobiliary: Unchanged cyst in the left lobe of the liver. Gallbladder physiologically distended, no calcified stone. No biliary dilatation. Pancreas: Parenchymal atrophy. No ductal dilatation or inflammation. Spleen: Normal in size without focal abnormality. Adrenals/Urinary Tract: No adrenal nodule. Native kidneys are markedly atrophic. Transplant kidney in the right lower quadrant without transplant hydronephrosis. Urinary bladder is physiologically distended. No bladder wall thickening. Stomach/Bowel: Small hiatal hernia. Stomach physiologically distended. Enteric contrast within normal caliber small bowel, just reaching the cecum. No bowel obstruction. Small volume of colonic stool. Sigmoid colonic diverticulosis without acute diverticulitis. No liquid stool. No colonic inflammation or wall thickening. Vascular/Lymphatic: Atherosclerosis of the abdominal aorta without aneurysm. Rather extensive retrocrural, retroperitoneal, bilateral common ileum cool an left external iliac adenopathy, as characterized on recent PET.  Small presacral node measures 11 mm, unchanged additional small perirectal nodes are also unchanged. Minimal stranding about enlarged left periaortic nodes likely related to recent biopsy. Some of the stranding tracks along the left iliopsoas muscle anteriorly with small volume of fluid. Reproductive: A 2.9 X 2.3 cm right adnexal cystic structure is unchanged, not hypermetabolic on prior PET. Uterus is atrophic, normal for age. Other: No free air or ascites. Musculoskeletal: Degenerative change in the lumbar spine. There are no acute or suspicious osseous abnormalities. No evidence of adverse sequela post recent iliac bone biopsy. IMPRESSION: 1. Mild left retroperitoneal stranding/fluid about the left psoas muscle and periaortic lymph nodes, likely sequela of recent retroperitoneal lymph node biopsy. 2. Rather extensive retroperitoneal, retrocrural and left iliac adenopathy, and mild perirectal/presacral adenopathy as characterized on recent PET. 3. No bowel obstruction, enteric contrast reaches the colon. Colonic diverticulosis without diverticulitis. 4. Atrophic native kidneys with right lower quadrant renal transplant, no transplant hydronephrosis. 5. Aortic atherosclerosis. Electronically Signed   By: Jeb Levering M.D.   On: 02/04/2017 23:54   Mr Lumbar Spine Wo Contrast  Result Date: 01/25/2017 CLINICAL DATA:  Right leg pain.  History of non-Hodgkin's lymphoma. EXAM: MRI LUMBAR SPINE WITHOUT CONTRAST TECHNIQUE: Multiplanar, multisequence MR imaging of the lumbar spine was performed. No intravenous contrast was administered. COMPARISON:  CT scan 12/30/2016 FINDINGS: Segmentation: 5 lumbar type vertebral bodies. The last full intervertebral disc space is labeled L5-S1. This correlates with lumbar spine radiographs 01/09/2017 Alignment: Degenerative lumbar spondylosis with mild non isthmic spondylolisthesis of L4 due to facet disease. No pars defects. The remaining lumbar vertebral bodies are normally  aligned. Vertebrae:  No bone lesions or fracture. Conus medullaris: Extends to the bottom of T12 level and appears normal. Paraspinal and other soft tissues: The markedly atrophied kidneys consistent with renal failure. Retroperitoneal lymphadenopathy as noted on the recent CT scan consistent with known diagnosis of lymphoma. A right pelvic transplant kidney is noted. Disc levels: T12-L1:  No significant findings. L1-2: Mild diffuse annular bulge with mild lateral recess encroachment bilaterally. No significant spinal or foraminal stenosis. L2-3: Diffuse bulging annulus, osteophytic ridging and facet disease contributing to moderate bilateral lateral recess stenosis and mild bilateral foraminal encroachment. L3-4: Diffuse bulging annulus and advanced facet disease with mild spinal stenosis and moderate bilateral lateral recess stenosis. No significant foraminal stenosis. L4-5: Advanced disc disease and facet disease. See bulging uncovered disc, osteophytic ridging and a broad-based right foraminal/ extraforaminal and far lateral disc protrusion. There is severe spinal stenosis and bilateral lateral recess stenosis and significant right foraminal stenosis. This likely accounts for the patient's symptoms. L5-S1:  Diffuse annular bulge and severe facet disease. No significant spinal stenosis. Mild left lateral recess stenosis. Mild extraforaminal encroachment on the right L5 nerve root. IMPRESSION: Degenerative lumbar spondylosis with multilevel disc disease and facet disease. The most significant findings are at L4-5 where there is severe multifactorial spinal and bilateral lateral recess stenosis and significant right foraminal stenosis. Electronically Signed   By: Marijo Sanes M.D.   On: 01/25/2017 15:35   Nm Pet Image Initial (pi) Skull Base To Thigh  Result Date: 01/25/2017 CLINICAL DATA:  Subsequent treatment strategy for B-cell lymphoma, with retroperitoneal adenopathy. EXAM: NUCLEAR MEDICINE PET SKULL BASE  TO THIGH TECHNIQUE: 11.2 mCi F-18 FDG was injected intravenously. Full-ring PET imaging was performed from the skull base to thigh after the radiotracer. CT data was obtained and used for attenuation correction and anatomic localization. FASTING BLOOD GLUCOSE:  Value: 104 mg/dl COMPARISON:  Multiple exams, including CT abdomen from 12/30/2016 FINDINGS: NECK No hypermetabolic lymph nodes in the neck. CHEST A 4 mm paraesophageal lymph node on image 71/4 has a maximum standard uptake value of 12.3, favoring malignant involvement. Small type 1 hiatal hernia. Calcified granuloma posteriorly in the left upper lobe on image 15/8. ABDOMEN/PELVIS Extensive hypermetabolic retrocrural, periaortic/retroperitoneal, bilateral common iliac, left external iliac, sigmoid mesocolon, and perirectal space hypermetabolic adenopathy. One of the larger nodes is in the left periaortic chain and measures 2.0 cm in short axis on image 133/4, with maximum standard uptake value 55.3. Even the smaller lymph nodes, such as the perirectal space nodule measuring 0.7 by 0.9 cm on image 176/4, are highly hypermetabolic, maximum SUV 19.6. Severely atrophic native kidneys. Right pelvic transplant kidney. Photopenic cyst anteriorly in segment 2 of the liver. Photopenic cystic right adnexal lesion, 2.9 by 2.3 cm. Aortoiliac atherosclerotic vascular disease. SKELETON No focal hypermetabolic activity to suggest skeletal metastasis. Anterolisthesis at L4-5. IMPRESSION: 1. Intensely hypermetabolic retroperitoneal, pelvic, sigmoid mesocolon, and perirectal adenopathy compatible with malignancy. There is also a small but hypermetabolic paraesophageal lymph node in the thorax. Appearance compatible with active lymphoma. 2. Other imaging findings of potential clinical significance: Aortic Atherosclerosis (ICD10-I70.0). Photopenic hepatic and right adnexal cysts. Severely atrophic native kidneys with right pelvic transplant kidney. Small type 1 hiatal hernia.  Electronically Signed   By: Van Clines M.D.   On: 01/25/2017 14:17   Ct Biopsy  Result Date: 02/01/2017 INDICATION: 71 year old female with a history of lymphoma. EXAM: CT-GUIDED BONE MARROW BIOPSY. CT-GUIDED BIOPSY OF PARA-AORTIC LYMPH NODE MEDICATIONS: None. ANESTHESIA/SEDATION: Moderate (conscious) sedation was employed during this procedure. A total of Versed 2.0 mg and Fentanyl 100 mcg was administered intravenously. Moderate Sedation Time: 25 minutes. The patient's level of consciousness and vital signs were monitored continuously by radiology nursing throughout the procedure under my direct supervision. FLUOROSCOPY TIME:  CT COMPLICATIONS: None PROCEDURE: The procedure risks, benefits, and alternatives were explained to the patient. Questions regarding the procedure were encouraged and answered. The patient understands and consents to the procedure. Scout CT of the pelvis was performed for surgical planning purposes. The posterior pelvis was prepped with Betadinein a sterile fashion, and a sterile drape was applied covering the operative field. A sterile gown and sterile gloves were used for the procedure. Local anesthesia was provided with 1% Lidocaine. We targeted the right posterior iliac bone for biopsy. The skin and subcutaneous tissues were infiltrated with 1% lidocaine without epinephrine. A small stab incision was made with an 11 blade scalpel, and an 11 gauge Murphy needle was advanced with CT guidance to the  posterior cortex. Manual forced was used to advance the needle through the posterior cortex and the stylet was removed. A bone marrow aspirate was retrieved and passed to a cytotechnologist in the room. The Murphy needle was then advanced without the stylet for a core biopsy. The core biopsy was retrieved and also passed to a cytotechnologist. Manual pressure was used for hemostasis and a sterile dressing was placed. We then repositioned to a pre marked site on the posterior left  lumbar region, which had been prepped and draped in the usual sterile fashion. 1% lidocaine was then again used for local anesthesia. Using CT guidance, 17 gauge guide needle was advanced a 2 left-sided periaortic lymph nodal mass. Multiple 18 gauge core biopsy were acquired. Needle was removed and sterile bandage was placed. No complications were encountered no significant blood loss was encountered. Patient tolerated the procedure well and remained hemodynamically stable throughout. IMPRESSION: Status post CT-guided bone marrow biopsy, as well as CT-guided biopsy of para-aortic nodal tissue. Tissue specimen sent to pathology for complete histopathologic analysis. Signed, Dulcy Fanny. Earleen Newport, DO [facility] Radiology Electronically Signed   By: Corrie Mckusick D.O.   On: 02/01/2017 12:40   Ct Biopsy  Result Date: 02/01/2017 INDICATION: 71 year old female with a history of lymphoma. EXAM: CT-GUIDED BONE MARROW BIOPSY. CT-GUIDED BIOPSY OF PARA-AORTIC LYMPH NODE MEDICATIONS: None. ANESTHESIA/SEDATION: Moderate (conscious) sedation was employed during this procedure. A total of Versed 2.0 mg and Fentanyl 100 mcg was administered intravenously. Moderate Sedation Time: 25 minutes. The patient's level of consciousness and vital signs were monitored continuously by radiology nursing throughout the procedure under my direct supervision. FLUOROSCOPY TIME:  CT COMPLICATIONS: None PROCEDURE: The procedure risks, benefits, and alternatives were explained to the patient. Questions regarding the procedure were encouraged and answered. The patient understands and consents to the procedure. Scout CT of the pelvis was performed for surgical planning purposes. The posterior pelvis was prepped with Betadinein a sterile fashion, and a sterile drape was applied covering the operative field. A sterile gown and sterile gloves were used for the procedure. Local anesthesia was provided with 1%  Lidocaine. We targeted the right posterior iliac bone for biopsy. The skin and subcutaneous tissues were infiltrated with 1% lidocaine without epinephrine. A small stab incision was made with an 11 blade scalpel, and an 11 gauge Murphy needle was advanced with CT guidance to the posterior cortex. Manual forced was used to advance the needle through the posterior cortex and the stylet was removed. A bone marrow aspirate was retrieved and passed to a cytotechnologist in the room. The Murphy needle was then advanced without the stylet for a core biopsy. The core biopsy was retrieved and also passed to a cytotechnologist. Manual pressure was used for hemostasis and a sterile dressing was placed. We then repositioned to a pre marked site on the posterior left lumbar region, which had been prepped and draped in the usual sterile fashion. 1% lidocaine was then again used for local anesthesia. Using CT guidance, 17 gauge guide needle was advanced a 2 left-sided periaortic lymph nodal mass. Multiple 18 gauge core biopsy were acquired. Needle was removed and sterile bandage was placed. No complications were encountered no significant blood loss was encountered. Patient tolerated the procedure well and remained hemodynamically stable throughout. IMPRESSION: Status post CT-guided bone marrow biopsy, as well as CT-guided biopsy of para-aortic nodal tissue. Tissue specimen sent to pathology for complete histopathologic analysis. Signed, Dulcy Fanny. Earleen Newport, DO [facility] Radiology Electronically Signed   By: Corrie Mckusick D.O.   On: 02/01/2017 12:40   Ct Bone Marrow Biopsy & Aspiration  Result Date: 02/01/2017 INDICATION: 71 year old female with a history of lymphoma. EXAM: CT-GUIDED BONE MARROW BIOPSY. CT-GUIDED BIOPSY OF PARA-AORTIC LYMPH NODE MEDICATIONS: None. ANESTHESIA/SEDATION: Moderate (conscious) sedation was employed during this procedure. A total of Versed 2.0 mg and  Fentanyl 100 mcg was administered intravenously. Moderate Sedation Time: 25 minutes. The patient's level of consciousness and vital signs were monitored continuously by radiology nursing throughout the procedure under my direct supervision. FLUOROSCOPY TIME:  CT COMPLICATIONS: None PROCEDURE: The procedure risks, benefits, and alternatives were explained to the patient. Questions regarding the procedure were encouraged and answered. The patient understands and consents to the procedure. Scout CT of the pelvis was performed for surgical planning purposes. The posterior pelvis was prepped with Betadinein a sterile fashion, and a sterile drape was applied covering the operative field. A sterile gown and sterile gloves were used for the procedure. Local anesthesia was provided with 1% Lidocaine. We targeted the right posterior iliac bone for biopsy. The skin and subcutaneous tissues were infiltrated with 1% lidocaine without epinephrine. A small stab incision was made with an 11 blade scalpel, and an 11 gauge Murphy needle was advanced with CT guidance to the posterior cortex. Manual forced was used to advance the needle through the posterior cortex and the stylet was removed. A bone marrow aspirate was retrieved and passed to a cytotechnologist in the room. The Murphy needle was then advanced without the stylet for a core biopsy. The core biopsy was retrieved and also passed to a cytotechnologist. Manual pressure was used for hemostasis and a sterile dressing was placed. We then repositioned to a pre marked site on the posterior left lumbar region, which had been prepped and draped in the usual sterile fashion. 1% lidocaine was then again used for local anesthesia. Using CT guidance, 17 gauge guide needle was advanced a 2 left-sided periaortic lymph nodal mass. Multiple 18 gauge core biopsy were acquired. Needle was removed and sterile bandage was placed. No complications were encountered no significant blood loss was  encountered. Patient tolerated the procedure well and remained hemodynamically stable throughout. IMPRESSION: Status post CT-guided bone marrow biopsy, as well as CT-guided biopsy of para-aortic nodal tissue. Tissue specimen sent to pathology for complete histopathologic analysis. Signed, Dulcy Fanny. Earleen Newport, DO [facility] Radiology Electronically Signed   By: Corrie Mckusick D.O.   On: 02/01/2017 12:40     Subjective: - no chest pain, shortness of breath, no abdominal pain, nausea or vomiting.   Discharge Exam: Vitals:   02/10/17 0026 02/10/17 0503  BP: (!) 155/65 (!) 153/66  Pulse:  76  Resp:  18  Temp:  98.3 F (36.8 C)   Vitals:   02/09/17 1158 02/09/17 2133 02/10/17 0026 02/10/17 0503  BP: (!) 151/70 (!) 166/74 (!) 155/65 (!) 153/66  Pulse: 88 83  76  Resp:  18  18  Temp: 97.8 F (36.6 C) 98.1 F (36.7 C)  98.3 F (36.8 C)  TempSrc: Oral Oral  Oral  SpO2: 98% 93%  97%  Weight:      Height:        General: Pt is alert, awake, not in acute distress Cardiovascular: RRR, S1/S2 +, no rubs, no gallops Respiratory: CTA bilaterally, no wheezing, no rhonchi Abdominal: Soft, NT, ND, bowel sounds + Extremities: no edema, no cyanosis    The  results of significant diagnostics from this hospitalization (including imaging, microbiology, ancillary and laboratory) are listed below for reference.     Microbiology: No results found for this or any previous visit (from the past 240 hour(s)).   Labs: BNP (last 3 results) No results for input(s): BNP in the last 8760 hours. Basic Metabolic Panel:  Recent Labs Lab 02/04/17 2020 02/08/17 1254 02/09/17 0658 02/10/17 0654  NA 137 140 138 141  K 4.9 5.7* 4.9 4.0  CL 105 107 108 112*  CO2 _0 GLUCOSE 108* 105* 131* 99  BUN 22* 24* 23* 21*  CREATININE 1.41* 1.92* 1.42* 1.24*  CALCIUM 9.0 8.9 8.6* 8.3*  PHOS  --   --   --  2.6   Liver Function Tests:  Recent Labs Lab  02/04/17 2020 02/08/17 1254 02/09/17 0658 02/10/17 0654  AST _1 --   ALT 13* 11* 11*  --   ALKPHOS 72 71 64  --   BILITOT 1.2 1.0 0.7  --   PROT 6.0* 5.7* 5.2*  --   ALBUMIN 3.4* 3.3* 3.0* 3.0*    Recent Labs Lab 02/04/17 2020  LIPASE <10*   No results for input(s): AMMONIA in the last 168 hours. CBC:  Recent Labs Lab 02/04/17 2020 02/08/17 1254 02/09/17 0658  WBC 4.7 4.7 5.4  NEUTROABS  --  2.5  --   HGB 11.8* 11.0* 10.6*  HCT 35.0* 33.6* 32.1*  MCV 90.0 91.8 93.6  PLT 85* 93* 85*   Cardiac Enzymes:  Recent Labs Lab 02/08/17 1254  CKTOTAL 171   BNP: Invalid input(s): POCBNP CBG: No results for input(s): GLUCAP in the last 168 hours. D-Dimer No results for input(s): DDIMER in the last 72 hours. Hgb A1c No results for input(s): HGBA1C in the last 72 hours. Lipid Profile No results for input(s): CHOL, HDL, LDLCALC, TRIG, CHOLHDL, LDLDIRECT in the last 72 hours. Thyroid function studies No results for input(s): TSH, T4TOTAL, T3FREE, THYROIDAB in the last 72 hours.  Invalid input(s): FREET3 Anemia work up No results for input(s): VITAMINB12, FOLATE, FERRITIN, TIBC, IRON, RETICCTPCT in the last 72 hours. Urinalysis    Component Value Date/Time   COLORURINE YELLOW 02/04/2017 2329   APPEARANCEUR CLEAR 02/04/2017 2329   APPEARANCEUR Clear 06/28/2014 1754   LABSPEC 1.008 02/04/2017 2329   LABSPEC 1.032 06/28/2014 1754   PHURINE 6.0 02/04/2017 2329   GLUCOSEU NEGATIVE 02/04/2017 2329   GLUCOSEU Negative 06/28/2014 1754   HGBUR NEGATIVE 02/04/2017 2329   BILIRUBINUR NEGATIVE 02/04/2017 2329   BILIRUBINUR 1+ 06/28/2014 1754   KETONESUR 5 (A) 02/04/2017 2329   PROTEINUR NEGATIVE 02/04/2017 2329   UROBILINOGEN 2.0 (H) 07/08/2014 2226   NITRITE NEGATIVE 02/04/2017 2329   LEUKOCYTESUR LARGE (A) 02/04/2017 2329   LEUKOCYTESUR Negative 06/28/2014 1754   Sepsis Labs Invalid input(s): PROCALCITONIN,  WBC,  LACTICIDVEN Microbiology No results found  for this or any previous visit (from the past 240 hour(s)).   Time coordinating discharge: 38 minutes  SIGNED:  Marzetta Board, MD  [facility] 02/10/2017, 1:18 PM Pager [phone]  If 7PM-7AM, please contact night-coverage [email or web address] Password [email or web address]

## 2017-02-10 NOTE — Discharge Instructions (Signed)
Follow with Susy Frizzle, MD in 5-7 days  Please get a complete blood count and chemistry panel checked by your Primary MD at your next visit, and again as instructed by your Primary MD. Please get your medications reviewed and adjusted by your Primary MD.  Please request your Primary MD to go over all Hospital Tests and Procedure/Radiological results at the follow up, please get all Hospital records sent to your Prim MD by signing hospital release before you go home.  If you had Pneumonia of Lung problems at the Hospital: Please get a 2 view Chest X ray done in 6-8 weeks after hospital discharge or sooner if instructed by your Primary MD.  If you have Congestive Heart Failure: Please call your Cardiologist or Primary MD anytime you have any of the following symptoms:  1) 3 pound weight gain in 24 hours or 5 pounds in 1 week  2) shortness of breath, with or without a dry hacking cough  3) swelling in the hands, feet or stomach  4) if you have to sleep on extra pillows at night in order to breathe  Follow cardiac low salt diet and 1.5 lit/day fluid restriction.  If you have diabetes Accuchecks 4 times/day, Once in AM empty stomach and then before each meal. Log in all results and show them to your primary doctor at your next visit. If any glucose reading is under 80 or above 300 call your primary MD immediately.  If you have Seizure/Convulsions/Epilepsy: Please do not drive, operate heavy machinery, participate in activities at heights or participate in high speed sports until you have seen by Primary MD or a Neurologist and advised to do so again.  If you had Gastrointestinal Bleeding: Please ask your Primary MD to check a complete blood count within one week of discharge or at your next visit. Your endoscopic/colonoscopic biopsies that are pending at the time of discharge, will also need to followed by your Primary MD.  Get Medicines reviewed and adjusted. Please take all your  medications with you for your next visit with your Primary MD  Please request your Primary MD to go over all hospital tests and procedure/radiological results at the follow up, please ask your Primary MD to get all Hospital records sent to his/her office.  If you experience worsening of your admission symptoms, develop shortness of breath, life threatening emergency, suicidal or homicidal thoughts you must seek medical attention immediately by calling 911 or calling your MD immediately  if symptoms less severe.  You must read complete instructions/literature along with all the possible adverse reactions/side effects for all the Medicines you take and that have been prescribed to you. Take any new Medicines after you have completely understood and accpet all the possible adverse reactions/side effects.   Do not drive or operate heavy machinery when taking Pain medications.   Do not take more than prescribed Pain, Sleep and Anxiety Medications  Special Instructions: If you have smoked or chewed Tobacco  in the last 2 yrs please stop smoking, stop any regular Alcohol  and or any Recreational drug use.  Wear Seat belts while driving.  Please note You were cared for by a hospitalist during your hospital stay. If you have any questions about your discharge medications or the care you received while you were in the hospital after you are discharged, you can call the unit and asked to speak with the hospitalist on call if the hospitalist that took care of you is not available.  Once you are discharged, your primary care physician will handle any further medical issues. Please note that NO REFILLS for any discharge medications will be authorized once you are discharged, as it is imperative that you return to your primary care physician (or establish a relationship with a primary care physician if you do not have one) for your aftercare needs so that they can reassess your need for medications and monitor your  lab values.  You can reach the hospitalist office at phone [phone] or fax [phone]   If you do not have a primary care physician, you can call [phone] for a physician referral.  Activity: As tolerated with Full fall precautions use walker/cane & assistance as needed  Disposition Home

## 2017-02-11 NOTE — Progress Notes (Signed)
   02/09/17 1200  PT G-Codes **NOT FOR INPATIENT CLASS**  Functional Assessment Tool Used AM-PAC 6 Clicks Basic Mobility;Clinical judgement  Functional Limitation Mobility: Walking and moving around  Mobility: Walking and Moving Around Current Status (X7353) CI  Mobility: Walking and Moving Around Goal Status (G9924) [place]   G Codes input based off documentation from performed assessment by Debe Coder, PT.  Clide Dales, PT Pager: [phone] 02/11/2017

## 2017-02-12 DIAGNOSIS — I129 Hypertensive chronic kidney disease with stage 1 through stage 4 chronic kidney disease, or unspecified chronic kidney disease: Secondary | ICD-10-CM | POA: Diagnosis not present

## 2017-02-12 DIAGNOSIS — D696 Thrombocytopenia, unspecified: Secondary | ICD-10-CM | POA: Diagnosis not present

## 2017-02-12 DIAGNOSIS — M5136 Other intervertebral disc degeneration, lumbar region: Secondary | ICD-10-CM | POA: Diagnosis not present

## 2017-02-12 DIAGNOSIS — W19XXXD Unspecified fall, subsequent encounter: Secondary | ICD-10-CM | POA: Diagnosis not present

## 2017-02-12 DIAGNOSIS — G8929 Other chronic pain: Secondary | ICD-10-CM | POA: Diagnosis not present

## 2017-02-12 DIAGNOSIS — N183 Chronic kidney disease, stage 3 (moderate): Secondary | ICD-10-CM | POA: Diagnosis not present

## 2017-02-12 DIAGNOSIS — M48061 Spinal stenosis, lumbar region without neurogenic claudication: Secondary | ICD-10-CM | POA: Diagnosis not present

## 2017-02-12 DIAGNOSIS — Z940 Kidney transplant status: Secondary | ICD-10-CM | POA: Diagnosis not present

## 2017-02-12 DIAGNOSIS — C8330 Diffuse large B-cell lymphoma, unspecified site: Secondary | ICD-10-CM | POA: Diagnosis not present

## 2017-02-13 ENCOUNTER — Other Ambulatory Visit: Payer: Self-pay

## 2017-02-13 ENCOUNTER — Telehealth: Payer: Self-pay | Admitting: Hematology and Oncology

## 2017-02-13 ENCOUNTER — Ambulatory Visit: Payer: Self-pay | Admitting: Hematology and Oncology

## 2017-02-13 DIAGNOSIS — C8593 Non-Hodgkin lymphoma, unspecified, intra-abdominal lymph nodes: Secondary | ICD-10-CM

## 2017-02-13 MED ORDER — PROCHLORPERAZINE MALEATE 10 MG PO TABS: 10.0000 mg | ORAL_TABLET | Freq: Four times a day (QID) | ORAL | 0 refills | Status: DC | PRN

## 2017-02-13 MED ORDER — MORPHINE SULFATE ER 15 MG PO TBCR: 15.0000 mg | EXTENDED_RELEASE_TABLET | Freq: Two times a day (BID) | ORAL | 0 refills | Status: DC

## 2017-02-13 MED ORDER — MORPHINE SULFATE 15 MG PO TABS: 15.0000 mg | ORAL_TABLET | ORAL | 0 refills | Status: DC | PRN

## 2017-02-13 MED ORDER — LORAZEPAM 0.5 MG PO TABS: 0.5000 mg | ORAL_TABLET | Freq: Four times a day (QID) | ORAL | 0 refills | Status: DC | PRN

## 2017-02-13 NOTE — Telephone Encounter (Signed)
lvm to inform pt of 8/9 and 8/10 appts per sch msg

## 2017-02-14 ENCOUNTER — Other Ambulatory Visit: Payer: Self-pay | Admitting: Radiology

## 2017-02-14 ENCOUNTER — Other Ambulatory Visit: Payer: Self-pay

## 2017-02-14 DIAGNOSIS — I129 Hypertensive chronic kidney disease with stage 1 through stage 4 chronic kidney disease, or unspecified chronic kidney disease: Secondary | ICD-10-CM | POA: Diagnosis not present

## 2017-02-14 DIAGNOSIS — W19XXXD Unspecified fall, subsequent encounter: Secondary | ICD-10-CM | POA: Diagnosis not present

## 2017-02-14 DIAGNOSIS — Z940 Kidney transplant status: Secondary | ICD-10-CM | POA: Diagnosis not present

## 2017-02-14 DIAGNOSIS — M5136 Other intervertebral disc degeneration, lumbar region: Secondary | ICD-10-CM | POA: Diagnosis not present

## 2017-02-14 DIAGNOSIS — N183 Chronic kidney disease, stage 3 (moderate): Secondary | ICD-10-CM | POA: Diagnosis not present

## 2017-02-14 DIAGNOSIS — G8929 Other chronic pain: Secondary | ICD-10-CM | POA: Diagnosis not present

## 2017-02-14 DIAGNOSIS — C8330 Diffuse large B-cell lymphoma, unspecified site: Secondary | ICD-10-CM | POA: Diagnosis not present

## 2017-02-14 DIAGNOSIS — D696 Thrombocytopenia, unspecified: Secondary | ICD-10-CM | POA: Diagnosis not present

## 2017-02-14 DIAGNOSIS — M48061 Spinal stenosis, lumbar region without neurogenic claudication: Secondary | ICD-10-CM | POA: Diagnosis not present

## 2017-02-15 ENCOUNTER — Other Ambulatory Visit: Payer: Self-pay | Admitting: Radiology

## 2017-02-15 ENCOUNTER — Encounter (HOSPITAL_COMMUNITY): Payer: Self-pay

## 2017-02-18 ENCOUNTER — Other Ambulatory Visit: Payer: Self-pay | Admitting: Hematology and Oncology

## 2017-02-18 ENCOUNTER — Encounter (HOSPITAL_COMMUNITY): Payer: Self-pay

## 2017-02-18 ENCOUNTER — Ambulatory Visit (HOSPITAL_COMMUNITY)
Admission: RE | Admit: 2017-02-18 | Discharge: 2017-02-18 | Disposition: A | Discharge status: Home / Self Care | Payer: Medicare HMO | Source: Ambulatory Visit | Attending: Hematology and Oncology | Admitting: Hematology and Oncology

## 2017-02-18 DIAGNOSIS — K219 Gastro-esophageal reflux disease without esophagitis: Secondary | ICD-10-CM | POA: Insufficient documentation

## 2017-02-18 DIAGNOSIS — C8590 Non-Hodgkin lymphoma, unspecified, unspecified site: Secondary | ICD-10-CM | POA: Diagnosis not present

## 2017-02-18 DIAGNOSIS — I10 Essential (primary) hypertension: Secondary | ICD-10-CM | POA: Diagnosis not present

## 2017-02-18 DIAGNOSIS — Z940 Kidney transplant status: Secondary | ICD-10-CM | POA: Insufficient documentation

## 2017-02-18 DIAGNOSIS — C8333 Diffuse large B-cell lymphoma, intra-abdominal lymph nodes: Secondary | ICD-10-CM

## 2017-02-18 DIAGNOSIS — M5136 Other intervertebral disc degeneration, lumbar region: Secondary | ICD-10-CM | POA: Diagnosis not present

## 2017-02-18 DIAGNOSIS — Z7982 Long term (current) use of aspirin: Secondary | ICD-10-CM | POA: Diagnosis not present

## 2017-02-18 DIAGNOSIS — C8510 Unspecified B-cell lymphoma, unspecified site: Secondary | ICD-10-CM | POA: Diagnosis not present

## 2017-02-18 DIAGNOSIS — Z7952 Long term (current) use of systemic steroids: Secondary | ICD-10-CM | POA: Insufficient documentation

## 2017-02-18 DIAGNOSIS — E785 Hyperlipidemia, unspecified: Secondary | ICD-10-CM | POA: Diagnosis not present

## 2017-02-18 DIAGNOSIS — Z8572 Personal history of non-Hodgkin lymphomas: Secondary | ICD-10-CM | POA: Diagnosis not present

## 2017-02-18 DIAGNOSIS — Z5111 Encounter for antineoplastic chemotherapy: Secondary | ICD-10-CM | POA: Diagnosis not present

## 2017-02-18 HISTORY — PX: IR US GUIDE VASC ACCESS LEFT: IMG2389

## 2017-02-18 HISTORY — PX: IR IMAGING GUIDED PORT INSERTION: IMG5740

## 2017-02-18 LAB — CBC WITH DIFFERENTIAL/PLATELET
BASOS ABS: 0 10*3/uL (ref 0.0–0.1)
Basophils Relative: 0 %
EOS PCT: 1 %
Eosinophils Absolute: 0.1 10*3/uL (ref 0.0–0.7)
HEMATOCRIT: 35.8 % — AB (ref 36.0–46.0)
Hemoglobin: 11.8 g/dL — ABNORMAL LOW (ref 12.0–15.0)
LYMPHS PCT: 10 %
Lymphs Abs: 0.6 10*3/uL — ABNORMAL LOW (ref 0.7–4.0)
MCH: 30.6 pg (ref 26.0–34.0)
MCHC: 33 g/dL (ref 30.0–36.0)
MCV: 92.7 fL (ref 78.0–100.0)
MONO ABS: 1.1 10*3/uL — AB (ref 0.1–1.0)
MONOS PCT: 19 %
Neutro Abs: 4 10*3/uL (ref 1.7–7.7)
Neutrophils Relative %: 70 %
PLATELETS: 78 10*3/uL — AB (ref 150–400)
RBC: 3.86 MIL/uL — ABNORMAL LOW (ref 3.87–5.11)
RDW: 16.2 % — AB (ref 11.5–15.5)
WBC: 5.8 10*3/uL (ref 4.0–10.5)

## 2017-02-18 LAB — PROTIME-INR
INR: 1.16
Prothrombin Time: 14.8 seconds (ref 11.4–15.2)

## 2017-02-18 LAB — CHROMOSOME ANALYSIS, BONE MARROW

## 2017-02-18 MED ORDER — CEFAZOLIN SODIUM-DEXTROSE 2-4 GM/100ML-% IV SOLN: 2.0000 g | INTRAVENOUS | Status: DC

## 2017-02-18 MED ORDER — CEFAZOLIN SODIUM-DEXTROSE 2-4 GM/100ML-% IV SOLN
INTRAVENOUS | Status: AC
  Administered 2017-02-18: 2000 mg
  Filled 2017-02-18: qty 100

## 2017-02-18 MED ORDER — NALOXONE HCL 0.4 MG/ML IJ SOLN
INTRAMUSCULAR | Status: AC
  Filled 2017-02-18: qty 1

## 2017-02-18 MED ORDER — LIDOCAINE-EPINEPHRINE (PF) 2 %-1:200000 IJ SOLN
INTRAMUSCULAR | Status: AC | PRN
  Administered 2017-02-18: 20 mL

## 2017-02-18 MED ORDER — HEPARIN SOD (PORK) LOCK FLUSH 100 UNIT/ML IV SOLN
INTRAVENOUS | Status: AC
  Filled 2017-02-18: qty 5

## 2017-02-18 MED ORDER — FENTANYL CITRATE (PF) 100 MCG/2ML IJ SOLN
INTRAMUSCULAR | Status: AC | PRN
  Administered 2017-02-18: 50 ug via INTRAVENOUS
  Administered 2017-02-18: 25 ug via INTRAVENOUS

## 2017-02-18 MED ORDER — FLUMAZENIL 0.5 MG/5ML IV SOLN
INTRAVENOUS | Status: AC
  Filled 2017-02-18: qty 5

## 2017-02-18 MED ORDER — SODIUM CHLORIDE 0.9 % IV SOLN
INTRAVENOUS | Status: DC
  Administered 2017-02-18: 09:00:00 via INTRAVENOUS

## 2017-02-18 MED ORDER — LIDOCAINE HCL 1 % IJ SOLN
INTRAMUSCULAR | Status: AC
  Filled 2017-02-18: qty 20

## 2017-02-18 MED ORDER — MIDAZOLAM HCL 2 MG/2ML IJ SOLN
INTRAMUSCULAR | Status: AC | PRN
  Administered 2017-02-18: 1 mg via INTRAVENOUS
  Administered 2017-02-18: 0.5 mg via INTRAVENOUS

## 2017-02-18 MED ORDER — LIDOCAINE-EPINEPHRINE (PF) 2 %-1:200000 IJ SOLN
INTRAMUSCULAR | Status: AC
  Filled 2017-02-18: qty 20

## 2017-02-18 MED ORDER — MIDAZOLAM HCL 2 MG/2ML IJ SOLN
INTRAMUSCULAR | Status: AC
  Filled 2017-02-18: qty 4

## 2017-02-18 MED ORDER — FENTANYL CITRATE (PF) 100 MCG/2ML IJ SOLN
INTRAMUSCULAR | Status: AC
  Filled 2017-02-18: qty 4

## 2017-02-18 NOTE — Discharge Instructions (Signed)
May remove bandage in 24 hours and shower. Call MD for redness, drainage, or swelling at port site. Call MD for fever or pain.      Implanted Port Insertion, Care After This sheet gives you information about how to care for yourself after your procedure. Your health care provider may also give you more specific instructions. If you have problems or questions, contact your health care provider. What can I expect after the procedure? After your procedure, it is common to have:  Discomfort at the port insertion site.  Bruising on the skin over the port. This should improve over 3-4 days.  Follow these instructions at home: [facility] care  After your port is placed, you will get a manufacturer's information card. The card has information about your port. Keep this card with you at all times.  Take care of the port as told by your health care provider. Ask your health care provider if you or a family member can get training for taking care of the port at home. A home health care nurse may also take care of the port.  Make sure to remember what type of port you have. Incision care  Follow instructions from your health care provider about how to take care of your port insertion site. Make sure you: ? Wash your hands with soap and water before you change your bandage (dressing). If soap and water are not available, use hand sanitizer. ? Change your dressing as told by your health care provider. ? Leave stitches (sutures), skin glue, or adhesive strips in place. These skin closures may need to stay in place for 2 weeks or longer. If adhesive strip edges start to loosen and curl up, you may trim the loose edges. Do not remove adhesive strips completely unless your health care provider tells you to do that.  Check your port insertion site every day for signs of infection. Check for: ? More redness, swelling, or pain. ? More fluid or blood. ? Warmth. ? Pus or a bad smell. General  instructions  Do not take baths, swim, or use a hot tub until your health care provider approves.  Do not lift anything that is heavier than 10 lb (4.5 kg) for a week, or as told by your health care provider.  Ask your health care provider when it is okay to: ? Return to work or school. ? Resume usual physical activities or sports.  Do not drive for 24 hours if you were given a medicine to help you relax (sedative).  Take over-the-counter and prescription medicines only as told by your health care provider.  Wear a medical alert bracelet in case of an emergency. This will tell any health care providers that you have a port.  Keep all follow-up visits as told by your health care provider. This is important. Contact a health care provider if:  You cannot flush your port with saline as directed, or you cannot draw blood from the port.  You have a fever or chills.  You have more redness, swelling, or pain around your port insertion site.  You have more fluid or blood coming from your port insertion site.  Your port insertion site feels warm to the touch.  You have pus or a bad smell coming from the port insertion site. Get help right away if:  You have chest pain or shortness of breath.  You have bleeding from your port that you cannot control. Summary  Take care of the port  as told by your health care provider.  Change your dressing as told by your health care provider.  Keep all follow-up visits as told by your health care provider. This information is not intended to replace advice given to you by your health care provider. Make sure you discuss any questions you have with your health care provider. Document Released: 04/22/2013 Document Revised: 05/23/2016 Document Reviewed: 05/23/2016 Elsevier Interactive Patient Education  2017 [place].     Moderate Conscious Sedation, Adult, Care After These instructions provide you with information about caring for yourself  after your procedure. Your health care provider may also give you more specific instructions. Your treatment has been planned according to current medical practices, but problems sometimes occur. Call your health care provider if you have any problems or questions after your procedure. What can I expect after the procedure? After your procedure, it is common:  To feel sleepy for several hours.  To feel clumsy and have poor balance for several hours.  To have poor judgment for several hours.  To vomit if you eat too soon.  Follow these instructions at home: For at least 24 hours after the procedure:   Do not: ? Participate in activities where you could fall or become injured. ? Drive. ? Use heavy machinery. ? Drink alcohol. ? Take sleeping pills or medicines that cause drowsiness. ? Make important decisions or sign legal documents. ? Take care of children on your own.  Rest. Eating and drinking  Follow the diet recommended by your health care provider.  If you vomit: ? Drink water, juice, or soup when you can drink without vomiting. ? Make sure you have little or no nausea before eating solid foods. General instructions  Have a responsible adult stay with you until you are awake and alert.  Take over-the-counter and prescription medicines only as told by your health care provider.  If you smoke, do not smoke without supervision.  Keep all follow-up visits as told by your health care provider. This is important. Contact a health care provider if:  You keep feeling nauseous or you keep vomiting.  You feel light-headed.  You develop a rash.  You have a fever. Get help right away if:  You have trouble breathing. This information is not intended to replace advice given to you by your health care provider. Make sure you discuss any questions you have with your health care provider. Document Released: 04/22/2013 Document Revised: 12/05/2015 Document Reviewed:  10/22/2015 Elsevier Interactive Patient Education  [employer].

## 2017-02-18 NOTE — Procedures (Signed)
Pre Procedure Dx: Poor venous access Post Procedural Dx: Same  Successful placement of left IJ approach port-a-cath with tip at the superior caval atrial junction. The catheter is ready for immediate use.  Estimated Blood Loss: Minimal  Complications: None immediate.  Jay Melodye Swor, MD Pager #: [phone]   

## 2017-02-18 NOTE — H&P (Signed)
Chief Complaint: Patient was seen in consultation today for non-Hodgkin's lymphoma  Referring Physician(s): Perlov,Mikhail G  Supervising Physician: Sandi Mariscal  Patient Status: [facility] - Out-pt  History of Present Illness: Erica Woodward is a 71 y.o. 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 This was confirmed with bone marrow biopsy as well as para-aortic lymph node biopsy 02/01/17.  IR consulted for Port-A-Cath placement at the request of Dr. Lebron Conners.   Patient presents for procedure today in her usual state of health.  She has been NPO.  She does not take blood thinners.   Past Medical History:  Diagnosis Date  . Basal cell carcinoma   . Cancer [facility])    carcinomatosis  . Chronic headaches   . DDD (degenerative disc disease), lumbar    L4-5 right foraminal and spinal stenosis causng lumbar radiculopathy  . Diverticulosis   . GERD (gastroesophageal reflux disease)   . HLD (hyperlipidemia)   . Hx of adenomatous polyp of colon 05/21/2016  . Hypertension   . Renal disorder    kidney transplant 2005  . Status post dilation of esophageal narrowing     Past Surgical History:  Procedure Laterality Date  . COLONOSCOPY    . KIDNEY TRANSPLANT  07/26/2013  . LUMBAR PUNCTURE W/ INTRATHECAL CHEMOTHERAPY  08/19/14  . PORTACATH PLACEMENT    . UPPER GASTROINTESTINAL ENDOSCOPY      Allergies: Patient has no known allergies.  Medications: Prior to Admission medications   Medication Sig Start Date End Date Taking? Authorizing Provider  aspirin EC 81 MG tablet Take 81 mg by mouth daily.   Yes [provider]  atorvastatin (LIPITOR) 40 MG tablet TAKE 1 TABLET (40 MG TOTAL) BY MOUTH DAILY. 09/25/16  Yes Susy Frizzle, MD  cetirizine (ZYRTEC) 10 MG tablet Take 10 mg by mouth at bedtime.   Yes [provider]    esomeprazole (NEXIUM) 40 MG capsule Take 1 capsule (40 mg total) by mouth daily. 04/18/16  Yes Susy Frizzle, MD  gabapentin (NEURONTIN) 300 MG capsule Take 1 capsule (300 mg total) by mouth 3 (three) times daily. Patient taking differently: Take 900 mg by mouth 2 (two) times daily.  10/03/16  Yes Susy Frizzle, MD  magnesium oxide (MAG-OX) 400 MG tablet Take 400 mg by mouth 2 (two) times daily.    Yes [provider]  predniSONE (DELTASONE) 10 MG tablet Take 10 mg by mouth daily with breakfast.   Yes [provider]  tacrolimus (PROGRAF) 0.5 MG capsule Take 1.5 mg by mouth 2 (two) times daily. Take 3 capsules (1.5 mg) BID 06/28/14  Yes [provider]  LORazepam (ATIVAN) 0.5 MG tablet Take 1 tablet (0.5 mg total) by mouth every 6 (six) hours as needed (Nausea and vomiting). 02/13/17   Ardath Sax, MD  morphine (MS CONTIN) 15 MG 12 hr tablet Take 1 tablet (15 mg total) by mouth every 12 (twelve) hours. 02/13/17   Ardath Sax, MD  morphine (MSIR) 15 MG tablet Take 1 tablet (15 mg total) by mouth every 2 (two) hours as needed for severe pain (cancer-associated pain). 02/13/17   Ardath Sax, MD  predniSONE (DELTASONE) 20 MG tablet 3 tabs poqday 1-2, 2 tabs poqday 3-4, 1 tab poqday 5-6 Patient not taking: Reported on 02/04/2017 01/14/17   Susy Frizzle, MD  prochlorperazine (COMPAZINE) 10 MG tablet  Take 1 tablet (10 mg total) by mouth every 6 (six) hours as needed for nausea or vomiting. 02/13/17   Ardath Sax, MD     Family History  Problem Relation Age of Onset  . Kidney disease Father   . Diabetes Father   . Lung cancer Brother   . Seizures Brother   . Seizures Sister   . Breast cancer Daughter   . Heart disease Brother     Social History   Social History  . Marital status: Married    Spouse name: N/A  . Number of children: 4  . Years of education: N/A   Occupational History  . retired    Social History Main Topics  . Smoking  status: Never Smoker  . Smokeless tobacco: Never Used  . Alcohol use No  . Drug use: No  . Sexual activity: Not Asked   Other Topics Concern  . None   Social History Narrative  . None    Review of Systems  Constitutional: Positive for fatigue. Negative for fever.  Respiratory: Negative for cough and shortness of breath.   Cardiovascular: Negative for chest pain.  Gastrointestinal: Negative for abdominal pain.  Psychiatric/Behavioral: Negative for behavioral problems and confusion.    Vital Signs: BP 123/83 (BP Location: Right Arm)   Pulse (!) 104   Temp 98.3 F (36.8 C) (Oral)   Resp 16   SpO2 97%   Physical Exam  Constitutional: She is oriented to person, place, and time. She appears well-developed.  Cardiovascular: Normal rate, regular rhythm and normal heart sounds.   Pulmonary/Chest: Effort normal and breath sounds normal. No respiratory distress.  Abdominal: Soft.  Neurological: She is alert and oriented to person, place, and time.  Skin: Skin is warm and dry.  Psychiatric: She has a normal mood and affect. Her behavior is normal. Judgment and thought content normal.  Nursing note and vitals reviewed.   Mallampati Score:  MD Evaluation Airway: WNL Heart: WNL Abdomen: WNL Chest/ Lungs: WNL ASA  Classification: 3 Mallampati/Airway Score: Two  Imaging: Ct Abdomen Pelvis Wo Contrast  Result Date: 02/04/2017 CLINICAL DATA:  Abdominal pain.  Diarrhea.  Vomiting. EXAM: CT ABDOMEN AND PELVIS WITHOUT CONTRAST TECHNIQUE: Multidetector CT imaging of the abdomen and pelvis was performed following the standard protocol without IV contrast. COMPARISON:  PET-CT 01/25/2017 FINDINGS: Lower chest: Motion artifact. Scattered atelectasis. No confluent consolidation or pleural fluid. Upper normal heart size with coronary artery calcifications. A lymph node adjacent to the distal esophagus measures 5 mm, previously 4 mm. Hepatobiliary: Unchanged cyst in the left lobe of the liver.  Gallbladder physiologically distended, no calcified stone. No biliary dilatation. Pancreas: Parenchymal atrophy. No ductal dilatation or inflammation. Spleen: Normal in size without focal abnormality. Adrenals/Urinary Tract: No adrenal nodule. Native kidneys are markedly atrophic. Transplant kidney in the right lower quadrant without transplant hydronephrosis. Urinary bladder is physiologically distended. No bladder wall thickening. Stomach/Bowel: Small hiatal hernia. Stomach physiologically distended. Enteric contrast within normal caliber small bowel, just reaching the cecum. No bowel obstruction. Small volume of colonic stool. Sigmoid colonic diverticulosis without acute diverticulitis. No liquid stool. No colonic inflammation or wall thickening. Vascular/Lymphatic: Atherosclerosis of the abdominal aorta without aneurysm. Rather extensive retrocrural, retroperitoneal, bilateral common ileum cool an left external iliac adenopathy, as characterized on recent PET. Small presacral node measures 11 mm, unchanged additional small perirectal nodes are also unchanged. Minimal stranding about enlarged left periaortic nodes likely related to recent biopsy. Some of the stranding tracks along the left  iliopsoas muscle anteriorly with small volume of fluid. Reproductive: A 2.9 X 2.3 cm right adnexal cystic structure is unchanged, not hypermetabolic on prior PET. Uterus is atrophic, normal for age. Other: No free air or ascites. Musculoskeletal: Degenerative change in the lumbar spine. There are no acute or suspicious osseous abnormalities. No evidence of adverse sequela post recent iliac bone biopsy. IMPRESSION: 1. Mild left retroperitoneal stranding/fluid about the left psoas muscle and periaortic lymph nodes, likely sequela of recent retroperitoneal lymph node biopsy. 2. Rather extensive retroperitoneal, retrocrural and left iliac adenopathy, and mild perirectal/presacral adenopathy as characterized on recent PET. 3. No  bowel obstruction, enteric contrast reaches the colon. Colonic diverticulosis without diverticulitis. 4. Atrophic native kidneys with right lower quadrant renal transplant, no transplant hydronephrosis. 5. Aortic atherosclerosis. Electronically Signed   By: Jeb Levering M.D.   On: 02/04/2017 23:54   Mr Lumbar Spine Wo Contrast  Result Date: 01/25/2017 CLINICAL DATA:  Right leg pain.  History of non-Hodgkin's lymphoma. EXAM: MRI LUMBAR SPINE WITHOUT CONTRAST TECHNIQUE: Multiplanar, multisequence MR imaging of the lumbar spine was performed. No intravenous contrast was administered. COMPARISON:  CT scan 12/30/2016 FINDINGS: Segmentation: 5 lumbar type vertebral bodies. The last full intervertebral disc space is labeled L5-S1. This correlates with lumbar spine radiographs 01/09/2017 Alignment: Degenerative lumbar spondylosis with mild non isthmic spondylolisthesis of L4 due to facet disease. No pars defects. The remaining lumbar vertebral bodies are normally aligned. Vertebrae:  No bone lesions or fracture. Conus medullaris: Extends to the bottom of T12 level and appears normal. Paraspinal and other soft tissues: The markedly atrophied kidneys consistent with renal failure. Retroperitoneal lymphadenopathy as noted on the recent CT scan consistent with known diagnosis of lymphoma. A right pelvic transplant kidney is noted. Disc levels: T12-L1:  No significant findings. L1-2: Mild diffuse annular bulge with mild lateral recess encroachment bilaterally. No significant spinal or foraminal stenosis. L2-3: Diffuse bulging annulus, osteophytic ridging and facet disease contributing to moderate bilateral lateral recess stenosis and mild bilateral foraminal encroachment. L3-4: Diffuse bulging annulus and advanced facet disease with mild spinal stenosis and moderate bilateral lateral recess stenosis. No significant foraminal stenosis. L4-5: Advanced disc disease and facet disease. See bulging uncovered disc, osteophytic  ridging and a broad-based right foraminal/ extraforaminal and far lateral disc protrusion. There is severe spinal stenosis and bilateral lateral recess stenosis and significant right foraminal stenosis. This likely accounts for the patient's symptoms. L5-S1: Diffuse annular bulge and severe facet disease. No significant spinal stenosis. Mild left lateral recess stenosis. Mild extraforaminal encroachment on the right L5 nerve root. IMPRESSION: Degenerative lumbar spondylosis with multilevel disc disease and facet disease. The most significant findings are at L4-5 where there is severe multifactorial spinal and bilateral lateral recess stenosis and significant right foraminal stenosis. Electronically Signed   By: Marijo Sanes M.D.   On: 01/25/2017 15:35   Nm Pet Image Initial (pi) Skull Base To Thigh  Result Date: 01/25/2017 CLINICAL DATA:  Subsequent treatment strategy for B-cell lymphoma, with retroperitoneal adenopathy. EXAM: NUCLEAR MEDICINE PET SKULL BASE TO THIGH TECHNIQUE: 11.2 mCi F-18 FDG was injected intravenously. Full-ring PET imaging was performed from the skull base to thigh after the radiotracer. CT data was obtained and used for attenuation correction and anatomic localization. FASTING BLOOD GLUCOSE:  Value: 104 mg/dl COMPARISON:  Multiple exams, including CT abdomen from 12/30/2016 FINDINGS: NECK No hypermetabolic lymph nodes in the neck. CHEST A 4 mm paraesophageal lymph node on image 71/4 has a maximum standard uptake value of 12.3, favoring  malignant involvement. Small type 1 hiatal hernia. Calcified granuloma posteriorly in the left upper lobe on image 15/8. ABDOMEN/PELVIS Extensive hypermetabolic retrocrural, periaortic/retroperitoneal, bilateral common iliac, left external iliac, sigmoid mesocolon, and perirectal space hypermetabolic adenopathy. One of the larger nodes is in the left periaortic chain and measures 2.0 cm in short axis on image 133/4, with maximum standard uptake value 55.3.  Even the smaller lymph nodes, such as the perirectal space nodule measuring 0.7 by 0.9 cm on image 176/4, are highly hypermetabolic, maximum SUV 84.1. Severely atrophic native kidneys. Right pelvic transplant kidney. Photopenic cyst anteriorly in segment 2 of the liver. Photopenic cystic right adnexal lesion, 2.9 by 2.3 cm. Aortoiliac atherosclerotic vascular disease. SKELETON No focal hypermetabolic activity to suggest skeletal metastasis. Anterolisthesis at L4-5. IMPRESSION: 1. Intensely hypermetabolic retroperitoneal, pelvic, sigmoid mesocolon, and perirectal adenopathy compatible with malignancy. There is also a small but hypermetabolic paraesophageal lymph node in the thorax. Appearance compatible with active lymphoma. 2. Other imaging findings of potential clinical significance: Aortic Atherosclerosis (ICD10-I70.0). Photopenic hepatic and right adnexal cysts. Severely atrophic native kidneys with right pelvic transplant kidney. Small type 1 hiatal hernia. Electronically Signed   By: Van Clines M.D.   On: 01/25/2017 14:17   Ct Biopsy  Result Date: 02/01/2017 INDICATION: 71 year old female with a history of lymphoma. EXAM: CT-GUIDED BONE MARROW BIOPSY. CT-GUIDED BIOPSY OF PARA-AORTIC LYMPH NODE MEDICATIONS: None. ANESTHESIA/SEDATION: Moderate (conscious) sedation was employed during this procedure. A total of Versed 2.0 mg and Fentanyl 100 mcg was administered intravenously. Moderate Sedation Time: 25 minutes. The patient's level of consciousness and vital signs were monitored continuously by radiology nursing throughout the procedure under my direct supervision. FLUOROSCOPY TIME:  CT COMPLICATIONS: None PROCEDURE: The procedure risks, benefits, and alternatives were explained to the patient. Questions regarding the procedure were encouraged and answered. The patient understands and consents to the procedure. Scout CT of the pelvis was performed for surgical planning purposes. The posterior pelvis  was prepped with Betadinein a sterile fashion, and a sterile drape was applied covering the operative field. A sterile gown and sterile gloves were used for the procedure. Local anesthesia was provided with 1% Lidocaine. We targeted the right posterior iliac bone for biopsy. The skin and subcutaneous tissues were infiltrated with 1% lidocaine without epinephrine. A small stab incision was made with an 11 blade scalpel, and an 11 gauge Murphy needle was advanced with CT guidance to the posterior cortex. Manual forced was used to advance the needle through the posterior cortex and the stylet was removed. A bone marrow aspirate was retrieved and passed to a cytotechnologist in the room. The Murphy needle was then advanced without the stylet for a core biopsy. The core biopsy was retrieved and also passed to a cytotechnologist. Manual pressure was used for hemostasis and a sterile dressing was placed. We then repositioned to a pre marked site on the posterior left lumbar region, which had been prepped and draped in the usual sterile fashion. 1% lidocaine was then again used for local anesthesia. Using CT guidance, 17 gauge guide needle was advanced a 2 left-sided periaortic lymph nodal mass. Multiple 18 gauge core biopsy were acquired. Needle was removed and sterile bandage was placed. No complications were encountered no significant blood loss was encountered. Patient tolerated the procedure well and remained hemodynamically stable throughout. IMPRESSION: Status post CT-guided bone marrow biopsy, as well as CT-guided biopsy of para-aortic nodal tissue. Tissue specimen sent to pathology for complete histopathologic analysis. Signed, Dulcy Fanny. Earleen Newport, DO [facility] Radiology Electronically Signed   By: Corrie Mckusick D.O.   On: 02/01/2017 12:40   Ct Biopsy  Result Date: 02/01/2017 INDICATION: 71 year old female with a history of lymphoma. EXAM: CT-GUIDED BONE MARROW  BIOPSY. CT-GUIDED BIOPSY OF PARA-AORTIC LYMPH NODE MEDICATIONS: None. ANESTHESIA/SEDATION: Moderate (conscious) sedation was employed during this procedure. A total of Versed 2.0 mg and Fentanyl 100 mcg was administered intravenously. Moderate Sedation Time: 25 minutes. The patient's level of consciousness and vital signs were monitored continuously by radiology nursing throughout the procedure under my direct supervision. FLUOROSCOPY TIME:  CT COMPLICATIONS: None PROCEDURE: The procedure risks, benefits, and alternatives were explained to the patient. Questions regarding the procedure were encouraged and answered. The patient understands and consents to the procedure. Scout CT of the pelvis was performed for surgical planning purposes. The posterior pelvis was prepped with Betadinein a sterile fashion, and a sterile drape was applied covering the operative field. A sterile gown and sterile gloves were used for the procedure. Local anesthesia was provided with 1% Lidocaine. We targeted the right posterior iliac bone for biopsy. The skin and subcutaneous tissues were infiltrated with 1% lidocaine without epinephrine. A small stab incision was made with an 11 blade scalpel, and an 11 gauge Murphy needle was advanced with CT guidance to the posterior cortex. Manual forced was used to advance the needle through the posterior cortex and the stylet was removed. A bone marrow aspirate was retrieved and passed to a cytotechnologist in the room. The Murphy needle was then advanced without the stylet for a core biopsy. The core biopsy was retrieved and also passed to a cytotechnologist. Manual pressure was used for hemostasis and a sterile dressing was placed. We then repositioned to a pre marked site on the posterior left lumbar region, which had been prepped and draped in the usual sterile fashion. 1% lidocaine was then again used for local anesthesia. Using CT guidance, 17 gauge guide needle was advanced a 2 left-sided  periaortic lymph nodal mass. Multiple 18 gauge core biopsy were acquired. Needle was removed and sterile bandage was placed. No complications were encountered no significant blood loss was encountered. Patient tolerated the procedure well and remained hemodynamically stable throughout. IMPRESSION: Status post CT-guided bone marrow biopsy, as well as CT-guided biopsy of para-aortic nodal tissue. Tissue specimen sent to pathology for complete histopathologic analysis. Signed, Dulcy Fanny. Earleen Newport, DO [facility] Electronically Signed   By: Corrie Mckusick D.O.   On: 02/01/2017 12:40   Ct Bone Marrow Biopsy & Aspiration  Result Date: 02/01/2017 INDICATION: 71 year old female with a history of lymphoma. EXAM: CT-GUIDED BONE MARROW BIOPSY. CT-GUIDED BIOPSY OF PARA-AORTIC LYMPH NODE MEDICATIONS: None. ANESTHESIA/SEDATION: Moderate (conscious) sedation was employed during this procedure. A total of Versed 2.0 mg and Fentanyl 100 mcg was administered intravenously. Moderate Sedation Time: 25 minutes. The patient's level of consciousness and vital signs were monitored continuously by radiology nursing throughout the procedure under my direct supervision. FLUOROSCOPY TIME:  CT COMPLICATIONS: None PROCEDURE: The procedure risks, benefits, and alternatives were explained to the patient. Questions regarding the procedure were encouraged and answered. The patient understands and consents to the procedure. Scout CT of the pelvis was performed for surgical planning purposes. The posterior pelvis was prepped with Betadinein a sterile fashion, and a sterile drape was applied covering the operative field. A sterile gown and sterile gloves were used for the procedure. Local anesthesia was provided with 1% Lidocaine. We targeted the right posterior  iliac bone for biopsy. The skin and subcutaneous tissues were infiltrated with 1% lidocaine without epinephrine. A small stab incision  was made with an 11 blade scalpel, and an 11 gauge Murphy needle was advanced with CT guidance to the posterior cortex. Manual forced was used to advance the needle through the posterior cortex and the stylet was removed. A bone marrow aspirate was retrieved and passed to a cytotechnologist in the room. The Murphy needle was then advanced without the stylet for a core biopsy. The core biopsy was retrieved and also passed to a cytotechnologist. Manual pressure was used for hemostasis and a sterile dressing was placed. We then repositioned to a pre marked site on the posterior left lumbar region, which had been prepped and draped in the usual sterile fashion. 1% lidocaine was then again used for local anesthesia. Using CT guidance, 17 gauge guide needle was advanced a 2 left-sided periaortic lymph nodal mass. Multiple 18 gauge core biopsy were acquired. Needle was removed and sterile bandage was placed. No complications were encountered no significant blood loss was encountered. Patient tolerated the procedure well and remained hemodynamically stable throughout. IMPRESSION: Status post CT-guided bone marrow biopsy, as well as CT-guided biopsy of para-aortic nodal tissue. Tissue specimen sent to pathology for complete histopathologic analysis. Signed, Dulcy Fanny. Earleen Newport, DO [facility] Radiology Electronically Signed   By: Corrie Mckusick D.O.   On: 02/01/2017 12:40    Labs:  CBC:  Recent Labs  02/04/17 2020 02/08/17 1254 02/09/17 0658 02/18/17 0836  WBC 4.7 4.7 5.4 5.8  HGB 11.8* 11.0* 10.6* 11.8*  HCT 35.0* 33.6* 32.1* 35.8*  PLT 85* 93* 85* 78*    COAGS:  Recent Labs  12/31/16 0018 02/01/17 0710 02/18/17 0836  INR 1.12 1.10 1.16  APTT  --  28  --     BMP:  Recent Labs  02/04/17 2020 02/08/17 1254 02/09/17 0658 02/10/17 0654  NA 137 140 138 141  K 4.9 5.7* 4.9 4.0  CL 105 107 108 112*  CO2 '23 23 22 22  ' GLUCOSE 108* 105* 131* 99    BUN 22* 24* 23* 21*  CALCIUM 9.0 8.9 8.6* 8.3*  CREATININE 1.41* 1.92* 1.42* 1.24*  GFRNONAA 37* 25* 36* 43*  GFRAA 43* 29* 42* 50*    LIVER FUNCTION TESTS:  Recent Labs  01/14/17 1439 02/04/17 2020 02/08/17 1254 02/09/17 0658 02/10/17 0654  BILITOT 0.6 1.2 1.0 0.7  --   AST '22 20 21 23  ' --   ALT 25 13* 11* 11*  --   ALKPHOS 78 72 71 64  --   PROT 6.1 6.0* 5.7* 5.2*  --   ALBUMIN 3.8 3.4* 3.3* 3.0* 3.0*    TUMOR MARKERS: No results for input(s): AFPTM, CEA, CA199, CHROMGRNA in the last 8760 hours.  Assessment and Plan: Patient with past medical history of aggressive large B-cell lymphoma in the setting of chronic immunosuppression due to renal transplant in 2005 presents with complaint of disease recurrence.  IR consulted for Port-A-Cath placement at the request of Dr. Grace Isaac.  Patient presents today in their usual state of health.  She has been NPO and is not currently on blood thinners.  Risks and benefits discussed with the patient including, but not limited to bleeding, infection, pneumothorax, or fibrin sheath development and need for additional procedures. All of the patient's questions were answered, patient is agreeable to proceed. Consent signed and in chart.  Thank you for this interesting consult.  I greatly enjoyed meeting Erica Woodward and look forward to participating in their care.  A copy of this report was sent to the requesting provider on this date.  Electronically Signed: Docia Barrier, PA 02/18/2017, 9:32 AM   I spent a total of  30 Minutes   in face to face in clinical consultation, greater than 50% of which was counseling/coordinating care for infusaport placement.

## 2017-02-19 ENCOUNTER — Encounter: Payer: Self-pay | Admitting: *Deleted

## 2017-02-19 DIAGNOSIS — M48061 Spinal stenosis, lumbar region without neurogenic claudication: Secondary | ICD-10-CM | POA: Diagnosis not present

## 2017-02-19 DIAGNOSIS — Z940 Kidney transplant status: Secondary | ICD-10-CM | POA: Diagnosis not present

## 2017-02-19 DIAGNOSIS — G8929 Other chronic pain: Secondary | ICD-10-CM | POA: Diagnosis not present

## 2017-02-19 DIAGNOSIS — N183 Chronic kidney disease, stage 3 (moderate): Secondary | ICD-10-CM | POA: Diagnosis not present

## 2017-02-19 DIAGNOSIS — I129 Hypertensive chronic kidney disease with stage 1 through stage 4 chronic kidney disease, or unspecified chronic kidney disease: Secondary | ICD-10-CM | POA: Diagnosis not present

## 2017-02-19 DIAGNOSIS — C8330 Diffuse large B-cell lymphoma, unspecified site: Secondary | ICD-10-CM | POA: Diagnosis not present

## 2017-02-19 DIAGNOSIS — W19XXXD Unspecified fall, subsequent encounter: Secondary | ICD-10-CM | POA: Diagnosis not present

## 2017-02-19 DIAGNOSIS — D696 Thrombocytopenia, unspecified: Secondary | ICD-10-CM | POA: Diagnosis not present

## 2017-02-19 DIAGNOSIS — M5136 Other intervertebral disc degeneration, lumbar region: Secondary | ICD-10-CM | POA: Diagnosis not present

## 2017-02-20 DIAGNOSIS — G8929 Other chronic pain: Secondary | ICD-10-CM | POA: Diagnosis not present

## 2017-02-20 DIAGNOSIS — N183 Chronic kidney disease, stage 3 (moderate): Secondary | ICD-10-CM | POA: Diagnosis not present

## 2017-02-20 DIAGNOSIS — Z940 Kidney transplant status: Secondary | ICD-10-CM | POA: Diagnosis not present

## 2017-02-20 DIAGNOSIS — W19XXXD Unspecified fall, subsequent encounter: Secondary | ICD-10-CM | POA: Diagnosis not present

## 2017-02-20 DIAGNOSIS — C8330 Diffuse large B-cell lymphoma, unspecified site: Secondary | ICD-10-CM | POA: Diagnosis not present

## 2017-02-20 DIAGNOSIS — M5136 Other intervertebral disc degeneration, lumbar region: Secondary | ICD-10-CM | POA: Diagnosis not present

## 2017-02-20 DIAGNOSIS — D696 Thrombocytopenia, unspecified: Secondary | ICD-10-CM | POA: Diagnosis not present

## 2017-02-20 DIAGNOSIS — M48061 Spinal stenosis, lumbar region without neurogenic claudication: Secondary | ICD-10-CM | POA: Diagnosis not present

## 2017-02-20 DIAGNOSIS — I129 Hypertensive chronic kidney disease with stage 1 through stage 4 chronic kidney disease, or unspecified chronic kidney disease: Secondary | ICD-10-CM | POA: Diagnosis not present

## 2017-02-21 ENCOUNTER — Telehealth: Payer: Self-pay | Admitting: Family Medicine

## 2017-02-21 ENCOUNTER — Other Ambulatory Visit: Payer: Self-pay

## 2017-02-21 ENCOUNTER — Encounter: Payer: Self-pay | Admitting: Pharmacist

## 2017-02-21 ENCOUNTER — Encounter: Payer: Self-pay | Admitting: *Deleted

## 2017-02-21 MED ORDER — PROCHLORPERAZINE MALEATE 10 MG PO TABS: 10.0000 mg | ORAL_TABLET | Freq: Four times a day (QID) | ORAL | 1 refills | Status: DC | PRN

## 2017-02-21 MED ORDER — LORAZEPAM 0.5 MG PO TABS
0.5000 mg | ORAL_TABLET | Freq: Four times a day (QID) | ORAL | 0 refills | Status: DC | PRN
Start: 2017-02-21 — End: 2017-06-21

## 2017-02-21 MED ORDER — ALLOPURINOL 300 MG PO TABS: 300.0000 mg | ORAL_TABLET | Freq: Every day | ORAL | 3 refills | Status: DC

## 2017-02-21 MED ORDER — LIDOCAINE-PRILOCAINE 2.5-2.5 % EX CREA
TOPICAL_CREAM | CUTANEOUS | 3 refills | Status: DC
Start: 2017-02-21 — End: 2017-06-21

## 2017-02-21 MED ORDER — DEXAMETHASONE 4 MG PO TABS
8.0000 mg | ORAL_TABLET | Freq: Every day | ORAL | 1 refills | Status: DC
Start: 2017-02-21 — End: 2017-06-21

## 2017-02-21 MED ORDER — ONDANSETRON HCL 8 MG PO TABS: 8.0000 mg | ORAL_TABLET | Freq: Two times a day (BID) | ORAL | 1 refills | Status: DC | PRN

## 2017-02-21 MED ORDER — ACYCLOVIR 400 MG PO TABS: 400.0000 mg | ORAL_TABLET | Freq: Every day | ORAL | 3 refills | Status: DC

## 2017-02-21 NOTE — Addendum Note (Signed)
Addended by: Arty Baumgartner on: 02/21/2017 03:11 PM   Modules accepted: Orders

## 2017-02-21 NOTE — Telephone Encounter (Signed)
Erica Woodward OT from [facility] call and states that she went out to see this pt yesterday and the family was informing her that the pt has not had a BM in 2 weeks and she is to start Chemo on Friday. What can she take?

## 2017-02-22 ENCOUNTER — Ambulatory Visit: Payer: Medicare HMO

## 2017-02-22 ENCOUNTER — Other Ambulatory Visit (HOSPITAL_BASED_OUTPATIENT_CLINIC_OR_DEPARTMENT_OTHER): Payer: Medicare HMO

## 2017-02-22 ENCOUNTER — Encounter: Payer: Self-pay | Admitting: Hematology and Oncology

## 2017-02-22 ENCOUNTER — Ambulatory Visit (HOSPITAL_BASED_OUTPATIENT_CLINIC_OR_DEPARTMENT_OTHER): Payer: Medicare HMO

## 2017-02-22 ENCOUNTER — Telehealth: Payer: Self-pay | Admitting: Hematology and Oncology

## 2017-02-22 ENCOUNTER — Ambulatory Visit (HOSPITAL_BASED_OUTPATIENT_CLINIC_OR_DEPARTMENT_OTHER): Payer: Medicare HMO | Admitting: Hematology and Oncology

## 2017-02-22 VITALS — BP 106/61 | HR 102 | Temp 98.5°F | Resp 19 | Ht 64.0 in | Wt 223.7 lb

## 2017-02-22 VITALS — BP 127/76 | HR 77 | Temp 99.0°F | Resp 16

## 2017-02-22 DIAGNOSIS — R1030 Lower abdominal pain, unspecified: Secondary | ICD-10-CM

## 2017-02-22 DIAGNOSIS — Z5111 Encounter for antineoplastic chemotherapy: Secondary | ICD-10-CM

## 2017-02-22 DIAGNOSIS — Z5112 Encounter for antineoplastic immunotherapy: Secondary | ICD-10-CM

## 2017-02-22 DIAGNOSIS — C8333 Diffuse large B-cell lymphoma, intra-abdominal lymph nodes: Secondary | ICD-10-CM

## 2017-02-22 DIAGNOSIS — Z95828 Presence of other vascular implants and grafts: Secondary | ICD-10-CM

## 2017-02-22 DIAGNOSIS — C8330 Diffuse large B-cell lymphoma, unspecified site: Secondary | ICD-10-CM

## 2017-02-22 LAB — COMPREHENSIVE METABOLIC PANEL
ALT: 10 U/L (ref 0–55)
ANION GAP: 9 meq/L (ref 3–11)
AST: 13 U/L (ref 5–34)
Albumin: 3.2 g/dL — ABNORMAL LOW (ref 3.5–5.0)
Alkaline Phosphatase: 97 U/L (ref 40–150)
BUN: 17.7 mg/dL (ref 7.0–26.0)
CALCIUM: 8.9 mg/dL (ref 8.4–10.4)
CHLORIDE: 110 meq/L — AB (ref 98–109)
CO2: 22 meq/L (ref 22–29)
CREATININE: 1 mg/dL (ref 0.6–1.1)
EGFR: 59 mL/min/{1.73_m2} — ABNORMAL LOW (ref >90)
Glucose: 131 mg/dl (ref 70–140)
POTASSIUM: 4.2 meq/L (ref 3.5–5.1)
Sodium: 141 mEq/L (ref 136–145)
Total Bilirubin: 1.23 mg/dL — ABNORMAL HIGH (ref 0.20–1.20)
Total Protein: 5.6 g/dL — ABNORMAL LOW (ref 6.4–8.3)

## 2017-02-22 LAB — CBC WITH DIFFERENTIAL/PLATELET
BASO%: 0.1 % (ref 0.0–2.0)
BASOS ABS: 0 10*3/uL (ref 0.0–0.1)
EOS%: 0.3 % (ref 0.0–7.0)
Eosinophils Absolute: 0 10*3/uL (ref 0.0–0.5)
HEMATOCRIT: 36.2 % (ref 34.8–46.6)
HGB: 11.6 g/dL (ref 11.6–15.9)
LYMPH#: 0.7 10*3/uL — AB (ref 0.9–3.3)
LYMPH%: 9.8 % — AB (ref 14.0–49.7)
MCH: 31.1 pg (ref 25.1–34.0)
MCHC: 32 g/dL (ref 31.5–36.0)
MCV: 97.1 fL (ref 79.5–101.0)
MONO#: 1.2 10*3/uL — ABNORMAL HIGH (ref 0.1–0.9)
MONO%: 17.5 % — ABNORMAL HIGH (ref 0.0–14.0)
NEUT#: 4.9 10*3/uL (ref 1.5–6.5)
NEUT%: 72.3 % (ref 38.4–76.8)
Platelets: 85 10*3/uL — ABNORMAL LOW (ref 145–400)
RBC: 3.73 10*6/uL (ref 3.70–5.45)
RDW: 16.9 % — ABNORMAL HIGH (ref 11.2–14.5)
WBC: 6.8 10*3/uL (ref 3.9–10.3)

## 2017-02-22 LAB — LACTATE DEHYDROGENASE: LDH: 291 U/L — AB (ref 125–245)

## 2017-02-22 LAB — URIC ACID: URIC ACID, SERUM: 7.3 mg/dL (ref 2.6–7.4)

## 2017-02-22 LAB — MAGNESIUM: Magnesium: 2.2 mg/dl (ref 1.5–2.5)

## 2017-02-22 LAB — TECHNOLOGIST REVIEW

## 2017-02-22 MED ORDER — ACETAMINOPHEN 325 MG PO TABS
ORAL_TABLET | ORAL | Status: AC
  Filled 2017-02-22: qty 2

## 2017-02-22 MED ORDER — SODIUM CHLORIDE 0.9% FLUSH
10.0000 mL | INTRAVENOUS | Status: DC | PRN
  Administered 2017-02-22: 10 mL via INTRAVENOUS
  Filled 2017-02-22: qty 10

## 2017-02-22 MED ORDER — DIPHENHYDRAMINE HCL 25 MG PO CAPS
ORAL_CAPSULE | ORAL | Status: AC
  Filled 2017-02-22: qty 2

## 2017-02-22 MED ORDER — HEPARIN SOD (PORK) LOCK FLUSH 100 UNIT/ML IV SOLN
500.0000 [IU] | Freq: Once | INTRAVENOUS | Status: AC | PRN
  Administered 2017-02-22: 500 [IU]
  Filled 2017-02-22: qty 5

## 2017-02-22 MED ORDER — PALONOSETRON HCL INJECTION 0.25 MG/5ML
INTRAVENOUS | Status: AC
  Filled 2017-02-22: qty 5

## 2017-02-22 MED ORDER — DIPHENHYDRAMINE HCL 25 MG PO CAPS
50.0000 mg | ORAL_CAPSULE | Freq: Once | ORAL | Status: AC
  Administered 2017-02-22: 50 mg via ORAL

## 2017-02-22 MED ORDER — SODIUM CHLORIDE 0.9 % IV SOLN
Freq: Once | INTRAVENOUS | Status: AC
  Administered 2017-02-22: 11:00:00 via INTRAVENOUS

## 2017-02-22 MED ORDER — PALONOSETRON HCL INJECTION 0.25 MG/5ML
0.2500 mg | Freq: Once | INTRAVENOUS | Status: AC
  Administered 2017-02-22: 0.25 mg via INTRAVENOUS

## 2017-02-22 MED ORDER — DEXAMETHASONE SODIUM PHOSPHATE 10 MG/ML IJ SOLN
INTRAMUSCULAR | Status: AC
  Filled 2017-02-22: qty 1

## 2017-02-22 MED ORDER — SODIUM CHLORIDE 0.9 % IV SOLN
375.0000 mg/m2 | Freq: Once | INTRAVENOUS | Status: AC
  Administered 2017-02-22: 800 mg via INTRAVENOUS
  Filled 2017-02-22: qty 50

## 2017-02-22 MED ORDER — SODIUM CHLORIDE 0.9 % IV SOLN
90.0000 mg/m2 | Freq: Once | INTRAVENOUS | Status: AC
  Administered 2017-02-22: 200 mg via INTRAVENOUS
  Filled 2017-02-22: qty 8

## 2017-02-22 MED ORDER — ACETAMINOPHEN 325 MG PO TABS
650.0000 mg | ORAL_TABLET | Freq: Once | ORAL | Status: AC
  Administered 2017-02-22: 650 mg via ORAL

## 2017-02-22 MED ORDER — DEXAMETHASONE SODIUM PHOSPHATE 10 MG/ML IJ SOLN
10.0000 mg | Freq: Once | INTRAMUSCULAR | Status: AC
  Administered 2017-02-22: 10 mg via INTRAVENOUS

## 2017-02-22 MED ORDER — SODIUM CHLORIDE 0.9% FLUSH
10.0000 mL | INTRAVENOUS | Status: DC | PRN
  Administered 2017-02-22: 10 mL
  Filled 2017-02-22: qty 10

## 2017-02-22 NOTE — Progress Notes (Signed)
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 Lymphoma was diagnosed in Dec 2015 and treated with dose-adjusted R-EPOCH with the patient receiving full 6 cycles of chemotherapy with 4 cycles of prophylactic intrathecal treatment. Treatment was conducted by [employer] system with complete response documented by imaging.  Now we have a pathologically-confirmed recurrence of the malignancy with appearance of the tumor cells as well as their immunohistochemical profile consistent with diffuse large B-cell lymphoma. Lymphoma does not appear to involve bone marrow at this time. Typically, salvage recurrence therapy involves chemotherapy followed by possible consolidation with high-dose chemotherapy and autologous stem cell transplant. Based on the patient's age, comorbidities, and current performance status, I do not believe her to be a candidate for treatment with R-ICE or R-GemOx combinations based on the above. Additional options available at this time include combination of rituximab plus bendamustine, ibrutinib, lenalidomide/dexamethasone with rituximab and rituximab alone.  Previously, extensive discussion regarding the choice of systemic therapy was conducted with the patient and her family. After reviewing options, there are toxicities and potential benefits, we have decided to proceed with combination of rituximab and bendamustine. Our clinic visit today, I have reviewed the proposed therapy, its alternatives, component medications, schedule administration, and potential toxicities with the patient and her daughter. And agree to proceed with treatment as recommended.  Clinical evaluation and lab work up or Mrs. to proceed with therapy today.   Plan: --Proceed with C1d1 R-Bendamustine  therapy --RTC 1wk with labs for toxicity monitoring  Voice recognition software was used and creation of this note. Despite my best effort at editing the text, some misspelling/errors may have occurred.   Orders Placed This Encounter  Procedures  . CBC with Differential    Standing Status:   Future    Standing Expiration Date:   02/22/2018  . Comprehensive metabolic panel    Standing Status:   Future    Standing Expiration Date:   02/22/2018  . Uric acid    Standing Status:   Future    Standing Expiration Date:   02/22/2018  . Magnesium    Standing Status:   Future    Standing Expiration Date:   02/22/2018    Cancer Staging No matching staging information was found for the patient.  All questions were answered.  . The patient knows to call the clinic with any problems, questions or concerns.  This note was electronically signed.    History of Presenting Illness Erica Woodward 71 y.o. followed in the [place] for monitoring and treatment of recurrent diffuse large B-cell lymphoma. Since last visit to the clinic, patient was hospitalized 07/27-29/18 with nausea, vomiting, abdominal pain and dehydration. He received rehydration therapy and improved significantly. Since discharge, has been feeling reasonably well with resolution as in vomiting, but persistent lower abdominal pain. Denies any interval fever, chills or night sweats. Neurological complaints.   Oncological/hematological History:   NHL (non-Hodgkin's lymphoma) ([place])   06/25/2014 Initial Diagnosis    Diffuse large B-cell lymphoma (followed and treated by Hessie Dibble, MD & Lovenia Shuck Despina Hidden, PA-C at [facility]); As per the last note: "She [patient] presented to an outside hospital in December 2015 with worsening nausea, vomiting, and left lower quadrant abdominal pain. CT abdomen/ pelvis was concerning for peritoneal carcinomatosis. She was transferred to [facility] service for further  management.  A CT abdomen/ pelvis with contrast on 06/17/14 showed diffuse peritoneal metastatic disease, ascites, bilateral pleural effusions, and focal thickening of a loop of small bowel.   A paracentesis performed on 12/7 came back showing a high-grade malignant neoplasm favoring lymphoma. She underwent an open exploratory laparotomy and biopsy of the thickened omentum on 12/11. Pathology showed aggressive diffuse large B-cell lymphoma with Ki-67 of 100% that was strongly positive for CD20, CD10, and BCL-6 and negative for BCL-2. FISH studies were negative for MYC, BCL6, and IgH/BCL2 translocations. EBV PCR and HIV were negative. Bone marrow biopsy showed a mild CD20+ interstitial B-cell lymphocytosis. Labs showed an elevated LDH of 326 and anemia (Hgb 10.5). She did not have a PET CT scan at diagnosis as chemotherapy was immediately started as an inpatient. CSF showed atypical lymphocytes favoring benign reactive pleocytosis but flow cytometry was not performed. She reported a 40 lb weight loss as well as progressive night sweats over 3 months. Thus, she had stage IVB disease at presentation with IPI 5.      07/02/2014 - 11/01/2014 Chemotherapy    Dose Adjusted R-EPOCH: --Cycle #1, 07/02/14: Dose Level 1 --Cycle #2, 07/26/14: Dose Level 2 --Cycle #3, 08/16/14: Dose Level 2 --Cycle #4, 09/06/14: Dose Level 2 --Cycle #5, 09/27/14: Dose Level 2 --Cycle #6, 11/01/14: Dose Level 1 -- Delayed by 1 week due to poor tolerance to treatment and decline in her performance status. Dose level reduced to 1.  Completed IT chemotherapy x 4 doses.          09/20/2014 Imaging    External PET-CT (after 4 cycles of systemic therapy): No evidence of FDG avid recurrent or metastatic disease. Interval resolution of diffuse mesenteric edema and abdominopelvic ascites. Interval resolution of small bilateral pleural effusions. Diffuse metabolic activity in the bone marrow consistent with chemotherapy treatment. Right  adnexal cyst measuring 3.3 cm. Recommend annual ultrasound surveillance for this lesion.        12/29/2016 Relapse/Recurrence    Patient presented with recurrent abdominal pain, which led to CT of the abdomen with contrast and pain medications. Imaging revealed recurrent lymphadenopathy in the abdomen strongly suspicious for possible recurrent lymphoma. Presentation was complicated by development of delirium likely due to combination of severe pain, opioid pain medications, acute kidney injury precipitated by hyperuricemia and possibly contrast administration in a dehydrated patient.  Patient required hospitalization, stabilized and improved significantly. Nevertheless, continues to have multiple problems not the least of which is persistent peripheral swelling, worsening performance status, dyspnea on exertion.      01/25/2017 Imaging    PET-CT: Intensely hypermetabolic retroperitoneal, pelvic, sigmoid mesocolon, and perirectal adenopathy compatible with malignancy. There is also a small but hypermetabolic paraesophageal lymph node in the thorax. Appearance compatible with active lymphoma.      01/25/2017 Imaging    MRI L-spine: Degenerative lumbar spondylosis with multilevel disc disease and facet disease. The most significant findings are at L4-5 where there is severe multifactorial spinal and bilateral lateral recess stenosis and significant right foraminal stenosis.      02/01/2017 Pathology Results    LN Core Bx: High-grade large cell lymphoma; IHC -- positive for LCA, CD79.9, CD20, CD10, BCL2, BCL6 & negative for CD30, CD34, CD138, CD3; BM Bx: No evidence of involvement by a monoclonal lymphoproliferative process      02/22/2017 -  Chemotherapy    Rituximab 317m/m2, d1 + Bendamustine 946mm2, d1,2 Q28 D --Cycle #1, 02/22/17:        Medical History: Past Medical History:  Diagnosis Date  . Basal cell carcinoma   . Cancer [facility])    carcinomatosis  . Chronic headaches   . DDD  (degenerative disc disease), lumbar    L4-5 right foraminal and spinal stenosis causng lumbar radiculopathy  . Diverticulosis   . GERD (gastroesophageal reflux disease)   . HLD (hyperlipidemia)   . Hx of adenomatous polyp of colon 05/21/2016  . Hypertension   . Renal disorder    kidney transplant 2005  . Status post dilation of esophageal narrowing     Surgical History: Past Surgical History:  Procedure Laterality Date  . COLONOSCOPY    . IR FLUORO GUIDE PORT INSERTION LEFT  02/18/2017  . IR US GUIDE VASC ACCESS LEFT  02/18/2017  . KIDNEY TRANSPLANT  07/26/2013  . LUMBAR PUNCTURE W/ INTRATHECAL CHEMOTHERAPY  08/19/14  . PORTACATH PLACEMENT    . UPPER GASTROINTESTINAL ENDOSCOPY      Family History: Family History  Problem Relation Age of Onset  . Kidney disease Father   . Diabetes Father   . Lung cancer Brother   . Seizures Brother   . Seizures Sister   . Breast cancer Daughter   . Heart disease Brother     Social History: Social History   Social History  . Marital status: Married    Spouse name: N/A  . Number of children: 4  . Years of education: N/A   Occupational History  . retired    Social History Main Topics  . Smoking status: Never Smoker  . Smokeless tobacco: Never Used  . Alcohol use No  . Drug use: No  . Sexual activity: Not on file   Other Topics Concern  . Not on file   Social History Narrative  . No narrative on file    Allergies: No Known Allergies  Medications:  Current Outpatient Prescriptions  Medication Sig Dispense Refill  . acyclovir (ZOVIRAX) 400 MG tablet Take 1 tablet (400 mg total) by mouth daily. 30 tablet 3  . allopurinol (ZYLOPRIM) 300 MG tablet Take 1 tablet (300 mg total) by mouth daily. 30 tablet 3  . aspirin EC 81 MG tablet Take 81 mg by mouth daily.    Marland Kitchen atorvastatin (LIPITOR) 40 MG tablet TAKE 1 TABLET (40 MG TOTAL) BY MOUTH DAILY. 90 tablet 1  . cetirizine (ZYRTEC) 10 MG tablet Take 10 mg by mouth at bedtime.    Marland Kitchen  dexamethasone (DECADRON) 4 MG tablet Take 2 tablets (8 mg total) by mouth daily. Start the day after bendamustine chemotherapy for 2 days. Take with food. 30 tablet 1  . esomeprazole (NEXIUM) 40 MG capsule Take 1 capsule (40 mg total) by mouth daily. 90 capsule 3  . gabapentin (NEURONTIN) 300 MG capsule Take 1 capsule (300 mg total) by mouth 3 (three) times daily. (Patient taking differently: Take 900 mg by mouth 2 (two) times daily. ) 270 capsule 3  . lidocaine-prilocaine (EMLA) cream Apply to affected area once 30 g 3  . LORazepam (ATIVAN) 0.5 MG tablet Take 1 tablet (0.5 mg total) by mouth every 6 (six) hours as needed (Nausea and vomiting). 30 tablet 0  . LORazepam (ATIVAN) 0.5 MG tablet Take 1 tablet (0.5 mg total) by mouth every 6 (six) hours as needed (Nausea or vomiting). 30 tablet 0  . magnesium oxide (MAG-OX) 400 MG tablet Take 400 mg by mouth 2 (two) times daily.     Marland Kitchen morphine (MS CONTIN) 15 MG 12 hr tablet Take 1 tablet (15 mg total) by  mouth every 12 (twelve) hours. 60 tablet 0  . morphine (MSIR) 15 MG tablet Take 1 tablet (15 mg total) by mouth every 2 (two) hours as needed for severe pain (cancer-associated pain). 50 tablet 0  . ondansetron (ZOFRAN) 8 MG tablet Take 1 tablet (8 mg total) by mouth 2 (two) times daily as needed for refractory nausea / vomiting. Start on day 2 after bendamustine chemotherapy. 30 tablet 1  . predniSONE (DELTASONE) 10 MG tablet Take 10 mg by mouth daily with breakfast.    . predniSONE (DELTASONE) 20 MG tablet 3 tabs poqday 1-2, 2 tabs poqday 3-4, 1 tab poqday 5-6 (Patient not taking: Reported on 02/04/2017) 12 tablet 0  . prochlorperazine (COMPAZINE) 10 MG tablet Take 1 tablet (10 mg total) by mouth every 6 (six) hours as needed for nausea or vomiting. 30 tablet 0  . prochlorperazine (COMPAZINE) 10 MG tablet Take 1 tablet (10 mg total) by mouth every 6 (six) hours as needed (Nausea or vomiting). 30 tablet 1  . tacrolimus (PROGRAF) 0.5 MG capsule Take 1.5 mg  by mouth 2 (two) times daily. Take 3 capsules (1.5 mg) BID     No current facility-administered medications for this visit.    Facility-Administered Medications Ordered in Other Visits  Medication Dose Route Frequency Provider Last Rate Last Dose  . bendamustine (BENDEKA) 200 mg in sodium chloride 0.9 % 50 mL (3.4483 mg/mL) chemo infusion  90 mg/m2 (Treatment Plan Recorded) Intravenous Once Pratik Dalziel G, MD      . heparin lock flush 100 unit/mL  500 Units Intracatheter Once PRN Nazareth Norenberg, Marinell Blight, MD      . sodium chloride flush (NS) 0.9 % injection 10 mL  10 mL Intracatheter PRN Lebron Conners, Marinell Blight, MD        Review of Systems: Review of Systems  All other systems reviewed and are negative.    PHYSICAL EXAMINATION Blood pressure 106/61, pulse (!) 102, temperature 98.5 F (36.9 C), temperature source Oral, resp. rate 19, height '5\' 4"'  (1.626 m), weight 223 lb 11.2 oz (101.5 kg), SpO2 97 %.  ECOG PERFORMANCE STATUS: 2 - Symptomatic, <50% confined to bed  Physical Exam  Constitutional: She is oriented to person, place, and time.  Patient appears fatigued.  HENT:  Head: Normocephalic and atraumatic.  Mouth/Throat: Oropharynx is clear and moist. No oropharyngeal exudate.  No oral thrush. No mucosal ulcers  Eyes: Pupils are equal, round, and reactive to light. Conjunctivae are normal. No scleral icterus.  Neck: No tracheal deviation present. No thyromegaly present.  Cardiovascular: Normal rate and regular rhythm.   No murmur heard. Distant heart sounds  Pulmonary/Chest: Effort normal. No stridor. She has no wheezes.  Upper lungs are clear to auscultation. Lower lungs with bilateral dependent crackles and possible bones to percussion at the bases of the lungs.  Abdominal: Soft. She exhibits no distension and no mass. There is tenderness. There is no rebound and no guarding.  If no palpable tenderness in the left upper quadrant of the abdomen without rebound or peritoneal irritation.  No palpable mass.  Musculoskeletal:  Tenderness to palpation over the lower lumbar spine and sacrum. No radiation down in the extremities.  Lymphadenopathy:       Head (right side): No submandibular and no occipital adenopathy present.       Head (left side): No submandibular and no occipital adenopathy present.    She has no cervical adenopathy.    She has no axillary adenopathy.  Right: No inguinal and no supraclavicular adenopathy present.       Left: No inguinal and no supraclavicular adenopathy present.  Neurological: She is oriented to person, place, and time. She has normal sensation, normal reflexes and intact cranial nerves.  Skin: She is not diaphoretic.     LABORATORY DATA: I have personally reviewed the data as listed: Appointment on 02/22/2017  Component Date Value Ref Range Status  . WBC 02/22/2017 6.8  3.9 - 10.3 10e3/uL Final  . NEUT# 02/22/2017 4.9  1.5 - 6.5 10e3/uL Final  . HGB 02/22/2017 11.6  11.6 - 15.9 g/dL Final  . HCT 02/22/2017 36.2  34.8 - 46.6 % Final  . Platelets 02/22/2017 85* 145 - 400 10e3/uL Final  . MCV 02/22/2017 97.1  79.5 - 101.0 fL Final  . MCH 02/22/2017 31.1  25.1 - 34.0 pg Final  . MCHC 02/22/2017 32.0  31.5 - 36.0 g/dL Final  . RBC 02/22/2017 3.73  3.70 - 5.45 10e6/uL Final  . RDW 02/22/2017 16.9* 11.2 - 14.5 % Final  . lymph# 02/22/2017 0.7* 0.9 - 3.3 10e3/uL Final  . MONO# 02/22/2017 1.2* 0.1 - 0.9 10e3/uL Final  . Eosinophils Absolute 02/22/2017 0.0  0.0 - 0.5 10e3/uL Final  . Basophils Absolute 02/22/2017 0.0  0.0 - 0.1 10e3/uL Final  . NEUT% 02/22/2017 72.3  38.4 - 76.8 % Final  . LYMPH% 02/22/2017 9.8* 14.0 - 49.7 % Final  . MONO% 02/22/2017 17.5* 0.0 - 14.0 % Final  . EOS% 02/22/2017 0.3  0.0 - 7.0 % Final  . BASO% 02/22/2017 0.1  0.0 - 2.0 % Final  . Sodium 02/22/2017 141  136 - 145 mEq/L Final  . Potassium 02/22/2017 4.2  3.5 - 5.1 mEq/L Final  . Chloride 02/22/2017 110* 98 - 109 mEq/L Final  . CO2 02/22/2017 22  22 - 29  mEq/L Final  . Glucose 02/22/2017 131  70 - 140 mg/dl Final   Glucose reference range is for nonfasting patients. Fasting glucose reference range is 70- 100.  Marland Kitchen BUN 02/22/2017 17.7  7.0 - 26.0 mg/dL Final  . Creatinine 02/22/2017 1.0  0.6 - 1.1 mg/dL Final  . Total Bilirubin 02/22/2017 1.23* 0.20 - 1.20 mg/dL Final  . Alkaline Phosphatase 02/22/2017 97  40 - 150 U/L Final  . AST 02/22/2017 13  5 - 34 U/L Final  . ALT 02/22/2017 10  0 - 55 U/L Final  . Total Protein 02/22/2017 5.6* 6.4 - 8.3 g/dL Final  . Albumin 02/22/2017 3.2* 3.5 - 5.0 g/dL Final  . Calcium 02/22/2017 8.9  8.4 - 10.4 mg/dL Final  . Anion Gap 02/22/2017 9  3 - 11 mEq/L Final  . EGFR 02/22/2017 59* >90 ml/min/1.73 m2 Final   eGFR is calculated using the CKD-EPI Creatinine Equation (2009)  . LDH 02/22/2017 291* 125 - 245 U/L Final  . Uric Acid, Serum 02/22/2017 7.3  2.6 - 7.4 mg/dl Final  . Magnesium 02/22/2017 2.2  1.5 - 2.5 mg/dl Final  . Technologist Review 02/22/2017 Few helmets   Final  Hospital Outpatient Visit on 02/18/2017  Component Date Value Ref Range Status  . WBC 02/18/2017 5.8  4.0 - 10.5 K/uL Final  . RBC 02/18/2017 3.86* 3.87 - 5.11 MIL/uL Final  . Hemoglobin 02/18/2017 11.8* 12.0 - 15.0 g/dL Final  . HCT 02/18/2017 35.8* 36.0 - 46.0 % Final  . MCV 02/18/2017 92.7  78.0 - 100.0 fL Final  . MCH 02/18/2017 30.6  26.0 - 34.0 pg Final  .  MCHC 02/18/2017 33.0  30.0 - 36.0 g/dL Final  . RDW 02/18/2017 16.2* 11.5 - 15.5 % Final  . Platelets 02/18/2017 78* 150 - 400 K/uL Final   Comment: REPEATED TO VERIFY SPECIMEN CHECKED FOR CLOTS PLATELET COUNT CONFIRMED BY SMEAR   . Neutrophils Relative % 02/18/2017 70  % Final  . Neutro Abs 02/18/2017 4.0  1.7 - 7.7 K/uL Final  . Lymphocytes Relative 02/18/2017 10  % Final  . Lymphs Abs 02/18/2017 0.6* 0.7 - 4.0 K/uL Final  . Monocytes Relative 02/18/2017 19  % Final  . Monocytes Absolute 02/18/2017 1.1* 0.1 - 1.0 K/uL Final  . Eosinophils Relative 02/18/2017 1  %  Final  . Eosinophils Absolute 02/18/2017 0.1  0.0 - 0.7 K/uL Final  . Basophils Relative 02/18/2017 0  % Final  . Basophils Absolute 02/18/2017 0.0  0.0 - 0.1 K/uL Final  . Prothrombin Time 02/18/2017 14.8  11.4 - 15.2 seconds Final  . INR 02/18/2017 1.16   Final       Ardath Sax, MD

## 2017-02-22 NOTE — Telephone Encounter (Signed)
Tried to call no answer and no vm 

## 2017-02-22 NOTE — Telephone Encounter (Signed)
I would try magnesium citrate 300 ml and use fleets enema x1.  Then begin miralax 17 g poqday thereafter.  She may be impacted and require manual disimpaction if not better.

## 2017-02-22 NOTE — Progress Notes (Signed)
Met w/ pt to introduce myself as her Arboriculturist.  Pt has 2 insurances so copay assistance is not needed.  I offered the [place] and pt wanted to apply so she provided me with her proof of income.  She is approved for the $400 [employer].  I went over what it covers and gave her an expense sheet.  She has my card for any questions or concerns she may have in the future.

## 2017-02-22 NOTE — Progress Notes (Signed)
Dr. Lebron Conners aware of platelet count. Advised ok to proceed with treatment as ordered.

## 2017-02-22 NOTE — Patient Instructions (Signed)
[facility] Discharge Instructions for Patients Receiving Chemotherapy  Today you received the following chemotherapy agents:  Rituxan and Bendeka.  To help prevent nausea and vomiting after your treatment, we encourage you to take your nausea medication as directed.   If you develop nausea and vomiting that is not controlled by your nausea medication, call the clinic.   BELOW ARE SYMPTOMS THAT SHOULD BE REPORTED IMMEDIATELY:  *FEVER GREATER THAN 100.5 F  *CHILLS WITH OR WITHOUT FEVER  NAUSEA AND VOMITING THAT IS NOT CONTROLLED WITH YOUR NAUSEA MEDICATION  *UNUSUAL SHORTNESS OF BREATH  *UNUSUAL BRUISING OR BLEEDING  TENDERNESS IN MOUTH AND THROAT WITH OR WITHOUT PRESENCE OF ULCERS  *URINARY PROBLEMS  *BOWEL PROBLEMS  UNUSUAL RASH Items with * indicate a potential emergency and should be followed up as soon as possible.  Feel free to call the clinic you have any questions or concerns. The clinic phone number is [phone].  Please show the [place] at check-in to the Emergency Department and triage nurse.  Rituximab injection What is this medicine? RITUXIMAB (ri TUX i mab) is a monoclonal antibody. It is used to treat certain types of cancer like non-Hodgkin lymphoma and chronic lymphocytic leukemia. It is also used to treat rheumatoid arthritis, granulomatosis with polyangiitis (or Wegener's granulomatosis), and microscopic polyangiitis. This medicine may be used for other purposes; ask your health care provider or pharmacist if you have questions. COMMON BRAND NAME(S): Rituxan What should I tell my health care provider before I take this medicine? They need to know if you have any of these conditions: -heart disease -infection (especially a virus infection such as hepatitis B, chickenpox, cold sores, or herpes) -immune system problems -irregular heartbeat -kidney disease -lung or breathing disease, like asthma -recently received or scheduled  to receive a vaccine -an unusual or allergic reaction to rituximab, mouse proteins, other medicines, foods, dyes, or preservatives -pregnant or trying to get pregnant -breast-feeding How should I use this medicine? This medicine is for infusion into a vein. It is administered in a hospital or clinic by a specially trained health care professional. A special MedGuide will be given to you by the pharmacist with each prescription and refill. Be sure to read this information carefully each time. Talk to your pediatrician regarding the use of this medicine in children. This medicine is not approved for use in children. Overdosage: If you think you have taken too much of this medicine contact a poison control center or emergency room at once. NOTE: This medicine is only for you. Do not share this medicine with others. What if I miss a dose? It is important not to miss a dose. Call your doctor or health care professional if you are unable to keep an appointment. What may interact with this medicine? -cisplatin -other medicines for arthritis like disease modifying antirheumatic drugs or tumor necrosis factor inhibitors -live virus vaccines This list may not describe all possible interactions. Give your health care provider a list of all the medicines, herbs, non-prescription drugs, or dietary supplements you use. Also tell them if you smoke, drink alcohol, or use illegal drugs. Some items may interact with your medicine. What should I watch for while using this medicine? Your condition will be monitored carefully while you are receiving this medicine. You may need blood work done while you are taking this medicine. This medicine can cause serious allergic reactions. To reduce your risk you may need to take medicine before treatment with this medicine. Take  your medicine as directed. In some patients, this medicine may cause a serious brain infection that may cause death. If you have any problems seeing,  thinking, speaking, walking, or standing, tell your doctor right away. If you cannot reach your doctor, urgently seek other source of medical care. Call your doctor or health care professional for advice if you get a fever, chills or sore throat, or other symptoms of a cold or flu. Do not treat yourself. This drug decreases your body's ability to fight infections. Try to avoid being around people who are sick. Do not become pregnant while taking this medicine or for 12 months after stopping it. Women should inform their doctor if they wish to become pregnant or think they might be pregnant. There is a potential for serious side effects to an unborn child. Talk to your health care professional or pharmacist for more information. What side effects may I notice from receiving this medicine? Side effects that you should report to your doctor or health care professional as soon as possible: -breathing problems -chest pain -dizziness or feeling faint -fast, irregular heartbeat -low blood counts - this medicine may decrease the number of white blood cells, red blood cells and platelets. You may be at increased risk for infections and bleeding. -mouth sores -redness, blistering, peeling or loosening of the skin, including inside the mouth (this can be added for any serious or exfoliative rash that could lead to hospitalization) -signs of infection - fever or chills, cough, sore throat, pain or difficulty passing urine -signs and symptoms of kidney injury like trouble passing urine or change in the amount of urine -signs and symptoms of liver injury like dark yellow or brown urine; general ill feeling or flu-like symptoms; light-colored stools; loss of appetite; nausea; right upper belly pain; unusually weak or tired; yellowing of the eyes or skin -stomach pain -vomiting Side effects that usually do not require medical attention (report to your doctor or health care professional if they continue or are  bothersome): -headache -joint pain -muscle cramps or muscle pain This list may not describe all possible side effects. Call your doctor for medical advice about side effects. You may report side effects to FDA at 1-800-FDA-1088. Where should I keep my medicine? This drug is given in a hospital or clinic and will not be stored at home. NOTE: This sheet is a summary. It may not cover all possible information. If you have questions about this medicine, talk to your doctor, pharmacist, or health care provider.  2018 Elsevier/Gold Standard (2016-02-08 15:28:09)  Bendamustine Injection What is this medicine? BENDAMUSTINE (BEN da MUS teen) is a chemotherapy drug. It is used to treat chronic lymphocytic leukemia and non-Hodgkin lymphoma. This medicine may be used for other purposes; ask your health care provider or pharmacist if you have questions. COMMON BRAND NAME(S): BENDEKA, Treanda What should I tell my health care provider before I take this medicine? They need to know if you have any of these conditions: -infection (especially a virus infection such as chickenpox, cold sores, or herpes) -kidney disease -liver disease -an unusual or allergic reaction to bendamustine, mannitol, other medicines, foods, dyes, or preservatives -pregnant or trying to get pregnant -breast-feeding How should I use this medicine? This medicine is for infusion into a vein. It is given by a health care professional in a hospital or clinic setting. Talk to your pediatrician regarding the use of this medicine in children. Special care may be needed. Overdosage: If you think you have  taken too much of this medicine contact a poison control center or emergency room at once. NOTE: This medicine is only for you. Do not share this medicine with others. What if I miss a dose? It is important not to miss your dose. Call your doctor or health care professional if you are unable to keep an appointment. What may interact with  this medicine? Do not take this medicine with any of the following medications: -clozapine This medicine may also interact with the following medications: -atazanavir -cimetidine -ciprofloxacin -enoxacin -fluvoxamine -medicines for seizures like carbamazepine and phenobarbital -mexiletine -rifampin -tacrine -thiabendazole -zileuton This list may not describe all possible interactions. Give your health care provider a list of all the medicines, herbs, non-prescription drugs, or dietary supplements you use. Also tell them if you smoke, drink alcohol, or use illegal drugs. Some items may interact with your medicine. What should I watch for while using this medicine? This drug may make you feel generally unwell. This is not uncommon, as chemotherapy can affect healthy cells as well as cancer cells. Report any side effects. Continue your course of treatment even though you feel ill unless your doctor tells you to stop. You may need blood work done while you are taking this medicine. Call your doctor or health care professional for advice if you get a fever, chills or sore throat, or other symptoms of a cold or flu. Do not treat yourself. This drug decreases your body's ability to fight infections. Try to avoid being around people who are sick. This medicine may increase your risk to bruise or bleed. Call your doctor or health care professional if you notice any unusual bleeding. Talk to your doctor about your risk of cancer. You may be more at risk for certain types of cancers if you take this medicine. Do not become pregnant while taking this medicine or for 3 months after stopping it. Women should inform their doctor if they wish to become pregnant or think they might be pregnant. Men should not father a child while taking this medicine and for 3 months after stopping it.There is a potential for serious side effects to an unborn child. Talk to your health care professional or pharmacist for more  information. Do not breast-feed an infant while taking this medicine. This medicine may interfere with the ability to have a child. You should talk with your doctor or health care professional if you are concerned about your fertility. What side effects may I notice from receiving this medicine? Side effects that you should report to your doctor or health care professional as soon as possible: -allergic reactions like skin rash, itching or hives, swelling of the face, lips, or tongue -low blood counts - this medicine may decrease the number of white blood cells, red blood cells and platelets. You may be at increased risk for infections and bleeding. -redness, blistering, peeling or loosening of the skin, including inside the mouth -signs of infection - fever or chills, cough, sore throat, pain or difficulty passing urine -signs of decreased platelets or bleeding - bruising, pinpoint red spots on the skin, black, tarry stools, blood in the urine -signs of decreased red blood cells - unusually weak or tired, fainting spells, lightheadedness -signs and symptoms of kidney injury like trouble passing urine or change in the amount of urine -signs and symptoms of liver injury like dark yellow or brown urine; general ill feeling or flu-like symptoms; light-colored stools; loss of appetite; nausea; right upper belly pain; unusually weak  or tired; yellowing of the eyes or skin Side effects that usually do not require medical attention (report to your doctor or health care professional if they continue or are bothersome): -constipation -decreased appetite -diarrhea -headache -mouth sores -nausea/vomiting -tiredness This list may not describe all possible side effects. Call your doctor for medical advice about side effects. You may report side effects to FDA at 1-800-FDA-1088. Where should I keep my medicine? This drug is given in a hospital or clinic and will not be stored at home. NOTE: This sheet is a  summary. It may not cover all possible information. If you have questions about this medicine, talk to your doctor, pharmacist, or health care provider.  2018 Elsevier/Gold Standard (2015-05-05 08:45:41)

## 2017-02-22 NOTE — Assessment & Plan Note (Signed)
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 Lymphoma was diagnosed in Dec 2015 and treated with dose-adjusted R-EPOCH with the patient receiving full 6 cycles of chemotherapy with 4 cycles of prophylactic intrathecal treatment. Treatment was conducted by [facility] system with complete response documented by imaging.  Now we have a pathologically-confirmed recurrence of the malignancy with appearance of the tumor cells as well as their immunohistochemical profile consistent with diffuse large B-cell lymphoma. Lymphoma does not appear to involve bone marrow at this time. Typically, salvage recurrence therapy involves chemotherapy followed by possible consolidation with high-dose chemotherapy and autologous stem cell transplant. Based on the patient's age, comorbidities, and current performance status, I do not believe her to be a candidate for treatment with R-ICE or R-GemOx combinations based on the above. Additional options available at this time include combination of rituximab plus bendamustine, ibrutinib, lenalidomide/dexamethasone with rituximab and rituximab alone.  Previously, extensive discussion regarding the choice of systemic therapy was conducted with the patient and her family. After reviewing options, there are toxicities and potential benefits, we have decided to proceed with combination of rituximab and bendamustine. Our clinic visit today, I have reviewed the proposed therapy, its alternatives, component medications, schedule administration, and potential toxicities with the patient and her daughter. And agree to proceed with treatment as recommended.  Clinical evaluation and lab work up or Mrs. to proceed with therapy today.   Plan: --Proceed with C1d1 R-Bendamustine therapy --RTC 1wk with labs for toxicity monitoring  Voice recognition software was used and creation of  this note. Despite my best effort at editing the text, some misspelling/errors may have occurred.

## 2017-02-22 NOTE — Telephone Encounter (Signed)
Scheduled appt per 8/10 los - Gave patient AVS and calender per los.  

## 2017-02-22 NOTE — Patient Instructions (Signed)

## 2017-02-22 NOTE — Telephone Encounter (Signed)
Tried to call pt no answer and no vm.

## 2017-02-23 LAB — PHOSPHORUS: Phosphorus, Ser: 2.7 mg/dL (ref 2.5–4.5)

## 2017-02-25 ENCOUNTER — Telehealth: Payer: Self-pay | Admitting: *Deleted

## 2017-02-25 ENCOUNTER — Ambulatory Visit (HOSPITAL_BASED_OUTPATIENT_CLINIC_OR_DEPARTMENT_OTHER): Payer: Medicare HMO

## 2017-02-25 VITALS — BP 103/53 | HR 98 | Temp 98.9°F | Resp 18

## 2017-02-25 DIAGNOSIS — C8333 Diffuse large B-cell lymphoma, intra-abdominal lymph nodes: Secondary | ICD-10-CM

## 2017-02-25 DIAGNOSIS — Z5111 Encounter for antineoplastic chemotherapy: Secondary | ICD-10-CM | POA: Diagnosis not present

## 2017-02-25 MED ORDER — ONDANSETRON HCL 4 MG/2ML IJ SOLN
8.0000 mg | Freq: Once | INTRAMUSCULAR | Status: AC
  Administered 2017-02-25: 8 mg via INTRAVENOUS

## 2017-02-25 MED ORDER — HEPARIN SOD (PORK) LOCK FLUSH 100 UNIT/ML IV SOLN
500.0000 [IU] | Freq: Once | INTRAVENOUS | Status: AC | PRN
  Administered 2017-02-25: 500 [IU]
  Filled 2017-02-25: qty 5

## 2017-02-25 MED ORDER — DEXAMETHASONE SODIUM PHOSPHATE 10 MG/ML IJ SOLN
10.0000 mg | Freq: Once | INTRAMUSCULAR | Status: AC
  Administered 2017-02-25: 10 mg via INTRAVENOUS

## 2017-02-25 MED ORDER — SODIUM CHLORIDE 0.9 % IV SOLN
Freq: Once | INTRAVENOUS | Status: AC
  Administered 2017-02-25: 14:00:00 via INTRAVENOUS

## 2017-02-25 MED ORDER — SODIUM CHLORIDE 0.9% FLUSH
10.0000 mL | INTRAVENOUS | Status: DC | PRN
  Administered 2017-02-25: 10 mL
  Filled 2017-02-25: qty 10

## 2017-02-25 MED ORDER — DEXAMETHASONE SODIUM PHOSPHATE 10 MG/ML IJ SOLN
INTRAMUSCULAR | Status: AC
  Filled 2017-02-25: qty 1

## 2017-02-25 MED ORDER — SODIUM CHLORIDE 0.9 % IV SOLN
90.0000 mg/m2 | Freq: Once | INTRAVENOUS | Status: AC
  Administered 2017-02-25: 200 mg via INTRAVENOUS
  Filled 2017-02-25: qty 8

## 2017-02-25 MED ORDER — ONDANSETRON HCL 4 MG/2ML IJ SOLN
INTRAMUSCULAR | Status: AC
  Filled 2017-02-25: qty 4

## 2017-02-25 NOTE — Telephone Encounter (Signed)
-----   Message from Cyndia Bent, RN sent at 02/22/2017 10:58 AM EDT ----- Regarding: Lebron Conners 1st chemo follow up  1st chemo follow up call

## 2017-02-25 NOTE — Patient Instructions (Signed)
[facility] Discharge Instructions for Patients Receiving Chemotherapy  Today you received the following chemotherapy agents Bendeka.   To help prevent nausea and vomiting after your treatment, we encourage you to take your nausea medication as directed.    If you develop nausea and vomiting that is not controlled by your nausea medication, call the clinic.   BELOW ARE SYMPTOMS THAT SHOULD BE REPORTED IMMEDIATELY:  *FEVER GREATER THAN 100.5 F  *CHILLS WITH OR WITHOUT FEVER  NAUSEA AND VOMITING THAT IS NOT CONTROLLED WITH YOUR NAUSEA MEDICATION  *UNUSUAL SHORTNESS OF BREATH  *UNUSUAL BRUISING OR BLEEDING  TENDERNESS IN MOUTH AND THROAT WITH OR WITHOUT PRESENCE OF ULCERS  *URINARY PROBLEMS  *BOWEL PROBLEMS  UNUSUAL RASH Items with * indicate a potential emergency and should be followed up as soon as possible.  Feel free to call the clinic you have any questions or concerns. The clinic phone number is [phone].  Please show the CHEMO ALERT CARD at check-in to the Emergency Department and triage nurse.   

## 2017-02-25 NOTE — Telephone Encounter (Signed)
Attempted call to pt for chemo f/u.  No answer.  No VM available.

## 2017-02-26 DIAGNOSIS — M5136 Other intervertebral disc degeneration, lumbar region: Secondary | ICD-10-CM | POA: Diagnosis not present

## 2017-02-26 DIAGNOSIS — I129 Hypertensive chronic kidney disease with stage 1 through stage 4 chronic kidney disease, or unspecified chronic kidney disease: Secondary | ICD-10-CM | POA: Diagnosis not present

## 2017-02-26 DIAGNOSIS — M48061 Spinal stenosis, lumbar region without neurogenic claudication: Secondary | ICD-10-CM | POA: Diagnosis not present

## 2017-02-26 DIAGNOSIS — Z940 Kidney transplant status: Secondary | ICD-10-CM | POA: Diagnosis not present

## 2017-02-26 DIAGNOSIS — W19XXXD Unspecified fall, subsequent encounter: Secondary | ICD-10-CM | POA: Diagnosis not present

## 2017-02-26 DIAGNOSIS — G8929 Other chronic pain: Secondary | ICD-10-CM | POA: Diagnosis not present

## 2017-02-26 DIAGNOSIS — N183 Chronic kidney disease, stage 3 (moderate): Secondary | ICD-10-CM | POA: Diagnosis not present

## 2017-02-26 DIAGNOSIS — D696 Thrombocytopenia, unspecified: Secondary | ICD-10-CM | POA: Diagnosis not present

## 2017-02-26 DIAGNOSIS — C8330 Diffuse large B-cell lymphoma, unspecified site: Secondary | ICD-10-CM | POA: Diagnosis not present

## 2017-02-28 ENCOUNTER — Encounter: Payer: Self-pay | Admitting: Family Medicine

## 2017-02-28 DIAGNOSIS — I129 Hypertensive chronic kidney disease with stage 1 through stage 4 chronic kidney disease, or unspecified chronic kidney disease: Secondary | ICD-10-CM | POA: Diagnosis not present

## 2017-02-28 DIAGNOSIS — M5136 Other intervertebral disc degeneration, lumbar region: Secondary | ICD-10-CM | POA: Diagnosis not present

## 2017-02-28 DIAGNOSIS — G8929 Other chronic pain: Secondary | ICD-10-CM | POA: Diagnosis not present

## 2017-02-28 DIAGNOSIS — M48061 Spinal stenosis, lumbar region without neurogenic claudication: Secondary | ICD-10-CM | POA: Diagnosis not present

## 2017-02-28 DIAGNOSIS — C8330 Diffuse large B-cell lymphoma, unspecified site: Secondary | ICD-10-CM | POA: Diagnosis not present

## 2017-02-28 DIAGNOSIS — D696 Thrombocytopenia, unspecified: Secondary | ICD-10-CM | POA: Diagnosis not present

## 2017-02-28 DIAGNOSIS — Z940 Kidney transplant status: Secondary | ICD-10-CM | POA: Diagnosis not present

## 2017-02-28 DIAGNOSIS — N183 Chronic kidney disease, stage 3 (moderate): Secondary | ICD-10-CM | POA: Diagnosis not present

## 2017-02-28 DIAGNOSIS — W19XXXD Unspecified fall, subsequent encounter: Secondary | ICD-10-CM | POA: Diagnosis not present

## 2017-03-01 ENCOUNTER — Other Ambulatory Visit (HOSPITAL_BASED_OUTPATIENT_CLINIC_OR_DEPARTMENT_OTHER): Payer: Medicare HMO

## 2017-03-01 ENCOUNTER — Encounter: Payer: Self-pay | Admitting: Hematology and Oncology

## 2017-03-01 ENCOUNTER — Telehealth: Payer: Self-pay

## 2017-03-01 ENCOUNTER — Telehealth: Payer: Self-pay | Admitting: Hematology and Oncology

## 2017-03-01 ENCOUNTER — Ambulatory Visit (HOSPITAL_BASED_OUTPATIENT_CLINIC_OR_DEPARTMENT_OTHER): Payer: Medicare HMO | Admitting: Hematology and Oncology

## 2017-03-01 VITALS — BP 120/65 | HR 87 | Temp 97.8°F | Resp 17 | Ht 64.0 in | Wt 217.3 lb

## 2017-03-01 DIAGNOSIS — C8333 Diffuse large B-cell lymphoma, intra-abdominal lymph nodes: Secondary | ICD-10-CM | POA: Diagnosis not present

## 2017-03-01 DIAGNOSIS — M545 Low back pain, unspecified: Secondary | ICD-10-CM

## 2017-03-01 LAB — CBC WITH DIFFERENTIAL/PLATELET
BASO%: 0 % (ref 0.0–2.0)
BASOS ABS: 0 10*3/uL (ref 0.0–0.1)
EOS%: 1.7 % (ref 0.0–7.0)
Eosinophils Absolute: 0.1 10*3/uL (ref 0.0–0.5)
HCT: 36.1 % (ref 34.8–46.6)
HGB: 11.6 g/dL (ref 11.6–15.9)
LYMPH%: 7.3 % — AB (ref 14.0–49.7)
MCH: 31.2 pg (ref 25.1–34.0)
MCHC: 32.1 g/dL (ref 31.5–36.0)
MCV: 97 fL (ref 79.5–101.0)
MONO#: 0.3 10*3/uL (ref 0.1–0.9)
MONO%: 7.5 % (ref 0.0–14.0)
NEUT#: 3.5 10*3/uL (ref 1.5–6.5)
NEUT%: 83.5 % — ABNORMAL HIGH (ref 38.4–76.8)
PLATELETS: 107 10*3/uL — AB (ref 145–400)
RBC: 3.72 10*6/uL (ref 3.70–5.45)
RDW: 16.8 % — ABNORMAL HIGH (ref 11.2–14.5)
WBC: 4.1 10*3/uL (ref 3.9–10.3)
lymph#: 0.3 10*3/uL — ABNORMAL LOW (ref 0.9–3.3)

## 2017-03-01 LAB — COMPREHENSIVE METABOLIC PANEL
ALT: 12 U/L (ref 0–55)
ANION GAP: 9 meq/L (ref 3–11)
AST: 18 U/L (ref 5–34)
Albumin: 3.2 g/dL — ABNORMAL LOW (ref 3.5–5.0)
Alkaline Phosphatase: 94 U/L (ref 40–150)
BUN: 21.4 mg/dL (ref 7.0–26.0)
CHLORIDE: 106 meq/L (ref 98–109)
CO2: 29 meq/L (ref 22–29)
CREATININE: 1.1 mg/dL (ref 0.6–1.1)
Calcium: 8.6 mg/dL (ref 8.4–10.4)
EGFR: 52 mL/min/{1.73_m2} — ABNORMAL LOW (ref >90)
Glucose: 96 mg/dl (ref 70–140)
POTASSIUM: 3.9 meq/L (ref 3.5–5.1)
Sodium: 144 mEq/L (ref 136–145)
Total Bilirubin: 0.67 mg/dL (ref 0.20–1.20)
Total Protein: 5.5 g/dL — ABNORMAL LOW (ref 6.4–8.3)

## 2017-03-01 LAB — URIC ACID: Uric Acid, Serum: 9.8 mg/dl — ABNORMAL HIGH (ref 2.6–7.4)

## 2017-03-01 LAB — MAGNESIUM: Magnesium: 1.4 mg/dl — CL (ref 1.5–2.5)

## 2017-03-01 MED ORDER — MORPHINE SULFATE 15 MG PO TABS: 15.0000 mg | ORAL_TABLET | ORAL | 0 refills | Status: DC | PRN

## 2017-03-01 MED ORDER — MAGNESIUM OXIDE 400 (241.3 MG) MG PO TABS: 400.0000 mg | ORAL_TABLET | Freq: Every day | ORAL | 0 refills | Status: DC

## 2017-03-01 NOTE — Telephone Encounter (Signed)
Rowe Clack from [facility] called and requested a verbal for occupational therapy 2 times a week for Erica Woodward. This week they will  only do once , but then after twice weekly.  Spoke with PCP he verbally agreed

## 2017-03-01 NOTE — Progress Notes (Signed)
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 Lymphoma was diagnosed in Dec 2015 and treated with dose-adjusted R-EPOCH with the patient receiving full 6 cycles of chemotherapy with 4 cycles of prophylactic intrathecal treatment. Treatment was conducted by [facility] system with complete response documented by imaging. Earlier this year, patient has developed symptomatic, biopsy-confirmed recurrence of the malignancy consistent with diffuse large B-cell lymphoma.  After extensive discussion of possible options, patient initiated palliative systemic chemoimmunotherapy with rituximab plus bendamustine receiving the initial dose of chemotherapy last week. Appears to have tolerated infusion well. No apparent side effects from the treatment observed so far.  Plan: --Mg Oxide 4104m POQDay for hypomagnesemia --RTC 3wk with labs, clinic visit, possible second cycle of systemic chemoimmunotherapy.    Voice recognition software was used and creation of this note. Despite my best effort at editing the text, some misspelling/errors may have occurred.   Orders Placed This Encounter  Procedures  . CBC with Differential    Standing Status:   Future    Standing Expiration Date:   03/01/2018  . Comprehensive metabolic panel    Standing Status:   Future    Standing Expiration Date:   03/01/2018  . Lactate dehydrogenase (LDH)    Standing Status:   Future    Standing Expiration Date:   03/01/2018  . Uric acid    Standing Status:   Future    Standing Expiration Date:   03/01/2018  . Magnesium    Standing Status:   Future    Standing Expiration Date:   03/01/2018  . Phosphorus    Standing Status:   Future    Standing Expiration Date:   03/01/2018    Cancer Staging No matching staging information was  found for the patient.  All questions were answered.  . The patient knows to call the clinic with any problems, questions or concerns.  This note was electronically signed.    History of Presenting Illness [id number] [facility] [place] [facility]. At the last visit to the clinic, patient was started on systemic chemotherapy with rituximab and bendamustine. She appears to have tolerated the infusion without complications. Reports stable level of back pain, no worsening of abdominal discomfort, and maybe some improvement. No interval fever, chills, night sweats.  Oncological/hematological History:   NHL ([place]Hodgkin's [place]   06/25/2014 Initial Diagnosis    Diffuse large B-cell lymphoma (followed and treated by RHessie Dibble MD & SLovenia [facility]espina [facility]; As per the last note: "She [patient] presented to an outside hospital in December 2015 with worsening nausea, vomiting, and left lower quadrant abdominal pain. CT abdomen/ pelvis was concerning for peritoneal carcinomatosis. [facility] A CT abdomen/ pelvis with contrast on 06/17/14 showed diffuse peritoneal metastatic disease, ascites, bilateral pleural effusions, and focal thickening of a loop of small bowel.   A paracentesis performed on 12/7 came back showing a high-grade malignant neoplasm favoring lymphoma. She underwent an open exploratory laparotomy and biopsy of the thickened omentum on 12/11. Pathology showed aggressive diffuse large B-cell lymphoma with Ki-67 of 100% that was strongly positive for CD20, CD10, and BCL-6 and negative for BCL-2. FISH studies were negative for MYC, BCL6, and IgH/BCL2 translocations.  EBV PCR and HIV were negative. Bone marrow biopsy showed a mild CD20+ interstitial B-cell lymphocytosis. Labs showed an elevated LDH of 326  and anemia (Hgb 10.5). She did not have a PET CT scan at diagnosis as chemotherapy was immediately started as an inpatient. CSF showed atypical lymphocytes favoring benign reactive pleocytosis but flow cytometry was not performed. She reported a 40 lb weight loss as well as progressive night sweats over 3 months. Thus, she had stage IVB disease at presentation with IPI 5.      07/02/2014 - 11/01/2014 Chemotherapy    Dose Adjusted R-EPOCH: --Cycle #1, 07/02/14: Dose Level 1 --Cycle #2, 07/26/14: Dose Level 2 --Cycle #3, 08/16/14: Dose Level 2 --Cycle #4, 09/06/14: Dose Level 2 --Cycle #5, 09/27/14: Dose Level 2 --Cycle #6, 11/01/14: Dose Level 1 -- Delayed by 1 week due to poor tolerance to treatment and decline in her performance status. Dose level reduced to 1.  Completed IT chemotherapy x 4 doses.          09/20/2014 Imaging    External PET-CT (after 4 cycles of systemic therapy): No evidence of FDG avid recurrent or metastatic disease. Interval resolution of diffuse mesenteric edema and abdominopelvic ascites. Interval resolution of small bilateral pleural effusions. Diffuse metabolic activity in the bone marrow consistent with chemotherapy treatment. Right adnexal cyst measuring 3.3 cm. Recommend annual ultrasound surveillance for this lesion.        12/29/2016 Relapse/Recurrence    Patient presented with recurrent abdominal pain, which led to CT of the abdomen with contrast and pain medications. Imaging revealed recurrent lymphadenopathy in the abdomen strongly suspicious for possible recurrent lymphoma. Presentation was complicated by development of delirium likely due to combination of severe pain, opioid pain medications, acute kidney injury precipitated by hyperuricemia and possibly contrast administration in a dehydrated patient.  Patient required hospitalization, stabilized and improved significantly. Nevertheless, continues to have multiple problems not the least of which is  persistent peripheral swelling, worsening performance status, dyspnea on exertion.      01/25/2017 Imaging    PET-CT: Intensely hypermetabolic retroperitoneal, pelvic, sigmoid mesocolon, and perirectal adenopathy compatible with malignancy. There is also a small but hypermetabolic paraesophageal lymph node in the thorax. Appearance compatible with active lymphoma.      01/25/2017 Imaging    MRI L-spine: Degenerative lumbar spondylosis with multilevel disc disease and facet disease. The most significant findings are at L4-5 where there is severe multifactorial spinal and bilateral lateral recess stenosis and significant right foraminal stenosis.      02/01/2017 Pathology Results    LN Core Bx: High-grade large cell lymphoma; IHC -- positive for LCA, CD79.9, CD20, CD10, BCL2, BCL6 & negative for CD30, CD34, CD138, CD3; BM Bx: No evidence of involvement by a monoclonal lymphoproliferative process      02/22/2017 -  Chemotherapy    Rituximab 358m/m2, d1 + Bendamustine 956mm2, d1,2 Q28 D --Cycle #1, 02/22/17:        Medical History: Past Medical History:  Diagnosis Date  . Basal cell carcinoma   [facility]   [facility]  . Chronic headaches   . DDD (degenerative disc disease), lumbar    L4-5 right foraminal and spinal stenosis causng lumbar radiculopathy  . Diverticulosis   . GERD (gastroesophageal reflux disease)   . HLD (hyperlipidemia)   . Hx of adenomatous polyp of colon 05/21/2016  . Hypertension   . Renal disorder    kidney transplant 2005  . Status post dilation of esophageal narrowing  Surgical History: Past Surgical History:  Procedure Laterality Date  . COLONOSCOPY    . IR FLUORO GUIDE PORT INSERTION LEFT  02/18/2017  . IR US GUIDE VASC ACCESS LEFT  02/18/2017  . KIDNEY TRANSPLANT  07/26/2013  . LUMBAR PUNCTURE W/ INTRATHECAL CHEMOTHERAPY  08/19/14  . PORTACATH PLACEMENT    . UPPER GASTROINTESTINAL ENDOSCOPY      Family History: Family History  Problem  Relation Age of Onset  . Kidney disease Father   . Diabetes Father   . Lung cancer Brother   . Seizures Brother   . Seizures Sister   . Breast cancer Daughter   . Heart disease Brother     Social History: Social History   Social History  . Marital status: Married    Spouse name: N/A  . Number of children: 4  . Years of education: N/A   Occupational History  . retired    Social History Main Topics  . Smoking status: Never Smoker  . Smokeless tobacco: Never Used  . Alcohol use No  . Drug use: No  . Sexual activity: Not on file   Other Topics Concern  . Not on file   Social History Narrative  . No narrative on file    Allergies: No Known Allergies  Medications:  Current Outpatient Prescriptions  Medication Sig Dispense Refill  . acyclovir (ZOVIRAX) 400 MG tablet Take 1 tablet (400 mg total) by mouth daily. 30 tablet 3  . allopurinol (ZYLOPRIM) 300 MG tablet Take 1 tablet (300 mg total) by mouth daily. 30 tablet 3  . aspirin EC 81 MG tablet Take 81 mg by mouth daily.    Marland Kitchen atorvastatin (LIPITOR) 40 MG tablet TAKE 1 TABLET (40 MG TOTAL) BY MOUTH DAILY. 90 tablet 1  . cetirizine (ZYRTEC) 10 MG tablet Take 10 mg by mouth at bedtime.    Marland Kitchen dexamethasone (DECADRON) 4 MG tablet Take 2 tablets (8 mg total) by mouth daily. Start the day after bendamustine chemotherapy for 2 days. Take with food. 30 tablet 1  . esomeprazole (NEXIUM) 40 MG capsule Take 1 capsule (40 mg total) by mouth daily. 90 capsule 3  . gabapentin (NEURONTIN) 300 MG capsule Take 1 capsule (300 mg total) by mouth 3 (three) times daily. (Patient taking differently: Take 900 mg by mouth 2 (two) times daily. ) 270 capsule 3  . lidocaine-prilocaine (EMLA) cream Apply to affected area once 30 g 3  . LORazepam (ATIVAN) 0.5 MG tablet Take 1 tablet (0.5 mg total) by mouth every 6 (six) hours as needed (Nausea and vomiting). 30 tablet 0  . LORazepam (ATIVAN) 0.5 MG tablet Take 1 tablet (0.5 mg total) by mouth every 6  (six) hours as needed (Nausea or vomiting). 30 tablet 0  . magnesium oxide (MAG-OX) 400 (241.3 Mg) MG tablet Take 1 tablet (400 mg total) by mouth daily. 21 tablet 0  . magnesium oxide (MAG-OX) 400 MG tablet Take 400 mg by mouth 2 (two) times daily.     Marland Kitchen morphine (MS CONTIN) 15 MG 12 hr tablet Take 1 tablet (15 mg total) by mouth every 12 (twelve) hours. 60 tablet 0  . morphine (MSIR) 15 MG tablet Take 1 tablet (15 mg total) by mouth every 2 (two) hours as needed for severe pain (cancer-associated pain). 50 tablet 0  . ondansetron (ZOFRAN) 8 MG tablet Take 1 tablet (8 mg total) by mouth 2 (two) times daily as needed for refractory nausea / vomiting. Start on day 2 after bendamustine  chemotherapy. 30 tablet 1  . predniSONE (DELTASONE) 10 MG tablet Take 10 mg by mouth daily with breakfast.    . predniSONE (DELTASONE) 20 MG tablet 3 tabs poqday 1-2, 2 tabs poqday 3-4, 1 tab poqday 5-6 (Patient not taking: Reported on 02/04/2017) 12 tablet 0  . prochlorperazine (COMPAZINE) 10 MG tablet Take 1 tablet (10 mg total) by mouth every 6 (six) hours as needed for nausea or vomiting. 30 tablet 0  . prochlorperazine (COMPAZINE) 10 MG tablet Take 1 tablet (10 mg total) by mouth every 6 (six) hours as needed (Nausea or vomiting). 30 tablet 1  . tacrolimus (PROGRAF) 0.5 MG capsule Take 1.5 mg by mouth 2 (two) times daily. Take 3 capsules (1.5 mg) BID     No current facility-administered medications for this visit.     Review of Systems: Review of Systems  All other systems reviewed and are negative.    PHYSICAL EXAMINATION Blood pressure 120/65, pulse 87, temperature 97.8 F (36.6 C), temperature source Oral, resp. rate 17, height '5\' 4"'  (1.626 m), weight 217 lb 4.8 oz (98.6 kg), SpO2 97 %.  ECOG PERFORMANCE STATUS: 2 - Symptomatic, <50% confined to bed  Physical Exam  Constitutional: She is oriented to person, place, and time.  Patient appears fatigued.  HENT:  Head: Normocephalic and atraumatic.   Mouth/Throat: Oropharynx is clear and moist. No oropharyngeal exudate.  No oral thrush. No mucosal ulcers  Eyes: Pupils are equal, round, and reactive to light. Conjunctivae are normal. No scleral icterus.  Neck: No tracheal deviation present. No thyromegaly present.  Cardiovascular: Normal rate and regular rhythm.   No murmur heard. Distant heart sounds  Pulmonary/Chest: Effort normal. No stridor. She has no wheezes.  Upper lungs are clear to auscultation. Lower lungs with bilateral dependent crackles and possible bones to percussion at the bases of the lungs.  Abdominal: Soft. She exhibits no distension and no mass. There is tenderness. There is no rebound and no guarding.  If no palpable tenderness in the left upper quadrant of the abdomen without rebound or peritoneal irritation. No palpable mass.  Musculoskeletal:  Tenderness to palpation over the lower lumbar spine and sacrum. No radiation down in the extremities.  Lymphadenopathy:       Head (right side): No submandibular and no occipital adenopathy present.       Head (left side): No submandibular and no occipital adenopathy present.    She has no cervical adenopathy.    She has no axillary adenopathy.       Right: No inguinal and no supraclavicular adenopathy present.       Left: No inguinal and no supraclavicular adenopathy present.  Neurological: She is oriented to person, place, and time. She has normal sensation, normal reflexes and intact cranial nerves.  Skin: She is not diaphoretic.     LABORATORY DATA: I have personally reviewed the data as listed: Appointment on 03/01/2017  Component Date Value Ref Range Status  . WBC 03/01/2017 4.1  3.9 - 10.3 10e3/uL Final  . NEUT# 03/01/2017 3.5  1.5 - 6.5 10e3/uL Final  . HGB 03/01/2017 11.6  11.6 - 15.9 g/dL Final  . HCT 03/01/2017 36.1  34.8 - 46.6 % Final  . Platelets 03/01/2017 107* 145 - 400 10e3/uL Final  . MCV 03/01/2017 97.0  79.5 - 101.0 fL Final  . MCH 03/01/2017  31.2  25.1 - 34.0 pg Final  . MCHC 03/01/2017 32.1  31.5 - 36.0 g/dL Final  . RBC 03/01/2017 3.72  3.70 - 5.45 10e6/uL Final  . RDW 03/01/2017 16.8* 11.2 - 14.5 % Final  . lymph# 03/01/2017 0.3* 0.9 - 3.3 10e3/uL Final  . MONO# 03/01/2017 0.3  0.1 - 0.9 10e3/uL Final  . Eosinophils Absolute 03/01/2017 0.1  0.0 - 0.5 10e3/uL Final  . Basophils Absolute 03/01/2017 0.0  0.0 - 0.1 10e3/uL Final  . NEUT% 03/01/2017 83.5* 38.4 - 76.8 % Final  . LYMPH% 03/01/2017 7.3* 14.0 - 49.7 % Final  . MONO% 03/01/2017 7.5  0.0 - 14.0 % Final  . EOS% 03/01/2017 1.7  0.0 - 7.0 % Final  . BASO% 03/01/2017 0.0  0.0 - 2.0 % Final  . Sodium 03/01/2017 144  136 - 145 mEq/L Final  . Potassium 03/01/2017 3.9  3.5 - 5.1 mEq/L Final  . Chloride 03/01/2017 106  98 - 109 mEq/L Final  . CO2 03/01/2017 29  22 - 29 mEq/L Final  . Glucose 03/01/2017 96  70 - 140 mg/dl Final   Glucose reference range is for nonfasting patients. Fasting glucose reference range is 70- 100.  Marland Kitchen BUN 03/01/2017 21.4  7.0 - 26.0 mg/dL Final  . Creatinine 03/01/2017 1.1  0.6 - 1.1 mg/dL Final  . Total Bilirubin 03/01/2017 0.67  0.20 - 1.20 mg/dL Final  . Alkaline Phosphatase 03/01/2017 94  40 - 150 U/L Final  . AST 03/01/2017 18  5 - 34 U/L Final  . ALT 03/01/2017 12  0 - 55 U/L Final  . Total Protein 03/01/2017 5.5* 6.4 - 8.3 g/dL Final  . Albumin 03/01/2017 3.2* 3.5 - 5.0 g/dL Final  . Calcium 03/01/2017 8.6  8.4 - 10.4 mg/dL Final  . Anion Gap 03/01/2017 9  3 - 11 mEq/L Final  . EGFR 03/01/2017 52* >90 ml/min/1.73 m2 Final   eGFR is calculated using the CKD-EPI Creatinine Equation (2009)  . Uric Acid, Serum 03/01/2017 9.8* 2.6 - 7.4 mg/dl Final  . Magnesium 03/01/2017 1.4* 1.5 - 2.5 mg/dl Final       Ardath Sax, MD

## 2017-03-01 NOTE — Telephone Encounter (Signed)
Scheduled appt per 8/17 los - Gave patient AVS and calender per los  

## 2017-03-01 NOTE — Assessment & Plan Note (Signed)
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 Lymphoma was diagnosed in Dec 2015 and treated with dose-adjusted R-EPOCH with the patient receiving full 6 cycles of chemotherapy with 4 cycles of prophylactic intrathecal treatment. Treatment was conducted by [facility] system with complete response documented by imaging. Earlier this year, patient has developed symptomatic, biopsy-confirmed recurrence of the malignancy consistent with diffuse large B-cell lymphoma.  After extensive discussion of possible options, patient initiated palliative systemic chemoimmunotherapy with rituximab plus bendamustine receiving the initial dose of chemotherapy last week. Appears to have tolerated infusion well. No apparent side effects from the treatment observed so far.  Plan: --Mg Oxide 400mg  POQDay for hypomagnesemia --RTC 3wk with labs, clinic visit, possible second cycle of systemic chemoimmunotherapy.

## 2017-03-01 NOTE — Progress Notes (Signed)
Pt magnesium 1.4. Dr. Lebron Conners entered order for oral magnesium replacement. Pt aware to p/u at pharmacy.

## 2017-03-03 ENCOUNTER — Emergency Department (HOSPITAL_COMMUNITY): Payer: Medicare HMO

## 2017-03-03 ENCOUNTER — Encounter (HOSPITAL_COMMUNITY): Payer: Self-pay | Admitting: Emergency Medicine

## 2017-03-03 ENCOUNTER — Emergency Department (HOSPITAL_COMMUNITY)
Admission: EM | Admit: 2017-03-03 | Discharge: 2017-03-03 | Disposition: A | Discharge status: Home / Self Care | Payer: Medicare HMO | Attending: Emergency Medicine | Admitting: Emergency Medicine

## 2017-03-03 DIAGNOSIS — Z79899 Other long term (current) drug therapy: Secondary | ICD-10-CM | POA: Diagnosis not present

## 2017-03-03 DIAGNOSIS — I129 Hypertensive chronic kidney disease with stage 1 through stage 4 chronic kidney disease, or unspecified chronic kidney disease: Secondary | ICD-10-CM | POA: Diagnosis not present

## 2017-03-03 DIAGNOSIS — N390 Urinary tract infection, site not specified: Secondary | ICD-10-CM | POA: Insufficient documentation

## 2017-03-03 DIAGNOSIS — R112 Nausea with vomiting, unspecified: Secondary | ICD-10-CM

## 2017-03-03 DIAGNOSIS — N183 Chronic kidney disease, stage 3 (moderate): Secondary | ICD-10-CM | POA: Insufficient documentation

## 2017-03-03 DIAGNOSIS — R509 Fever, unspecified: Secondary | ICD-10-CM | POA: Diagnosis not present

## 2017-03-03 DIAGNOSIS — Z7982 Long term (current) use of aspirin: Secondary | ICD-10-CM | POA: Diagnosis not present

## 2017-03-03 LAB — COMPREHENSIVE METABOLIC PANEL
ALBUMIN: 3 g/dL — AB (ref 3.5–5.0)
ALK PHOS: 68 U/L (ref 38–126)
ALT: 16 U/L (ref 14–54)
ANION GAP: 9 (ref 5–15)
AST: 20 U/L (ref 15–41)
BILIRUBIN TOTAL: 0.9 mg/dL (ref 0.3–1.2)
BUN: 19 mg/dL (ref 6–20)
CALCIUM: 8.6 mg/dL — AB (ref 8.9–10.3)
CO2: 27 mmol/L (ref 22–32)
Chloride: 103 mmol/L (ref 101–111)
Creatinine, Ser: 1.03 mg/dL — ABNORMAL HIGH (ref 0.44–1.00)
GFR, EST NON AFRICAN AMERICAN: 54 mL/min — AB (ref >60)
Glucose, Bld: 101 mg/dL — ABNORMAL HIGH (ref 65–99)
POTASSIUM: 3.9 mmol/L (ref 3.5–5.1)
Sodium: 139 mmol/L (ref 135–145)
TOTAL PROTEIN: 5.3 g/dL — AB (ref 6.5–8.1)

## 2017-03-03 LAB — CBC WITH DIFFERENTIAL/PLATELET
BASOS ABS: 0 10*3/uL (ref 0.0–0.1)
BASOS PCT: 0 %
EOS ABS: 0 10*3/uL (ref 0.0–0.7)
Eosinophils Relative: 1 %
HEMATOCRIT: 33.5 % — AB (ref 36.0–46.0)
HEMOGLOBIN: 11 g/dL — AB (ref 12.0–15.0)
Lymphocytes Relative: 9 %
Lymphs Abs: 0.3 10*3/uL — ABNORMAL LOW (ref 0.7–4.0)
MCH: 31.8 pg (ref 26.0–34.0)
MCHC: 32.8 g/dL (ref 30.0–36.0)
MCV: 96.8 fL (ref 78.0–100.0)
Monocytes Absolute: 0.6 10*3/uL (ref 0.1–1.0)
Monocytes Relative: 17 %
NEUTROS ABS: 2.4 10*3/uL (ref 1.7–7.7)
NEUTROS PCT: 73 %
Platelets: 137 10*3/uL — ABNORMAL LOW (ref 150–400)
RBC: 3.46 MIL/uL — ABNORMAL LOW (ref 3.87–5.11)
RDW: 17 % — ABNORMAL HIGH (ref 11.5–15.5)
WBC: 3.3 10*3/uL — AB (ref 4.0–10.5)

## 2017-03-03 LAB — URINALYSIS, ROUTINE W REFLEX MICROSCOPIC
BILIRUBIN URINE: NEGATIVE
Bacteria, UA: NONE SEEN
GLUCOSE, UA: NEGATIVE mg/dL
Hgb urine dipstick: NEGATIVE
KETONES UR: NEGATIVE mg/dL
Nitrite: NEGATIVE
PH: 8 (ref 5.0–8.0)
Protein, ur: NEGATIVE mg/dL
SPECIFIC GRAVITY, URINE: 1.011 (ref 1.005–1.030)

## 2017-03-03 LAB — LIPASE, BLOOD: Lipase: 20 U/L (ref 11–51)

## 2017-03-03 LAB — PROTIME-INR
INR: 1.07
PROTHROMBIN TIME: 13.9 s (ref 11.4–15.2)

## 2017-03-03 LAB — I-STAT CG4 LACTIC ACID, ED: LACTIC ACID, VENOUS: 1.35 mmol/L (ref 0.5–1.9)

## 2017-03-03 MED ORDER — CEPHALEXIN 500 MG PO CAPS: 500.0000 mg | ORAL_CAPSULE | Freq: Two times a day (BID) | ORAL | 0 refills | Status: AC

## 2017-03-03 MED ORDER — HEPARIN SOD (PORK) LOCK FLUSH 100 UNIT/ML IV SOLN
INTRAVENOUS | Status: AC
  Filled 2017-03-03: qty 5

## 2017-03-03 MED ORDER — VANCOMYCIN HCL IN DEXTROSE 1-5 GM/200ML-% IV SOLN
1000.0000 mg | Freq: Once | INTRAVENOUS | Status: AC
  Administered 2017-03-03: 1000 mg via INTRAVENOUS
  Filled 2017-03-03: qty 200

## 2017-03-03 MED ORDER — DEXTROSE 5 % IV SOLN: 1.0000 g | Freq: Once | INTRAVENOUS | Status: DC

## 2017-03-03 MED ORDER — SODIUM CHLORIDE 0.9 % IV BOLUS (SEPSIS)
1000.0000 mL | Freq: Once | INTRAVENOUS | Status: AC
Start: 2017-03-03 — End: 2017-03-03
  Administered 2017-03-03: 1000 mL via INTRAVENOUS

## 2017-03-03 MED ORDER — ONDANSETRON HCL 4 MG/2ML IJ SOLN
4.0000 mg | Freq: Once | INTRAMUSCULAR | Status: AC
  Administered 2017-03-03: 4 mg via INTRAVENOUS
  Filled 2017-03-03: qty 2

## 2017-03-03 MED ORDER — PIPERACILLIN-TAZOBACTAM 3.375 G IVPB 30 MIN
3.3750 g | Freq: Once | INTRAVENOUS | Status: AC
  Administered 2017-03-03: 3.375 g via INTRAVENOUS
  Filled 2017-03-03: qty 50

## 2017-03-03 NOTE — ED Notes (Signed)
Second set of cultures sent to the lab.

## 2017-03-03 NOTE — Progress Notes (Signed)
A consult was received from an ED physician for Vancomycin and Zosyn per pharmacy dosing.  The patient's profile has been reviewed for ht/wt/allergies/indication/available labs.   A one time order has been placed for Vanc 1g, Zosyn 3.375 g.  Further antibiotics/pharmacy consults should be ordered by admitting physician if indicated.                       Thank you,  Reuel Boom, PharmD, BCPS Pager: [phone] 03/03/2017, 7:22 PM

## 2017-03-03 NOTE — ED Notes (Signed)
First set of blood cultures sent to lab.

## 2017-03-03 NOTE — ED Triage Notes (Signed)
Pt comes in with complaints of fever and nausea that began this morning. Pt has stage 4 lymphoma.  Receiving active chemotherapy.  Last treatment a week ago. Pt has implanted port.  Pt moaning during triage.  Warm to the touch.

## 2017-03-03 NOTE — ED Notes (Signed)
ED Provider at bedside. 

## 2017-03-03 NOTE — Discharge Instructions (Signed)
Finish the  course of antibiotics. Follow-up with your oncologist later this week to make sure you're improving. Return as needed for worsening symptoms.

## 2017-03-03 NOTE — ED Notes (Signed)
Patient made aware urine sample is needed. 

## 2017-03-03 NOTE — ED Provider Notes (Signed)
[facility] DEPT Provider Note   CSN: [id number] Arrival date & time: 03/03/17  [place]     History   Chief Complaint Chief Complaint  Patient presents with  . Fever  . Nausea    HPI Erica Woodward is a 71 y.o. female.  HPI Pt presents to the ED for evaluation of fever and abdominal pain.  Pt has history of lymphoma, carcinomatosis, and renal transplant.  She is getting chemotherapy treatments. Her last treatment was on Monday. Patient has been having issues with nausea and vomiting as well as abdominal pain since the diagnosis of her cancer. Today however she started having more nausea and vomiting than usual. She has had increasing generalized weakness. At home they measured a temperature up to 101. Pt was told if she developed a fever she needed to come to the ed. Family members brought into the emergency room for evaluation.  No diarrhea, no dysuria Past Medical History:  Diagnosis Date  . Basal cell carcinoma   . Cancer [facility])    carcinomatosis  . Chronic headaches   . DDD (degenerative disc disease), lumbar    L4-5 right foraminal and spinal stenosis causng lumbar radiculopathy  . Diverticulosis   . GERD (gastroesophageal reflux disease)   . HLD (hyperlipidemia)   . Hx of adenomatous polyp of colon 05/21/2016  . Hypertension   . Renal disorder    kidney transplant 2005  . Status post dilation of esophageal narrowing     Patient Active Problem List   Diagnosis Date Noted  . Acute renal failure (ARF) ([place]) 02/08/2017  . Fall at home, initial encounter 02/08/2017  . CKD (chronic kidney disease), stage III 02/08/2017  . Thrombocytopenia ([place]) 02/08/2017  . Shortness of breath 01/08/2017  . Anasarca 01/08/2017  . History of B-cell lymphoma 01/08/2017  . ARF (acute renal failure) ([place]) 12/31/2016  . Hx of adenomatous polyp of colon 05/21/2016  . NHL (non-Hodgkin's lymphoma) ([place]) 08/02/2014  . GERD (gastroesophageal reflux disease) 06/16/2014  . Renal transplant  recipient 07/21/2010  . HLD (hyperlipidemia) 01/17/2010  . GOUT 01/17/2010  . Essential hypertension 01/17/2010  . KNEE INJURY, RIGHT 01/17/2010    Past Surgical History:  Procedure Laterality Date  . COLONOSCOPY    . IR FLUORO GUIDE PORT INSERTION LEFT  02/18/2017  . IR US GUIDE VASC ACCESS LEFT  02/18/2017  . KIDNEY TRANSPLANT  07/26/2013  . LUMBAR PUNCTURE W/ INTRATHECAL CHEMOTHERAPY  08/19/14  . PORTACATH PLACEMENT    . UPPER GASTROINTESTINAL ENDOSCOPY      OB History    No data available       Home Medications    Prior to Admission medications   Medication Sig Start Date End Date Taking? Authorizing Provider  acyclovir (ZOVIRAX) 400 MG tablet Take 1 tablet (400 mg total) by mouth daily. 02/21/17  Yes Ardath Sax, MD  allopurinol (ZYLOPRIM) 300 MG tablet Take 1 tablet (300 mg total) by mouth daily. 02/21/17  Yes Perlov, Marinell Blight, MD  aspirin EC 81 MG tablet Take 81 mg by mouth daily.   Yes [provider]  atorvastatin (LIPITOR) 40 MG tablet TAKE 1 TABLET (40 MG TOTAL) BY MOUTH DAILY. 09/25/16  Yes Susy Frizzle, MD  cetirizine (ZYRTEC) 10 MG tablet Take 10 mg by mouth at bedtime.   Yes [provider]  dexamethasone (DECADRON) 4 MG tablet Take 2 tablets (8 mg total) by mouth daily. Start the day after bendamustine chemotherapy for 2 days. Take with food. 02/21/17  Yes  Ardath Sax, MD  esomeprazole (NEXIUM) 40 MG capsule Take 1 capsule (40 mg total) by mouth daily. 04/18/16  Yes Susy Frizzle, MD  gabapentin (NEURONTIN) 300 MG capsule Take 1 capsule (300 mg total) by mouth 3 (three) times daily. Patient taking differently: Take 900 mg by mouth 2 (two) times daily.  10/03/16  Yes Susy Frizzle, MD  lidocaine-prilocaine (EMLA) cream Apply to affected area once 02/21/17  Yes Perlov, Marinell Blight, MD  LORazepam (ATIVAN) 0.5 MG tablet Take 1 tablet (0.5 mg total) by mouth every 6 (six) hours as needed (Nausea or vomiting). 02/21/17  Yes Ardath Sax, MD    magnesium oxide (MAG-OX) 400 MG tablet Take 400 mg by mouth 2 (two) times daily.    Yes [provider]  morphine (MSIR) 15 MG tablet Take 1 tablet (15 mg total) by mouth every 2 (two) hours as needed for severe pain (cancer-associated pain). 03/01/17  Yes Perlov, Marinell Blight, MD  ondansetron (ZOFRAN) 8 MG tablet Take 1 tablet (8 mg total) by mouth 2 (two) times daily as needed for refractory nausea / vomiting. Start on day 2 after bendamustine chemotherapy. 02/21/17  Yes Perlov, Marinell Blight, MD  predniSONE (DELTASONE) 10 MG tablet Take 10 mg by mouth daily with breakfast.   Yes [provider]  prochlorperazine (COMPAZINE) 10 MG tablet Take 1 tablet (10 mg total) by mouth every 6 (six) hours as needed (Nausea or vomiting). 02/21/17  Yes Ardath Sax, MD  tacrolimus (PROGRAF) 0.5 MG capsule Take 1.5 mg by mouth 2 (two) times daily. Take 3 capsules (1.5 mg) BID 06/28/14  Yes [provider]  cephALEXin (KEFLEX) 500 MG capsule Take 1 capsule (500 mg total) by mouth 2 (two) times daily. 03/03/17 03/08/17  Dorie Rank, MD  predniSONE (DELTASONE) 20 MG tablet 3 tabs poqday 1-2, 2 tabs poqday 3-4, 1 tab poqday 5-6 Patient not taking: Reported on 02/04/2017 01/14/17   Susy Frizzle, MD    Family History Family History  Problem Relation Age of Onset  . Kidney disease Father   . Diabetes Father   . Lung cancer Brother   . Seizures Brother   . Seizures Sister   . Breast cancer Daughter   . Heart disease Brother     Social History Social History  Substance Use Topics  . Smoking status: Never Smoker  . Smokeless tobacco: Never Used  . Alcohol use No     Allergies   Patient has no known allergies.   Review of Systems Review of Systems  Constitutional: Positive for fever.  Respiratory: Negative for shortness of breath.   Cardiovascular: Negative for chest pain.  Gastrointestinal: Positive for abdominal pain and vomiting.  Neurological: Negative for seizures.  All  other systems reviewed and are negative.    Physical Exam Updated Vital Signs BP 133/81   Pulse 84   Temp 100.1 F (37.8 C) (Rectal)   Resp 18   Ht 1.626 m (5\' 4" )   Wt 98.4 kg (217 lb)   SpO2 (!) 88%   BMI 37.25 kg/m   Physical Exam  Constitutional: She appears listless.  General malaise  HENT:  Head: Normocephalic and atraumatic.  Right Ear: External ear normal.  Left Ear: External ear normal.  Eyes: Conjunctivae are normal. Right eye exhibits no discharge. Left eye exhibits no discharge. No scleral icterus.  Neck: Neck supple. No tracheal deviation present.  Cardiovascular: Normal rate, regular rhythm and intact distal pulses.   Pulmonary/Chest: Effort  normal and breath sounds normal. No stridor. No respiratory distress. She has no wheezes. She has no rales.  Abdominal: Soft. Bowel sounds are normal. She exhibits no distension. There is tenderness in the suprapubic area. There is no rebound and no guarding.  Musculoskeletal: She exhibits no edema or tenderness.  Neurological: She has normal strength. She appears listless. No cranial nerve deficit (no facial droop, extraocular movements intact, no slurred speech) or sensory deficit. She exhibits normal muscle tone. She displays no seizure activity. Coordination normal.  Skin: Skin is warm and dry. No rash noted. She is not diaphoretic.  Psychiatric: She has a normal mood and affect.  Nursing note and vitals reviewed.    ED Treatments / Results  Labs (all labs ordered are listed, but only abnormal results are displayed) Labs Reviewed  COMPREHENSIVE METABOLIC PANEL - Abnormal; Notable for the following:       Result Value   Glucose, Bld 101 (*)    Creatinine, Ser 1.03 (*)    Calcium 8.6 (*)    Total Protein 5.3 (*)    Albumin 3.0 (*)    GFR calc non Af Amer 54 (*)    All other components within normal limits  CBC WITH DIFFERENTIAL/PLATELET - Abnormal; Notable for the following:    WBC 3.3 (*)    RBC 3.46 (*)     Hemoglobin 11.0 (*)    HCT 33.5 (*)    RDW 17.0 (*)    Platelets 137 (*)    Lymphs Abs 0.3 (*)    All other components within normal limits  URINALYSIS, ROUTINE W REFLEX MICROSCOPIC - Abnormal; Notable for the following:    Leukocytes, UA SMALL (*)    Squamous Epithelial / LPF 0-5 (*)    All other components within normal limits  CULTURE, BLOOD (ROUTINE X 2)  CULTURE, BLOOD (ROUTINE X 2)  URINE CULTURE  PROTIME-INR  LIPASE, BLOOD  I-STAT CG4 LACTIC ACID, ED    Radiology Dg Chest 2 View  Result Date: 03/03/2017 CLINICAL DATA:  Fever.  Lymphoma. EXAM: CHEST  2 VIEW COMPARISON:  Chest CT January 08, 2017 and chest radiograph December 30, 2016 FINDINGS: Port-A-Cath tip is in the superior vena cava near the cavoatrial junction. No edema or consolidation. There is no evident edema or consolidation. Heart size and pulmonary vascular normal. There is aortic atherosclerosis. No adenopathy appreciable. No blastic or lytic bone lesions. IMPRESSION: No edema or consolidation. No adenopathy evident. There is aortic atherosclerosis. Aortic Atherosclerosis (ICD10-I70.0). Electronically Signed   By: Lowella Grip III M.D.   On: 03/03/2017 19:07    Procedures Procedures (including critical care time)  Medications Ordered in ED Medications  sodium chloride 0.9 % bolus 1,000 mL (1,000 mLs Intravenous New Bag/Given 03/03/17 1936)  piperacillin-tazobactam (ZOSYN) IVPB 3.375 g (0 g Intravenous Stopped 03/03/17 2018)  vancomycin (VANCOCIN) IVPB 1000 mg/200 mL premix (1,000 mg Intravenous New Bag/Given 03/03/17 2019)  ondansetron (ZOFRAN) injection 4 mg (4 mg Intravenous Given 03/03/17 2019)     Initial Impression / Assessment and Plan / ED Course  I have reviewed the triage vital signs and the nursing notes.  Pertinent labs & imaging results that were available during my care of the patient were reviewed by me and considered in my medical decision making (see chart for details).  Clinical Course as of Mar 03 2240  Nancy Fetter Mar 03, 2017  2111 Feeling better after IV fluids and antinausea medications  [JK]  2241 Patient states she is feeling  much better. She is to longer nauseated. She is hungry and wants to go home to get something to eat  [JK]    Clinical Course User Index [JK] Dorie Rank, MD    Patient presented to the emergency room with complaints of nausea vomiting and low-grade temperatures today. Patient is currently getting chemotherapy for lymphoma. Patient's last chemotherapy was one week ago. Patient has been having issues with nausea and vomiting but they worsened today. She was treated in the emergency room with IV fluids and antibiotics. Her symptoms have improved. She is now tolerating oral fluids and is hungry.  Patient's laboratory tests are reassuring. No signs of severe dehydration. Hemoglobin is stable. No neutropenia. Lactic acid is normal. She is not hypotensive. No signs of sepsis.  UA does suggest the possibility of a UTI.  Patient is feeling better and she would like to go home.  Discharge home with oral antibiotics. Close follow-up with her oncologist. Final Clinical Impressions(s) / ED Diagnoses   Final diagnoses:  Nausea and vomiting, intractability of vomiting not specified, unspecified vomiting type  Urinary tract infection without hematuria, site unspecified    New Prescriptions New Prescriptions   CEPHALEXIN (KEFLEX) 500 MG CAPSULE    Take 1 capsule (500 mg total) by mouth 2 (two) times daily.     Dorie Rank, MD 03/03/17 2245

## 2017-03-05 LAB — URINE CULTURE

## 2017-03-09 LAB — CULTURE, BLOOD (ROUTINE X 2)
CULTURE: NO GROWTH
Culture: NO GROWTH
SPECIAL REQUESTS: ADEQUATE
SPECIAL REQUESTS: ADEQUATE

## 2017-03-11 DIAGNOSIS — N183 Chronic kidney disease, stage 3 (moderate): Secondary | ICD-10-CM | POA: Diagnosis not present

## 2017-03-11 DIAGNOSIS — M5136 Other intervertebral disc degeneration, lumbar region: Secondary | ICD-10-CM | POA: Diagnosis not present

## 2017-03-11 DIAGNOSIS — W19XXXD Unspecified fall, subsequent encounter: Secondary | ICD-10-CM | POA: Diagnosis not present

## 2017-03-11 DIAGNOSIS — M48061 Spinal stenosis, lumbar region without neurogenic claudication: Secondary | ICD-10-CM | POA: Diagnosis not present

## 2017-03-11 DIAGNOSIS — Z940 Kidney transplant status: Secondary | ICD-10-CM | POA: Diagnosis not present

## 2017-03-11 DIAGNOSIS — I129 Hypertensive chronic kidney disease with stage 1 through stage 4 chronic kidney disease, or unspecified chronic kidney disease: Secondary | ICD-10-CM | POA: Diagnosis not present

## 2017-03-11 DIAGNOSIS — D696 Thrombocytopenia, unspecified: Secondary | ICD-10-CM | POA: Diagnosis not present

## 2017-03-11 DIAGNOSIS — G8929 Other chronic pain: Secondary | ICD-10-CM | POA: Diagnosis not present

## 2017-03-11 DIAGNOSIS — C8330 Diffuse large B-cell lymphoma, unspecified site: Secondary | ICD-10-CM | POA: Diagnosis not present

## 2017-03-14 DIAGNOSIS — C8330 Diffuse large B-cell lymphoma, unspecified site: Secondary | ICD-10-CM | POA: Diagnosis not present

## 2017-03-14 DIAGNOSIS — G8929 Other chronic pain: Secondary | ICD-10-CM | POA: Diagnosis not present

## 2017-03-14 DIAGNOSIS — Z940 Kidney transplant status: Secondary | ICD-10-CM | POA: Diagnosis not present

## 2017-03-14 DIAGNOSIS — N183 Chronic kidney disease, stage 3 (moderate): Secondary | ICD-10-CM | POA: Diagnosis not present

## 2017-03-14 DIAGNOSIS — M48061 Spinal stenosis, lumbar region without neurogenic claudication: Secondary | ICD-10-CM | POA: Diagnosis not present

## 2017-03-14 DIAGNOSIS — D696 Thrombocytopenia, unspecified: Secondary | ICD-10-CM | POA: Diagnosis not present

## 2017-03-14 DIAGNOSIS — I129 Hypertensive chronic kidney disease with stage 1 through stage 4 chronic kidney disease, or unspecified chronic kidney disease: Secondary | ICD-10-CM | POA: Diagnosis not present

## 2017-03-14 DIAGNOSIS — M5136 Other intervertebral disc degeneration, lumbar region: Secondary | ICD-10-CM | POA: Diagnosis not present

## 2017-03-14 DIAGNOSIS — W19XXXD Unspecified fall, subsequent encounter: Secondary | ICD-10-CM | POA: Diagnosis not present

## 2017-03-15 ENCOUNTER — Emergency Department (HOSPITAL_COMMUNITY)
Admission: EM | Admit: 2017-03-15 | Discharge: 2017-03-15 | Disposition: A | Discharge status: Home / Self Care | Payer: Medicare HMO | Attending: Emergency Medicine | Admitting: Emergency Medicine

## 2017-03-15 ENCOUNTER — Encounter (HOSPITAL_COMMUNITY): Payer: Self-pay

## 2017-03-15 DIAGNOSIS — I129 Hypertensive chronic kidney disease with stage 1 through stage 4 chronic kidney disease, or unspecified chronic kidney disease: Secondary | ICD-10-CM | POA: Insufficient documentation

## 2017-03-15 DIAGNOSIS — Z79899 Other long term (current) drug therapy: Secondary | ICD-10-CM | POA: Insufficient documentation

## 2017-03-15 DIAGNOSIS — R112 Nausea with vomiting, unspecified: Secondary | ICD-10-CM | POA: Insufficient documentation

## 2017-03-15 DIAGNOSIS — Z940 Kidney transplant status: Secondary | ICD-10-CM | POA: Insufficient documentation

## 2017-03-15 DIAGNOSIS — N183 Chronic kidney disease, stage 3 (moderate): Secondary | ICD-10-CM | POA: Diagnosis not present

## 2017-03-15 DIAGNOSIS — Z7982 Long term (current) use of aspirin: Secondary | ICD-10-CM | POA: Diagnosis not present

## 2017-03-15 LAB — COMPREHENSIVE METABOLIC PANEL
ALBUMIN: 3.5 g/dL (ref 3.5–5.0)
ALT: 22 U/L (ref 14–54)
ANION GAP: 8 (ref 5–15)
AST: 24 U/L (ref 15–41)
Alkaline Phosphatase: 68 U/L (ref 38–126)
BILIRUBIN TOTAL: 0.8 mg/dL (ref 0.3–1.2)
BUN: 23 mg/dL — ABNORMAL HIGH (ref 6–20)
CO2: 24 mmol/L (ref 22–32)
Calcium: 8.9 mg/dL (ref 8.9–10.3)
Chloride: 104 mmol/L (ref 101–111)
Creatinine, Ser: 1.1 mg/dL — ABNORMAL HIGH (ref 0.44–1.00)
GFR calc non Af Amer: 50 mL/min — ABNORMAL LOW (ref >60)
GFR, EST AFRICAN AMERICAN: 58 mL/min — AB (ref >60)
GLUCOSE: 107 mg/dL — AB (ref 65–99)
POTASSIUM: 4.4 mmol/L (ref 3.5–5.1)
SODIUM: 136 mmol/L (ref 135–145)
TOTAL PROTEIN: 6.2 g/dL — AB (ref 6.5–8.1)

## 2017-03-15 LAB — CBC
HEMATOCRIT: 35.3 % — AB (ref 36.0–46.0)
HEMOGLOBIN: 11.7 g/dL — AB (ref 12.0–15.0)
MCH: 31.4 pg (ref 26.0–34.0)
MCHC: 33.1 g/dL (ref 30.0–36.0)
MCV: 94.6 fL (ref 78.0–100.0)
Platelets: 106 10*3/uL — ABNORMAL LOW (ref 150–400)
RBC: 3.73 MIL/uL — AB (ref 3.87–5.11)
RDW: 16.3 % — ABNORMAL HIGH (ref 11.5–15.5)
WBC: 4.2 10*3/uL (ref 4.0–10.5)

## 2017-03-15 LAB — URINALYSIS, ROUTINE W REFLEX MICROSCOPIC
BILIRUBIN URINE: NEGATIVE
Glucose, UA: NEGATIVE mg/dL
HGB URINE DIPSTICK: NEGATIVE
KETONES UR: NEGATIVE mg/dL
NITRITE: NEGATIVE
PROTEIN: NEGATIVE mg/dL
Specific Gravity, Urine: 1.004 — ABNORMAL LOW (ref 1.005–1.030)
pH: 6 (ref 5.0–8.0)

## 2017-03-15 LAB — LIPASE, BLOOD: Lipase: 22 U/L (ref 11–51)

## 2017-03-15 MED ORDER — ONDANSETRON 8 MG PO TBDP: 8.0000 mg | ORAL_TABLET | Freq: Three times a day (TID) | ORAL | 0 refills | Status: DC | PRN

## 2017-03-15 MED ORDER — SODIUM CHLORIDE 0.9 % IV BOLUS (SEPSIS)
1000.0000 mL | Freq: Once | INTRAVENOUS | Status: AC
  Administered 2017-03-15: 1000 mL via INTRAVENOUS

## 2017-03-15 MED ORDER — ONDANSETRON 4 MG PO TBDP
4.0000 mg | ORAL_TABLET | Freq: Once | ORAL | Status: AC | PRN
  Administered 2017-03-15: 4 mg via ORAL
  Filled 2017-03-15: qty 1

## 2017-03-15 MED ORDER — SODIUM CHLORIDE 0.9 % IV SOLN
1000.0000 mL | INTRAVENOUS | Status: DC
  Administered 2017-03-15: 1000 mL via INTRAVENOUS

## 2017-03-15 MED ORDER — HEPARIN SOD (PORK) LOCK FLUSH 100 UNIT/ML IV SOLN
500.0000 [IU] | Freq: Once | INTRAVENOUS | Status: AC
  Administered 2017-03-15: 500 [IU]
  Filled 2017-03-15: qty 5

## 2017-03-15 NOTE — ED Provider Notes (Signed)
[facility] DEPT Provider Note   CSN: [id number] Arrival date & time: 03/15/17  4970     History   Chief Complaint Chief Complaint  Patient presents with  . CA Pt  . Emesis    HPI Erica Woodward is a 71 y.o. female.  HPI Patient is a 71 year old female with a history of carcinomatosis who presents the emergency department with decreased oral intake over the past 48 hours as well as nausea and vomiting despite her home nausea medication.  No significant increasing abdominal pain.  Reports no diarrhea.  She feels generally weak.  No cough or shortness of breath.  No chest pain.  No right lower extremity swelling.  Symptoms are mild to moderate in severity    Past Medical History:  Diagnosis Date  . Basal cell carcinoma   . Cancer [facility])    carcinomatosis  . Chronic headaches   . DDD (degenerative disc disease), lumbar    L4-5 right foraminal and spinal stenosis causng lumbar radiculopathy  . Diverticulosis   . GERD (gastroesophageal reflux disease)   . HLD (hyperlipidemia)   . Hx of adenomatous polyp of colon 05/21/2016  . Hypertension   . Renal disorder    kidney transplant 2005  . Status post dilation of esophageal narrowing     Patient Active Problem List   Diagnosis Date Noted  . Acute renal failure (ARF) ([place]) 02/08/2017  . Fall at home, initial encounter 02/08/2017  . CKD (chronic kidney disease), stage III 02/08/2017  . Thrombocytopenia ([place]) 02/08/2017  . Shortness of breath 01/08/2017  . Anasarca 01/08/2017  . History of B-cell lymphoma 01/08/2017  . ARF (acute renal failure) ([place]) 12/31/2016  . Hx of adenomatous polyp of colon 05/21/2016  . NHL (non-Hodgkin's lymphoma) ([place]) 08/02/2014  . GERD (gastroesophageal reflux disease) 06/16/2014  . Renal transplant recipient 07/21/2010  . HLD (hyperlipidemia) 01/17/2010  . GOUT 01/17/2010  . Essential hypertension 01/17/2010  . KNEE INJURY, RIGHT 01/17/2010    Past Surgical History:  Procedure  Laterality Date  . COLONOSCOPY    . IR FLUORO GUIDE PORT INSERTION LEFT  02/18/2017  . IR US GUIDE VASC ACCESS LEFT  02/18/2017  . KIDNEY TRANSPLANT  07/26/2013  . LUMBAR PUNCTURE W/ INTRATHECAL CHEMOTHERAPY  08/19/14  . PORTACATH PLACEMENT    . UPPER GASTROINTESTINAL ENDOSCOPY      OB History    No data available       Home Medications    Prior to Admission medications   Medication Sig Start Date End Date Taking? Authorizing Provider  aspirin EC 81 MG tablet Take 81 mg by mouth daily.   Yes [provider]  atorvastatin (LIPITOR) 40 MG tablet TAKE 1 TABLET (40 MG TOTAL) BY MOUTH DAILY. 09/25/16  Yes Susy Frizzle, MD  cetirizine (ZYRTEC) 10 MG tablet Take 10 mg by mouth at bedtime.   Yes [provider]  dexamethasone (DECADRON) 4 MG tablet Take 2 tablets (8 mg total) by mouth daily. Start the day after bendamustine chemotherapy for 2 days. Take with food. 02/21/17  Yes Ardath Sax, MD  esomeprazole (NEXIUM) 40 MG capsule Take 1 capsule (40 mg total) by mouth daily. 04/18/16  Yes Susy Frizzle, MD  gabapentin (NEURONTIN) 300 MG capsule Take 1 capsule (300 mg total) by mouth 3 (three) times daily. Patient taking differently: Take 900 mg by mouth 2 (two) times daily.  10/03/16  Yes Susy Frizzle, MD  lidocaine-prilocaine (EMLA) cream Apply to affected area once 02/21/17  Yes Perlov, Marinell Blight, MD  LORazepam (ATIVAN) 0.5 MG tablet Take 1 tablet (0.5 mg total) by mouth every 6 (six) hours as needed (Nausea or vomiting). 02/21/17  Yes Ardath Sax, MD  magnesium oxide (MAG-OX) 400 MG tablet Take 400 mg by mouth 2 (two) times daily.    Yes [provider]  morphine (MSIR) 15 MG tablet Take 1 tablet (15 mg total) by mouth every 2 (two) hours as needed for severe pain (cancer-associated pain). 03/01/17  Yes Perlov, Marinell Blight, MD  ondansetron (ZOFRAN) 8 MG tablet Take 1 tablet (8 mg total) by mouth 2 (two) times daily as needed for refractory nausea /  vomiting. Start on day 2 after bendamustine chemotherapy. 02/21/17  Yes Perlov, Marinell Blight, MD  predniSONE (DELTASONE) 10 MG tablet Take 10 mg by mouth daily with breakfast.   Yes [provider]  prochlorperazine (COMPAZINE) 10 MG tablet Take 1 tablet (10 mg total) by mouth every 6 (six) hours as needed (Nausea or vomiting). 02/21/17  Yes Ardath Sax, MD  tacrolimus (PROGRAF) 0.5 MG capsule Take 1.5 mg by mouth 2 (two) times daily. Take 3 capsules (1.5 mg) BID 06/28/14  Yes [provider]  acyclovir (ZOVIRAX) 400 MG tablet Take 1 tablet (400 mg total) by mouth daily. 02/21/17   Ardath Sax, MD  allopurinol (ZYLOPRIM) 300 MG tablet Take 1 tablet (300 mg total) by mouth daily. 02/21/17   Ardath Sax, MD  ondansetron (ZOFRAN ODT) 8 MG disintegrating tablet Take 1 tablet (8 mg total) by mouth every 8 (eight) hours as needed for nausea or vomiting. 03/15/17   Jola Schmidt, MD  predniSONE (DELTASONE) 20 MG tablet 3 tabs poqday 1-2, 2 tabs poqday 3-4, 1 tab poqday 5-6 Patient not taking: Reported on 02/04/2017 01/14/17   Susy Frizzle, MD    Family History Family History  Problem Relation Age of Onset  . Kidney disease Father   . Diabetes Father   . Lung cancer Brother   . Seizures Brother   . Seizures Sister   . Breast cancer Daughter   . Heart disease Brother     Social History Social History  Substance Use Topics  . Smoking status: Never Smoker  . Smokeless tobacco: Never Used  . Alcohol use No     Allergies   Patient has no known allergies.   Review of Systems Review of Systems  All other systems reviewed and are negative.    Physical Exam Updated Vital Signs BP 137/77 (BP Location: Right Arm)   Pulse 87   Temp 98.2 F (36.8 C) (Oral)   Resp 17   SpO2 96%   Physical Exam  Constitutional: She is oriented to person, place, and time. She appears well-developed and well-nourished. No distress.  HENT:  Head: Normocephalic and atraumatic.    Eyes: EOM are normal.  Neck: Normal range of motion.  Cardiovascular: Normal rate, regular rhythm and normal heart sounds.   Pulmonary/Chest: Effort normal and breath sounds normal.  Abdominal: Soft. She exhibits no distension. There is no tenderness.  Musculoskeletal: Normal range of motion.  Neurological: She is alert and oriented to person, place, and time.  Skin: Skin is warm and dry.  Psychiatric: She has a normal mood and affect. Judgment normal.  Nursing note and vitals reviewed.    ED Treatments / Results  Labs (all labs ordered are listed, but only abnormal results are displayed) Labs Reviewed  COMPREHENSIVE METABOLIC PANEL - Abnormal; Notable for the  following:       Result Value   Glucose, Bld 107 (*)    BUN 23 (*)    Creatinine, Ser 1.10 (*)    Total Protein 6.2 (*)    GFR calc non Af Amer 50 (*)    GFR calc Af Amer 58 (*)    All other components within normal limits  CBC - Abnormal; Notable for the following:    RBC 3.73 (*)    Hemoglobin 11.7 (*)    HCT 35.3 (*)    RDW 16.3 (*)    Platelets 106 (*)    All other components within normal limits  URINALYSIS, ROUTINE W REFLEX MICROSCOPIC - Abnormal; Notable for the following:    Color, Urine STRAW (*)    Specific Gravity, Urine 1.004 (*)    Leukocytes, UA TRACE (*)    Bacteria, UA RARE (*)    Squamous Epithelial / LPF 0-5 (*)    All other components within normal limits  LIPASE, BLOOD    EKG  EKG Interpretation None       Radiology No results found.  Procedures Procedures (including critical care time)  Medications Ordered in ED Medications  sodium chloride 0.9 % bolus 1,000 mL (0 mLs Intravenous Stopped 03/15/17 1303)    Followed by  0.9 %  sodium chloride infusion (0 mLs Intravenous Stopped 03/15/17 1418)  ondansetron (ZOFRAN-ODT) disintegrating tablet 4 mg (4 mg Oral Given 03/15/17 1012)  heparin lock flush 100 unit/mL (500 Units Intracatheter Given 03/15/17 1428)     Initial Impression /  Assessment and Plan / ED Course  I have reviewed the triage vital signs and the nursing notes.  Pertinent labs & imaging results that were available during my care of the patient were reviewed by me and considered in my medical decision making (see chart for details).     Patient feels much better after IV fluids.  Repeat abdominal exam without tenderness.  Discharge home in good condition.  Will add Zofran to her antinausea medications at home.  Close primary care follow-up.  She understands to return to the ER for new or worsening symptoms  Final Clinical Impressions(s) / ED Diagnoses   Final diagnoses:  Non-intractable vomiting with nausea, unspecified vomiting type    New Prescriptions Discharge Medication List as of 03/15/2017  1:56 PM    START taking these medications   Details  ondansetron (ZOFRAN ODT) 8 MG disintegrating tablet Take 1 tablet (8 mg total) by mouth every 8 (eight) hours as needed for nausea or vomiting., Starting Fri 03/15/2017, Print         Jola Schmidt, MD 03/15/17 1725

## 2017-03-15 NOTE — ED Notes (Signed)
ED Provider at bedside. 

## 2017-03-15 NOTE — ED Triage Notes (Signed)
Pt c/o emesis starting last night.  Denies pain and diarrhea.  Pt reports taking compazine w/o relief.  Hx of  Basal cell carcinoma and stage 4 lymphoma.  Last treatment x 21 days ago.

## 2017-03-15 NOTE — Care Management Note (Signed)
Case Management Note  CM noted pt was active with [facility].  Contacted rep to inform them pt was currently in the ED.  They advised she was active with PT and OT only.  Updated Dr. Venora Maples.

## 2017-03-15 NOTE — ED Notes (Signed)
PT not info about urine sample. Will collect when able to use again

## 2017-03-18 DIAGNOSIS — D696 Thrombocytopenia, unspecified: Secondary | ICD-10-CM | POA: Diagnosis not present

## 2017-03-18 DIAGNOSIS — I129 Hypertensive chronic kidney disease with stage 1 through stage 4 chronic kidney disease, or unspecified chronic kidney disease: Secondary | ICD-10-CM | POA: Diagnosis not present

## 2017-03-18 DIAGNOSIS — Z940 Kidney transplant status: Secondary | ICD-10-CM | POA: Diagnosis not present

## 2017-03-18 DIAGNOSIS — N183 Chronic kidney disease, stage 3 (moderate): Secondary | ICD-10-CM | POA: Diagnosis not present

## 2017-03-18 DIAGNOSIS — G8929 Other chronic pain: Secondary | ICD-10-CM | POA: Diagnosis not present

## 2017-03-18 DIAGNOSIS — W19XXXD Unspecified fall, subsequent encounter: Secondary | ICD-10-CM | POA: Diagnosis not present

## 2017-03-18 DIAGNOSIS — M48061 Spinal stenosis, lumbar region without neurogenic claudication: Secondary | ICD-10-CM | POA: Diagnosis not present

## 2017-03-18 DIAGNOSIS — M5136 Other intervertebral disc degeneration, lumbar region: Secondary | ICD-10-CM | POA: Diagnosis not present

## 2017-03-18 DIAGNOSIS — C8330 Diffuse large B-cell lymphoma, unspecified site: Secondary | ICD-10-CM | POA: Diagnosis not present

## 2017-03-21 ENCOUNTER — Other Ambulatory Visit (HOSPITAL_BASED_OUTPATIENT_CLINIC_OR_DEPARTMENT_OTHER): Payer: Medicare HMO

## 2017-03-21 ENCOUNTER — Telehealth: Payer: Self-pay | Admitting: Hematology and Oncology

## 2017-03-21 ENCOUNTER — Ambulatory Visit (HOSPITAL_BASED_OUTPATIENT_CLINIC_OR_DEPARTMENT_OTHER): Payer: Medicare HMO

## 2017-03-21 ENCOUNTER — Ambulatory Visit (HOSPITAL_BASED_OUTPATIENT_CLINIC_OR_DEPARTMENT_OTHER): Payer: Medicare HMO | Admitting: Hematology and Oncology

## 2017-03-21 VITALS — BP 167/89 | HR 84 | Temp 98.2°F | Resp 20

## 2017-03-21 VITALS — BP 169/93 | HR 91 | Temp 98.3°F | Resp 18 | Ht 64.0 in | Wt 219.4 lb

## 2017-03-21 DIAGNOSIS — R531 Weakness: Secondary | ICD-10-CM

## 2017-03-21 DIAGNOSIS — Z5111 Encounter for antineoplastic chemotherapy: Secondary | ICD-10-CM

## 2017-03-21 DIAGNOSIS — D696 Thrombocytopenia, unspecified: Secondary | ICD-10-CM

## 2017-03-21 DIAGNOSIS — C8333 Diffuse large B-cell lymphoma, intra-abdominal lymph nodes: Secondary | ICD-10-CM

## 2017-03-21 DIAGNOSIS — Z5112 Encounter for antineoplastic immunotherapy: Secondary | ICD-10-CM

## 2017-03-21 LAB — COMPREHENSIVE METABOLIC PANEL
ALT: 22 U/L (ref 0–55)
ANION GAP: 8 meq/L (ref 3–11)
AST: 28 U/L (ref 5–34)
Albumin: 3.2 g/dL — ABNORMAL LOW (ref 3.5–5.0)
Alkaline Phosphatase: 83 U/L (ref 40–150)
BUN: 15.2 mg/dL (ref 7.0–26.0)
CALCIUM: 9.1 mg/dL (ref 8.4–10.4)
CHLORIDE: 107 meq/L (ref 98–109)
CO2: 24 mEq/L (ref 22–29)
Creatinine: 1.1 mg/dL (ref 0.6–1.1)
EGFR: 49 mL/min/{1.73_m2} — ABNORMAL LOW (ref >90)
Glucose: 116 mg/dl (ref 70–140)
POTASSIUM: 3.7 meq/L (ref 3.5–5.1)
Sodium: 139 mEq/L (ref 136–145)
Total Bilirubin: 0.63 mg/dL (ref 0.20–1.20)
Total Protein: 6.1 g/dL — ABNORMAL LOW (ref 6.4–8.3)

## 2017-03-21 LAB — LACTATE DEHYDROGENASE: LDH: 322 U/L — AB (ref 125–245)

## 2017-03-21 LAB — CBC WITH DIFFERENTIAL/PLATELET
BASO%: 0.2 % (ref 0.0–2.0)
Basophils Absolute: 0 10*3/uL (ref 0.0–0.1)
EOS ABS: 0.1 10*3/uL (ref 0.0–0.5)
EOS%: 2.1 % (ref 0.0–7.0)
HEMATOCRIT: 36 % (ref 34.8–46.6)
HGB: 11.7 g/dL (ref 11.6–15.9)
LYMPH%: 7.7 % — ABNORMAL LOW (ref 14.0–49.7)
MCH: 31.6 pg (ref 25.1–34.0)
MCHC: 32.5 g/dL (ref 31.5–36.0)
MCV: 97.3 fL (ref 79.5–101.0)
MONO#: 0.2 10*3/uL (ref 0.1–0.9)
MONO%: 3.8 % (ref 0.0–14.0)
NEUT#: 4.6 10*3/uL (ref 1.5–6.5)
NEUT%: 86.2 % — ABNORMAL HIGH (ref 38.4–76.8)
Platelets: 62 10*3/uL — ABNORMAL LOW (ref 145–400)
RBC: 3.7 10*6/uL (ref 3.70–5.45)
RDW: 16.3 % — AB (ref 11.2–14.5)
WBC: 5.3 10*3/uL (ref 3.9–10.3)
lymph#: 0.4 10*3/uL — ABNORMAL LOW (ref 0.9–3.3)

## 2017-03-21 LAB — URIC ACID: URIC ACID, SERUM: 4 mg/dL (ref 2.6–7.4)

## 2017-03-21 LAB — MAGNESIUM: Magnesium: 1.9 mg/dl (ref 1.5–2.5)

## 2017-03-21 MED ORDER — SODIUM CHLORIDE 0.9% FLUSH
10.0000 mL | INTRAVENOUS | Status: AC | PRN
  Administered 2017-03-21: 10 mL
  Filled 2017-03-21: qty 10

## 2017-03-21 MED ORDER — HEPARIN SOD (PORK) LOCK FLUSH 100 UNIT/ML IV SOLN
500.0000 [IU] | INTRAVENOUS | Status: AC | PRN
  Administered 2017-03-21: 500 [IU]
  Filled 2017-03-21: qty 5

## 2017-03-21 MED ORDER — SODIUM CHLORIDE 0.9 % IV SOLN
INTRAVENOUS | Status: DC
  Administered 2017-03-21: 11:00:00 via INTRAVENOUS

## 2017-03-21 NOTE — Telephone Encounter (Signed)
Gave patient avs and calendar for lab and MD appointment however waiting for response for chemo

## 2017-03-22 ENCOUNTER — Ambulatory Visit: Payer: Self-pay

## 2017-03-22 LAB — PHOSPHORUS: PHOSPHORUS: 3.4 mg/dL (ref 2.5–4.5)

## 2017-03-22 NOTE — Progress Notes (Signed)
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 Lymphoma was diagnosed in Dec 2015 and treated with dose-adjusted R-EPOCH with the patient receiving full 6 cycles of chemotherapy with 4 cycles of prophylactic intrathecal treatment. Treatment was conducted by [facility] system with complete response documented by imaging. Earlier this year, patient has developed symptomatic, biopsy-confirmed recurrence of the malignancy consistent with diffuse large B-cell lymphoma.  After extensive discussion of possible options, patient initiated palliative systemic chemoimmunotherapy with rituximab plus bendamustine receiving the initial dose of chemotherapy 4weeks ago. Patient continues to remain symptomatic with recurrent physicist to the emergency room. She has episodes of weakness with shakiness that are possibly related to episodic hypoglycemia. Etiology of that is not entirely clear at this time. Recommended patient treated with oral sugary beverage to see if the symptoms resolve rapidly. If they do not, we'll investigate for additional possible etiologies.  Clinical presentation and lab work review today. Laboratory demonstrates persistent thrombocytopenia with platelet count of 62 which is not permissive to proceed with the next cycle of chemotherapy at this time.  Plan: --Schedule IV Fluids Q Mon/Thu --No chemo today --RTC 1wk with labs, clinic visit, possible 2nd cycle of systemic chemoimmunotherapy. A stone tolerance of the initial cycle, we will reduce the dose of bendamustine by 50% by administering it on d1 only   Voice recognition software was used and creation of this note. Despite my best effort at editing the text, some misspelling/errors may have occurred.   Orders Placed This Encounter   Procedures  . CBC with Differential    Standing Status:   Future    Standing Expiration Date:   03/21/2018  . Comprehensive metabolic panel    Standing Status:   Future    Standing Expiration Date:   03/21/2018  . Magnesium    Standing Status:   Future    Standing Expiration Date:   03/21/2018  . Uric acid    Standing Status:   Future    Standing Expiration Date:   03/21/2018    Cancer Staging No matching staging information was found for the patient.  All questions were answered.  . The patient knows to call the clinic with any problems, questions or concerns.  This note was electronically signed.    History of Presenting Illness Erica Woodward is a 71 y.o. female followed in the [place] for monitoring and treatment of recurrent diffuse large B-cell lymphomaIn setting of renal transplant. Patient is currently undergoing palliative systemic chemoimmunotherapy with rituximab and bendamustine. She presents to the clinic today to receive the second cycle of therapy.  In the interim, patient had 2 additional visits to the emergency room -- one with symptoms of abdominal discomfort, nausea, and vomiting, and the other visit to the emergency room with non-neutropenic fever. Additionally, patient describes episodic weakness and shakiness at home at least one episode of fall without significant head injury or persistent that occurred due to one of these episodes. Patient denies any chest pain, lightheadedness or dizziness, but feels her muscles become weak and shaky as well as some clamminess of the skin. The episodes resolve on their own, and patient has not reported them to any providers previously, to my knowledge.  At the present time, patient feels reasonably well, but continues to have weakness. Abdominal pain appears to have resolved at this time.  Oncological/hematological  History:   NHL (non-Hodgkin's lymphoma) ([place])   06/25/2014 Initial Diagnosis    Diffuse large B-cell lymphoma  (followed and treated by Hessie Dibble, MD & Lovenia Shuck Despina Hidden, PA-C at [facility]); As per the last note: "She [patient] presented to an outside hospital in December 2015 with worsening nausea, vomiting, and left lower quadrant abdominal pain. CT abdomen/ pelvis was concerning for peritoneal carcinomatosis. She was transferred to [facility] service for further management. A CT abdomen/ pelvis with contrast on 06/17/14 showed diffuse peritoneal metastatic disease, ascites, bilateral pleural effusions, and focal thickening of a loop of small bowel.   A paracentesis performed on 12/7 came back showing a high-grade malignant neoplasm favoring lymphoma. She underwent an open exploratory laparotomy and biopsy of the thickened omentum on 12/11. Pathology showed aggressive diffuse large B-cell lymphoma with Ki-67 of 100% that was strongly positive for CD20, CD10, and BCL-6 and negative for BCL-2. FISH studies were negative for MYC, BCL6, and IgH/BCL2 translocations. EBV PCR and HIV were negative. Bone marrow biopsy showed a mild CD20+ interstitial B-cell lymphocytosis. Labs showed an elevated LDH of 326 and anemia (Hgb 10.5). She did not have a PET CT scan at diagnosis as chemotherapy was immediately started as an inpatient. CSF showed atypical lymphocytes favoring benign reactive pleocytosis but flow cytometry was not performed. She reported a 40 lb weight loss as well as progressive night sweats over 3 months. Thus, she had stage IVB disease at presentation with IPI 5.      07/02/2014 - 11/01/2014 Chemotherapy    Dose Adjusted R-EPOCH: --Cycle #1, 07/02/14: Dose Level 1 --Cycle #2, 07/26/14: Dose Level 2 --Cycle #3, 08/16/14: Dose Level 2 --Cycle #4, 09/06/14: Dose Level 2 --Cycle #5, 09/27/14: Dose Level 2 --Cycle #6, 11/01/14: Dose Level 1 -- Delayed by 1 week due to poor tolerance to treatment and decline in her performance status. Dose level reduced to 1.  Completed IT  chemotherapy x 4 doses.          09/20/2014 Imaging    External PET-CT (after 4 cycles of systemic therapy): No evidence of FDG avid recurrent or metastatic disease. Interval resolution of diffuse mesenteric edema and abdominopelvic ascites. Interval resolution of small bilateral pleural effusions. Diffuse metabolic activity in the bone marrow consistent with chemotherapy treatment. Right adnexal cyst measuring 3.3 cm. Recommend annual ultrasound surveillance for this lesion.        12/29/2016 Relapse/Recurrence    Patient presented with recurrent abdominal pain, which led to CT of the abdomen with contrast and pain medications. Imaging revealed recurrent lymphadenopathy in the abdomen strongly suspicious for possible recurrent lymphoma. Presentation was complicated by development of delirium likely due to combination of severe pain, opioid pain medications, acute kidney injury precipitated by hyperuricemia and possibly contrast administration in a dehydrated patient.  Patient required hospitalization, stabilized and improved significantly. Nevertheless, continues to have multiple problems not the least of which is persistent peripheral swelling, worsening performance status, dyspnea on exertion.      01/25/2017 Imaging    PET-CT: Intensely hypermetabolic retroperitoneal, pelvic, sigmoid mesocolon, and perirectal adenopathy compatible with malignancy. There is also a small but hypermetabolic paraesophageal lymph node in the thorax. Appearance compatible with active lymphoma.      01/25/2017 Imaging    MRI L-spine: Degenerative lumbar spondylosis with multilevel disc disease and facet disease. The most significant findings are at L4-5 where there is severe multifactorial spinal and bilateral lateral recess stenosis and significant right foraminal stenosis.  02/01/2017 Pathology Results    LN Core Bx: High-grade large cell lymphoma; IHC -- positive for LCA, CD79.9, CD20, CD10, BCL2, BCL6 &  negative for CD30, CD34, CD138, CD3; BM Bx: No evidence of involvement by a monoclonal lymphoproliferative process      02/22/2017 -  Chemotherapy    Rituximab 378m/m2, d1 + Bendamustine 952mm2, d1,2 Q28 D --Cycle #1, 02/22/17:        Medical History: Past Medical History:  Diagnosis Date  . [facility]al cell carcinoma   [facility]   carcinomatosis  . Chronic headaches   . DDD (degenerative disc disease), lumbar    L4-5 right foraminal and spinal stenosis causng lumbar radiculopathy  . Diverticulosis   . GERD (gastroesophageal reflux disease)   . HLD (hyperlipidemia)   . Hx of adenomatous polyp of colon 05/21/2016  . Hypertension   . Renal disorder    kidney transplant 2005  . Status post dilation of esophageal narrowing     Surgical History: Past Surgical History:  Procedure Laterality Date  . COLONOSCOPY    . IR FLUORO GUIDE PORT INSERTION LEFT  02/18/2017  . IR [place] VASC ACCESS LEFT  02/18/2017  . KIDNEY TRANSPLANT  07/26/2013  . LUMBAR PUNCTURE W/ INTRATHECAL CHEMOTHERAPY  08/19/14  . PORTACATH PLACEMENT    . UPPER GASTROINTESTINAL ENDOSCOPY      Family History: Family History  Problem Relation Age of Onset  . Kidney disease Father   . Diabetes Father   . Lung cancer Brother   . Seizures Brother   . Seizures Sister   . Breast cancer Daughter   . Heart disease Brother     Social History: Social History   Social History  . Marital status: Married    Spouse name: N/A  . Number of children: 4  . Years of education: N/A   Occupational History  . retired    Social History Main Topics  . Smoking status: Never Smoker  . Smokeless tobacco: Never Used  . Alcohol use No  . Drug use: No  . Sexual activity: Not on file   Other Topics Concern  . Not on file   Social History Narrative  . No narrative on file    Allergies: No Known Allergies  Medications:  Current Outpatient Prescriptions  Medication Sig Dispense Refill  . acyclovir (ZOVIRAX) 400  MG tablet Take 1 tablet (400 mg total) by mouth daily. 30 tablet 3  . allopurinol (ZYLOPRIM) 300 MG tablet Take 1 tablet (300 mg total) by mouth daily. 30 tablet 3  . aspirin EC 81 MG tablet Take 81 mg by mouth daily.    . Marland Kitchentorvastatin (LIPITOR) 40 MG tablet TAKE 1 TABLET (40 MG TOTAL) BY MOUTH DAILY. 90 tablet 1  . cetirizine (ZYRTEC) 10 MG tablet Take 10 mg by mouth at bedtime.    . Marland Kitchenexamethasone (DECADRON) 4 MG tablet Take 2 tablets (8 mg total) by mouth daily. Start the day after bendamustine chemotherapy for 2 days. Take with food. 30 tablet 1  . esomeprazole (NEXIUM) 40 MG capsule Take 1 capsule (40 mg total) by mouth daily. 90 capsule 3  . gabapentin (NEURONTIN) 300 MG capsule Take 1 capsule (300 mg total) by mouth 3 (three) times daily. (Patient taking differently: Take 900 mg by mouth 2 (two) times daily. ) 270 capsule 3  . lidocaine-prilocaine (EMLA) cream Apply to affected area once 30 g 3  . LORazepam (ATIVAN) 0.5 MG tablet Take 1 tablet (0.5 mg total)  by mouth every 6 (six) hours as needed (Nausea or vomiting). 30 tablet 0  . magnesium oxide (MAG-OX) 400 MG tablet Take 400 mg by mouth 2 (two) times daily.     Marland Kitchen morphine (MSIR) 15 MG tablet Take 1 tablet (15 mg total) by mouth every 2 (two) hours as needed for severe pain (cancer-associated pain). 50 tablet 0  . ondansetron (ZOFRAN ODT) 8 MG disintegrating tablet Take 1 tablet (8 mg total) by mouth every 8 (eight) hours as needed for nausea or vomiting. 10 tablet 0  . ondansetron (ZOFRAN) 8 MG tablet Take 1 tablet (8 mg total) by mouth 2 (two) times daily as needed for refractory nausea / vomiting. Start on day 2 after bendamustine chemotherapy. 30 tablet 1  . predniSONE (DELTASONE) 10 MG tablet Take 10 mg by mouth daily with breakfast.    . predniSONE (DELTASONE) 20 MG tablet 3 tabs poqday 1-2, 2 tabs poqday 3-4, 1 tab poqday 5-6 (Patient not taking: Reported on 02/04/2017) 12 tablet 0  . prochlorperazine (COMPAZINE) 10 MG tablet Take 1  tablet (10 mg total) by mouth every 6 (six) hours as needed (Nausea or vomiting). 30 tablet 1  . tacrolimus (PROGRAF) 0.5 MG capsule Take 1.5 mg by mouth 2 (two) times daily. Take 3 capsules (1.5 mg) BID     No current facility-administered medications for this visit.     Review of Systems: Review of Systems  All other systems reviewed and are negative.    PHYSICAL EXAMINATION Blood pressure (!) 169/93, pulse 91, temperature 98.3 F (36.8 C), temperature source Oral, resp. rate 18, height _0  (1.626 m), weight 219 lb 6.4 oz (99.5 kg), SpO2 97 %.  ECOG PERFORMANCE STATUS: 2 - Symptomatic, <50% confined to bed  Physical Exam  Constitutional: She is oriented to person, place, and time.  Patient appears fatigued.  HENT:  Head: Normocephalic and atraumatic.  Mouth/Throat: Oropharynx is clear and moist. No oropharyngeal exudate.  No oral thrush. No mucosal ulcers  Eyes: Pupils are equal, round, and reactive to light. Conjunctivae are normal. No scleral icterus.  Neck: No tracheal deviation present. No thyromegaly present.  Cardiovascular: Normal rate and regular rhythm.   No murmur heard. Distant heart sounds  Pulmonary/Chest: Effort normal. No stridor. She has no wheezes.  Upper lungs are clear to auscultation. Lower lungs with bilateral dependent crackles and possible bones to percussion at the bases of the lungs.  Abdominal: Soft. She exhibits no distension and no mass. There is tenderness. There is no rebound and no guarding.  If no palpable tenderness in the left upper quadrant of the abdomen without rebound or peritoneal irritation. No palpable mass.  Musculoskeletal:  Tenderness to palpation over the lower lumbar spine and sacrum. No radiation down in the extremities.  Lymphadenopathy:       Head (right side): No submandibular and no occipital adenopathy present.       Head (left side): No submandibular and no occipital adenopathy present.    She has no cervical adenopathy.     She has no axillary adenopathy.       Right: No inguinal and no supraclavicular adenopathy present.       Left: No inguinal and no supraclavicular adenopathy present.  Neurological: She is oriented to person, place, and time. She has normal sensation, normal reflexes and intact cranial nerves.  Skin: She is not diaphoretic.     LABORATORY DATA: I have personally reviewed the data as listed: Appointment on 03/21/2017  Component  Date Value Ref Range Status  . WBC 03/21/2017 5.3  3.9 - 10.3 10e3/uL Final  . NEUT# 03/21/2017 4.6  1.5 - 6.5 10e3/uL Final  . HGB 03/21/2017 11.7  11.6 - 15.9 g/dL Final  . HCT 03/21/2017 36.0  34.8 - 46.6 % Final  . Platelets 03/21/2017 62* 145 - 400 10e3/uL Final  . MCV 03/21/2017 97.3  79.5 - 101.0 fL Final  . MCH 03/21/2017 31.6  25.1 - 34.0 pg Final  . MCHC 03/21/2017 32.5  31.5 - 36.0 g/dL Final  . RBC 03/21/2017 3.70  3.70 - 5.45 10e6/uL Final  . RDW 03/21/2017 16.3* 11.2 - 14.5 % Final  . lymph# 03/21/2017 0.4* 0.9 - 3.3 10e3/uL Final  . MONO# 03/21/2017 0.2  0.1 - 0.9 10e3/uL Final  . Eosinophils Absolute 03/21/2017 0.1  0.0 - 0.5 10e3/uL Final  . Basophils Absolute 03/21/2017 0.0  0.0 - 0.1 10e3/uL Final  . NEUT% 03/21/2017 86.2* 38.4 - 76.8 % Final  . LYMPH% 03/21/2017 7.7* 14.0 - 49.7 % Final  . MONO% 03/21/2017 3.8  0.0 - 14.0 % Final  . EOS% 03/21/2017 2.1  0.0 - 7.0 % Final  . BASO% 03/21/2017 0.2  0.0 - 2.0 % Final  . Sodium 03/21/2017 139  136 - 145 mEq/L Final  . Potassium 03/21/2017 3.7  3.5 - 5.1 mEq/L Final  . Chloride 03/21/2017 107  98 - 109 mEq/L Final  . CO2 03/21/2017 24  22 - 29 mEq/L Final  . Glucose 03/21/2017 116  70 - 140 mg/dl Final   Glucose reference range is for nonfasting patients. Fasting glucose reference range is 70- 100.  Marland Kitchen BUN 03/21/2017 15.2  7.0 - 26.0 mg/dL Final  . Creatinine 03/21/2017 1.1  0.6 - 1.1 mg/dL Final  . Total Bilirubin 03/21/2017 0.63  0.20 - 1.20 mg/dL Final  . Alkaline Phosphatase  03/21/2017 83  40 - 150 U/L Final  . AST 03/21/2017 28  5 - 34 U/L Final  . ALT 03/21/2017 22  0 - 55 U/L Final  . Total Protein 03/21/2017 6.1* 6.4 - 8.3 g/dL Final  . Albumin 03/21/2017 3.2* 3.5 - 5.0 g/dL Final  . Calcium 03/21/2017 9.1  8.4 - 10.4 mg/dL Final  . Anion Gap 03/21/2017 8  3 - 11 mEq/L Final  . EGFR 03/21/2017 49* >90 ml/min/1.73 m2 Final   eGFR is calculated using the CKD-EPI Creatinine Equation (2009)  . LDH 03/21/2017 322* 125 - 245 U/L Final  . Uric Acid, Serum 03/21/2017 4.0  2.6 - 7.4 mg/dl Final  . Magnesium 03/21/2017 1.9  1.5 - 2.5 mg/dl Final  . Phosphorus, Ser 03/21/2017 3.4  2.5 - 4.5 mg/dL Final  Admission on 03/15/2017, Discharged on 03/15/2017  Component Date Value Ref Range Status  . Lipase 03/15/2017 22  11 - 51 U/L Final  . Sodium 03/15/2017 136  135 - 145 mmol/L Final  . Potassium 03/15/2017 4.4  3.5 - 5.1 mmol/L Final  . Chloride 03/15/2017 104  101 - 111 mmol/L Final  . CO2 03/15/2017 24  22 - 32 mmol/L Final  . Glucose, Bld 03/15/2017 107* 65 - 99 mg/dL Final  . BUN 03/15/2017 23* 6 - 20 mg/dL Final  . Creatinine, Ser 03/15/2017 1.10* 0.44 - 1.00 mg/dL Final  . Calcium 03/15/2017 8.9  8.9 - 10.3 mg/dL Final  . Total Protein 03/15/2017 6.2* 6.5 - 8.1 g/dL Final  . Albumin 03/15/2017 3.5  3.5 - 5.0 g/dL Final  . AST 03/15/2017 24  15 - 41 U/L Final  . ALT 03/15/2017 22  14 - 54 U/L Final  . Alkaline Phosphatase 03/15/2017 68  38 - 126 U/L Final  . Total Bilirubin 03/15/2017 0.8  0.3 - 1.2 mg/dL Final  . GFR calc non Af Amer 03/15/2017 50* >60 mL/min Final  . GFR calc Af Amer 03/15/2017 58* >60 mL/min Final   Comment: (NOTE) The eGFR has been calculated using the CKD EPI equation. This calculation has not been validated in all clinical situations. eGFR's persistently <60 mL/min signify possible Chronic Kidney Disease.   . Anion gap 03/15/2017 8  5 - 15 Final  . WBC 03/15/2017 4.2  4.0 - 10.5 K/uL Final  . RBC 03/15/2017 3.73* 3.87 - 5.11  MIL/uL Final  . Hemoglobin 03/15/2017 11.7* 12.0 - 15.0 g/dL Final  . HCT 03/15/2017 35.3* 36.0 - 46.0 % Final  . MCV 03/15/2017 94.6  78.0 - 100.0 fL Final  . MCH 03/15/2017 31.4  26.0 - 34.0 pg Final  . MCHC 03/15/2017 33.1  30.0 - 36.0 g/dL Final  . RDW 03/15/2017 16.3* 11.5 - 15.5 % Final  . Platelets 03/15/2017 106* 150 - 400 K/uL Final   Comment: SPECIMEN CHECKED FOR CLOTS REPEATED TO VERIFY PLATELET COUNT CONFIRMED BY SMEAR   . Color, Urine 03/15/2017 STRAW* YELLOW Final  . APPearance 03/15/2017 CLEAR  CLEAR Final  . Specific Gravity, Urine 03/15/2017 1.004* 1.005 - 1.030 Final  . pH 03/15/2017 6.0  5.0 - 8.0 Final  . Glucose, UA 03/15/2017 NEGATIVE  NEGATIVE mg/dL Final  . Hgb urine dipstick 03/15/2017 NEGATIVE  NEGATIVE Final  . Bilirubin Urine 03/15/2017 NEGATIVE  NEGATIVE Final  . Ketones, ur 03/15/2017 NEGATIVE  NEGATIVE mg/dL Final  . Protein, ur 03/15/2017 NEGATIVE  NEGATIVE mg/dL Final  . Nitrite 03/15/2017 NEGATIVE  NEGATIVE Final  . Leukocytes, UA 03/15/2017 TRACE* NEGATIVE Final  . RBC / HPF 03/15/2017 0-5  0 - 5 RBC/hpf Final  . WBC, UA 03/15/2017 0-5  0 - 5 WBC/hpf Final  . Bacteria, UA 03/15/2017 RARE* NONE SEEN Final  . Squamous Epithelial / LPF 03/15/2017 0-5* NONE SEEN Final       Ardath Sax, MD

## 2017-03-25 ENCOUNTER — Other Ambulatory Visit: Payer: Self-pay | Admitting: Hematology and Oncology

## 2017-03-25 ENCOUNTER — Ambulatory Visit (HOSPITAL_COMMUNITY)
Admission: RE | Admit: 2017-03-25 | Discharge: 2017-03-25 | Disposition: A | Discharge status: Home / Self Care | Payer: Medicare HMO | Source: Ambulatory Visit | Attending: Hematology and Oncology | Admitting: Hematology and Oncology

## 2017-03-25 DIAGNOSIS — C8333 Diffuse large B-cell lymphoma, intra-abdominal lymph nodes: Secondary | ICD-10-CM | POA: Insufficient documentation

## 2017-03-25 DIAGNOSIS — Z452 Encounter for adjustment and management of vascular access device: Secondary | ICD-10-CM | POA: Insufficient documentation

## 2017-03-25 MED ORDER — SODIUM CHLORIDE 0.9% FLUSH
10.0000 mL | INTRAVENOUS | Status: AC | PRN
  Administered 2017-03-25: 10 mL

## 2017-03-25 MED ORDER — HEPARIN SOD (PORK) LOCK FLUSH 100 UNIT/ML IV SOLN
500.0000 [IU] | INTRAVENOUS | Status: AC | PRN
  Administered 2017-03-25: 500 [IU]
  Filled 2017-03-25: qty 5

## 2017-03-25 MED ORDER — SODIUM CHLORIDE 0.9 % IV SOLN
Freq: Once | INTRAVENOUS | Status: AC
  Administered 2017-03-25: 14:00:00 via INTRAVENOUS

## 2017-03-25 NOTE — Progress Notes (Signed)
Provider: Truett Mainland  Treatment: 0.9 % sodium chloride infusion via port a cath  Patient tolerated procedure well with no transfusion reaction. Discharge instructions given to patient and patient states an understanding. Patient alert, oriented, and ambulatory at time of discharge.

## 2017-03-25 NOTE — Assessment & Plan Note (Addendum)
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 Lymphoma was diagnosed in Dec 2015 and treated with dose-adjusted R-EPOCH with the patient receiving full 6 cycles of chemotherapy with 4 cycles of prophylactic intrathecal treatment. Treatment was conducted by [facility] system with complete response documented by imaging. Earlier this year, patient has developed symptomatic, biopsy-confirmed recurrence of the malignancy consistent with diffuse large B-cell lymphoma.  After extensive discussion of possible options, patient initiated palliative systemic chemoimmunotherapy with rituximab plus bendamustine receiving the initial dose of chemotherapy 4weeks ago. Patient continues to remain symptomatic with recurrent physicist to the emergency room. She has episodes of weakness with shakiness that are possibly related to episodic hypoglycemia. Etiology of that is not entirely clear at this time. Recommended patient treated with oral sugary beverage to see if the symptoms resolve rapidly. If they do not, we'll investigate for additional possible etiologies.  Clinical presentation and lab work review today. Laboratory demonstrates persistent thrombocytopenia with platelet count of 62 which is not permissive to proceed with the next cycle of chemotherapy at this time.  Plan: --Schedule IV Fluids Q Mon/Thu --No chemo today --RTC 1wk with labs, clinic visit, possible 2nd cycle of systemic chemoimmunotherapy. A stone tolerance of the initial cycle, we will reduce the dose of bendamustine by 50% by administering it on d1 only

## 2017-03-25 NOTE — Discharge Instructions (Signed)
Patient received 1 liter of Normal Saline over 2 hours via port a cath.

## 2017-03-25 NOTE — Addendum Note (Signed)
Addended by: Ardath Sax on: 03/25/2017 01:20 PM   Modules accepted: Orders

## 2017-03-26 ENCOUNTER — Other Ambulatory Visit: Payer: Self-pay

## 2017-03-26 ENCOUNTER — Telehealth: Payer: Self-pay | Admitting: *Deleted

## 2017-03-26 MED ORDER — MAGNESIUM OXIDE 400 MG PO TABS: 400.0000 mg | ORAL_TABLET | Freq: Two times a day (BID) | ORAL | 3 refills | Status: AC

## 2017-03-26 NOTE — Telephone Encounter (Signed)
Received VM from [place], [facility] PT with [facility]. [phone]~ telephone.   Requested VO to continue PT.   Call placed to PT for more information. [place].

## 2017-03-27 NOTE — Telephone Encounter (Signed)
Call placed to [place], [facility] PT with [facility].   States that he missed PT visit last week and is requesting order to make up missed PT visit.   VO given.

## 2017-03-28 ENCOUNTER — Other Ambulatory Visit: Payer: Self-pay

## 2017-03-28 ENCOUNTER — Ambulatory Visit: Payer: Self-pay | Admitting: Hematology and Oncology

## 2017-03-29 ENCOUNTER — Telehealth: Payer: Self-pay

## 2017-03-29 ENCOUNTER — Ambulatory Visit: Payer: Self-pay

## 2017-03-29 ENCOUNTER — Ambulatory Visit: Payer: Self-pay | Admitting: Hematology and Oncology

## 2017-03-29 ENCOUNTER — Other Ambulatory Visit: Payer: Self-pay

## 2017-03-29 DIAGNOSIS — W19XXXD Unspecified fall, subsequent encounter: Secondary | ICD-10-CM | POA: Diagnosis not present

## 2017-03-29 DIAGNOSIS — M5136 Other intervertebral disc degeneration, lumbar region: Secondary | ICD-10-CM | POA: Diagnosis not present

## 2017-03-29 DIAGNOSIS — C8330 Diffuse large B-cell lymphoma, unspecified site: Secondary | ICD-10-CM | POA: Diagnosis not present

## 2017-03-29 DIAGNOSIS — D696 Thrombocytopenia, unspecified: Secondary | ICD-10-CM | POA: Diagnosis not present

## 2017-03-29 DIAGNOSIS — M48061 Spinal stenosis, lumbar region without neurogenic claudication: Secondary | ICD-10-CM | POA: Diagnosis not present

## 2017-03-29 DIAGNOSIS — N183 Chronic kidney disease, stage 3 (moderate): Secondary | ICD-10-CM | POA: Diagnosis not present

## 2017-03-29 DIAGNOSIS — I129 Hypertensive chronic kidney disease with stage 1 through stage 4 chronic kidney disease, or unspecified chronic kidney disease: Secondary | ICD-10-CM | POA: Diagnosis not present

## 2017-03-29 DIAGNOSIS — G8929 Other chronic pain: Secondary | ICD-10-CM | POA: Diagnosis not present

## 2017-03-29 DIAGNOSIS — Z940 Kidney transplant status: Secondary | ICD-10-CM | POA: Diagnosis not present

## 2017-03-29 NOTE — Telephone Encounter (Signed)
Spoke with pt daughter regarding appt today. Wondering if able to change to next week. MD okay to change. Scheduling message sent and appointments cancelled for today.

## 2017-04-01 ENCOUNTER — Other Ambulatory Visit: Payer: Self-pay | Admitting: *Deleted

## 2017-04-01 DIAGNOSIS — M545 Low back pain, unspecified: Secondary | ICD-10-CM

## 2017-04-01 DIAGNOSIS — C8333 Diffuse large B-cell lymphoma, intra-abdominal lymph nodes: Secondary | ICD-10-CM

## 2017-04-01 MED ORDER — MORPHINE SULFATE 15 MG PO TABS: 15.0000 mg | ORAL_TABLET | ORAL | 0 refills | Status: DC | PRN

## 2017-04-02 ENCOUNTER — Telehealth: Payer: Self-pay | Admitting: Hematology and Oncology

## 2017-04-02 NOTE — Telephone Encounter (Signed)
Scheduled apt per 9/14 sch message - left message with appt date and time.

## 2017-04-04 ENCOUNTER — Other Ambulatory Visit: Payer: Self-pay

## 2017-04-04 ENCOUNTER — Other Ambulatory Visit: Payer: Self-pay | Admitting: Hematology and Oncology

## 2017-04-04 ENCOUNTER — Ambulatory Visit (HOSPITAL_BASED_OUTPATIENT_CLINIC_OR_DEPARTMENT_OTHER): Payer: Medicare HMO

## 2017-04-04 ENCOUNTER — Other Ambulatory Visit (HOSPITAL_BASED_OUTPATIENT_CLINIC_OR_DEPARTMENT_OTHER): Payer: Medicare HMO

## 2017-04-04 ENCOUNTER — Encounter: Payer: Self-pay | Admitting: Hematology and Oncology

## 2017-04-04 ENCOUNTER — Telehealth: Payer: Self-pay | Admitting: Hematology and Oncology

## 2017-04-04 ENCOUNTER — Ambulatory Visit (HOSPITAL_BASED_OUTPATIENT_CLINIC_OR_DEPARTMENT_OTHER): Payer: Medicare HMO | Admitting: Hematology and Oncology

## 2017-04-04 VITALS — BP 144/89 | HR 79 | Temp 98.8°F | Resp 18

## 2017-04-04 VITALS — BP 121/68 | HR 111 | Temp 97.8°F | Resp 18 | Ht 64.0 in | Wt 222.1 lb

## 2017-04-04 DIAGNOSIS — M545 Low back pain, unspecified: Secondary | ICD-10-CM

## 2017-04-04 DIAGNOSIS — Z940 Kidney transplant status: Secondary | ICD-10-CM

## 2017-04-04 DIAGNOSIS — C8333 Diffuse large B-cell lymphoma, intra-abdominal lymph nodes: Secondary | ICD-10-CM | POA: Diagnosis not present

## 2017-04-04 DIAGNOSIS — R531 Weakness: Secondary | ICD-10-CM | POA: Diagnosis not present

## 2017-04-04 DIAGNOSIS — C8338 Diffuse large B-cell lymphoma, lymph nodes of multiple sites: Secondary | ICD-10-CM

## 2017-04-04 DIAGNOSIS — Z5112 Encounter for antineoplastic immunotherapy: Secondary | ICD-10-CM | POA: Diagnosis not present

## 2017-04-04 DIAGNOSIS — D696 Thrombocytopenia, unspecified: Secondary | ICD-10-CM

## 2017-04-04 DIAGNOSIS — Z5111 Encounter for antineoplastic chemotherapy: Secondary | ICD-10-CM

## 2017-04-04 LAB — CBC WITH DIFFERENTIAL/PLATELET
BASO%: 0.5 % (ref 0.0–2.0)
Basophils Absolute: 0 10*3/uL (ref 0.0–0.1)
EOS ABS: 0.1 10*3/uL (ref 0.0–0.5)
EOS%: 2.2 % (ref 0.0–7.0)
HEMATOCRIT: 36.7 % (ref 34.8–46.6)
HGB: 12.1 g/dL (ref 11.6–15.9)
LYMPH#: 0.5 10*3/uL — AB (ref 0.9–3.3)
LYMPH%: 15.6 % (ref 14.0–49.7)
MCH: 32.4 pg (ref 25.1–34.0)
MCHC: 33 g/dL (ref 31.5–36.0)
MCV: 98.1 fL (ref 79.5–101.0)
MONO#: 0.5 10*3/uL (ref 0.1–0.9)
MONO%: 14 % (ref 0.0–14.0)
NEUT%: 67.7 % (ref 38.4–76.8)
NEUTROS ABS: 2.2 10*3/uL (ref 1.5–6.5)
PLATELETS: 126 10*3/uL — AB (ref 145–400)
RBC: 3.74 10*6/uL (ref 3.70–5.45)
RDW: 19 % — ABNORMAL HIGH (ref 11.2–14.5)
WBC: 3.3 10*3/uL — ABNORMAL LOW (ref 3.9–10.3)

## 2017-04-04 LAB — COMPREHENSIVE METABOLIC PANEL
ALK PHOS: 89 U/L (ref 40–150)
ALT: 24 U/L (ref 0–55)
ANION GAP: 8 meq/L (ref 3–11)
AST: 21 U/L (ref 5–34)
Albumin: 3.3 g/dL — ABNORMAL LOW (ref 3.5–5.0)
BILIRUBIN TOTAL: 0.51 mg/dL (ref 0.20–1.20)
BUN: 18.6 mg/dL (ref 7.0–26.0)
CALCIUM: 9.1 mg/dL (ref 8.4–10.4)
CO2: 25 mEq/L (ref 22–29)
CREATININE: 1 mg/dL (ref 0.6–1.1)
Chloride: 108 mEq/L (ref 98–109)
EGFR: 56 mL/min/{1.73_m2} — ABNORMAL LOW (ref >90)
GLUCOSE: 142 mg/dL — AB (ref 70–140)
POTASSIUM: 3.8 meq/L (ref 3.5–5.1)
Sodium: 141 mEq/L (ref 136–145)
TOTAL PROTEIN: 6.2 g/dL — AB (ref 6.4–8.3)

## 2017-04-04 LAB — URIC ACID: Uric Acid, Serum: 4.4 mg/dl (ref 2.6–7.4)

## 2017-04-04 LAB — MAGNESIUM: MAGNESIUM: 1.6 mg/dL (ref 1.5–2.5)

## 2017-04-04 MED ORDER — DIPHENHYDRAMINE HCL 25 MG PO CAPS
25.0000 mg | ORAL_CAPSULE | Freq: Once | ORAL | Status: AC
  Administered 2017-04-04: 25 mg via ORAL

## 2017-04-04 MED ORDER — SODIUM CHLORIDE 0.9 % IV SOLN
500.0000 mg | Freq: Once | INTRAVENOUS | Status: AC
  Administered 2017-04-04: 500 mg via INTRAVENOUS
  Filled 2017-04-04: qty 1

## 2017-04-04 MED ORDER — SODIUM CHLORIDE 0.9% FLUSH
10.0000 mL | INTRAVENOUS | Status: DC | PRN
  Administered 2017-04-04: 10 mL
  Filled 2017-04-04: qty 10

## 2017-04-04 MED ORDER — PALONOSETRON HCL INJECTION 0.25 MG/5ML
0.2500 mg | Freq: Once | INTRAVENOUS | Status: AC
  Administered 2017-04-04: 0.25 mg via INTRAVENOUS

## 2017-04-04 MED ORDER — HEPARIN SOD (PORK) LOCK FLUSH 100 UNIT/ML IV SOLN
500.0000 [IU] | Freq: Once | INTRAVENOUS | Status: AC | PRN
  Administered 2017-04-04: 500 [IU]
  Filled 2017-04-04: qty 5

## 2017-04-04 MED ORDER — RITUXIMAB CHEMO INJECTION 500 MG/50ML
375.0000 mg/m2 | Freq: Once | INTRAVENOUS | Status: AC
  Administered 2017-04-04: 800 mg via INTRAVENOUS
  Filled 2017-04-04: qty 50

## 2017-04-04 MED ORDER — ACETAMINOPHEN 325 MG PO TABS
650.0000 mg | ORAL_TABLET | Freq: Once | ORAL | Status: AC
  Administered 2017-04-04: 650 mg via ORAL

## 2017-04-04 MED ORDER — MORPHINE SULFATE ER 15 MG PO TBCR: 15.0000 mg | EXTENDED_RELEASE_TABLET | Freq: Two times a day (BID) | ORAL | 0 refills | Status: AC

## 2017-04-04 MED ORDER — PALONOSETRON HCL INJECTION 0.25 MG/5ML
INTRAVENOUS | Status: AC
  Filled 2017-04-04: qty 5

## 2017-04-04 MED ORDER — SODIUM CHLORIDE 0.9 % IV SOLN
90.0000 mg/m2 | Freq: Once | INTRAVENOUS | Status: AC
  Administered 2017-04-04: 200 mg via INTRAVENOUS
  Filled 2017-04-04: qty 8

## 2017-04-04 MED ORDER — SODIUM CHLORIDE 0.9 % IV SOLN
Freq: Once | INTRAVENOUS | Status: AC
  Administered 2017-04-04: 12:00:00 via INTRAVENOUS

## 2017-04-04 MED ORDER — ONDANSETRON 8 MG PO TBDP: 8.0000 mg | ORAL_TABLET | Freq: Three times a day (TID) | ORAL | 0 refills | Status: AC | PRN

## 2017-04-04 MED ORDER — MORPHINE SULFATE 15 MG PO TABS: 15.0000 mg | ORAL_TABLET | ORAL | 0 refills | Status: AC | PRN

## 2017-04-04 MED ORDER — ACETAMINOPHEN 325 MG PO TABS
ORAL_TABLET | ORAL | Status: AC
  Filled 2017-04-04: qty 2

## 2017-04-04 MED ORDER — DEXAMETHASONE SODIUM PHOSPHATE 10 MG/ML IJ SOLN
INTRAMUSCULAR | Status: AC
  Filled 2017-04-04: qty 1

## 2017-04-04 MED ORDER — DEXAMETHASONE SODIUM PHOSPHATE 10 MG/ML IJ SOLN
10.0000 mg | Freq: Once | INTRAMUSCULAR | Status: AC
  Administered 2017-04-04: 10 mg via INTRAVENOUS

## 2017-04-04 MED ORDER — DIPHENHYDRAMINE HCL 25 MG PO CAPS
ORAL_CAPSULE | ORAL | Status: AC
  Filled 2017-04-04: qty 1

## 2017-04-04 MED ORDER — PROCHLORPERAZINE MALEATE 10 MG PO TABS: 10.0000 mg | ORAL_TABLET | Freq: Four times a day (QID) | ORAL | 0 refills | Status: AC | PRN

## 2017-04-04 NOTE — Patient Instructions (Signed)
[facility] Discharge Instructions for Patients Receiving Chemotherapy  Today you received the following chemotherapy agents Rituxan/Bendeka To help prevent nausea and vomiting after your treatment, we encourage you to take your nausea medication as prescribed.   If you develop nausea and vomiting that is not controlled by your nausea medication, call the clinic.   BELOW ARE SYMPTOMS THAT SHOULD BE REPORTED IMMEDIATELY:  *FEVER GREATER THAN 100.5 F  *CHILLS WITH OR WITHOUT FEVER  NAUSEA AND VOMITING THAT IS NOT CONTROLLED WITH YOUR NAUSEA MEDICATION  *UNUSUAL SHORTNESS OF BREATH  *UNUSUAL BRUISING OR BLEEDING  TENDERNESS IN MOUTH AND THROAT WITH OR WITHOUT PRESENCE OF ULCERS  *URINARY PROBLEMS  *BOWEL PROBLEMS  UNUSUAL RASH Items with * indicate a potential emergency and should be followed up as soon as possible.  Feel free to call the clinic you have any questions or concerns. The clinic phone number is [phone].  Please show the CHEMO ALERT CARD at check-in to the Emergency Department and triage nurse.   

## 2017-04-04 NOTE — Telephone Encounter (Signed)
Gave avs and calendar for October  °

## 2017-04-10 NOTE — Assessment & Plan Note (Signed)
71 y.o. 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 Lymphoma was diagnosed in Dec 2015 and treated with dose-adjusted R-EPOCH with the patient receiving full 6 cycles of chemotherapy with 4 cycles of prophylactic intrathecal treatment. Treatment was conducted by [facility] system with complete response documented by imaging. Earlier this year, patient has developed symptomatic, biopsy-confirmed recurrence of the malignancy consistent with diffuse large B-cell lymphoma.  After extensive discussion of possible options, patient initiated palliative systemic chemoimmunotherapy with rituximab plus bendamustine receiving the initial dose of chemotherapy. Initial cycle was complicated by recurrent nausea, vomiting, and persistent abdominal discomfort. It was also complicated by protracted thrombocytopenia which required a delay of initiation of the second cycle of the treatment. As he and vomiting as well as patient's energy are improved significantly with initiation of scheduled IV fluid supplementation on Mondays and Thursdays. At the present time, reviewed labs and clinical evaluation or permissive to proceed with the second cycle of systemic chemotherapy. Based on protracted cytopenia following the last cycle, bendamustine dose will be reduced by 50% by omitting the second day of treatment altogether.  Plan: --Continue IV Fluids Q Mon/Thu; Continue PO magnesium supplementation for hypomagnesemia --Proceed with Cycle #2 of R-Benda: Day 1 therapy only --PET-CT prior to RTC for disease response assessment

## 2017-04-10 NOTE — Progress Notes (Signed)
[facility] Follow-up Visit:  Assessment: NHL (non-Hodgkin's lymphoma) ([place]) 71 y.o. 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 Lymphoma was diagnosed in Dec 2015 and treated with dose-adjusted R-EPOCH with the patient receiving full 6 cycles of chemotherapy with 4 cycles of prophylactic intrathecal treatment. Treatment was conducted by [facility] system with complete response documented by imaging. Earlier this year, patient has developed symptomatic, biopsy-confirmed recurrence of the malignancy consistent with diffuse large B-cell lymphoma.  After extensive discussion of possible options, patient initiated palliative systemic chemoimmunotherapy with rituximab plus bendamustine receiving the initial dose of chemotherapy. Initial cycle was complicated by recurrent nausea, vomiting, and persistent abdominal discomfort. It was also complicated by protracted thrombocytopenia which required a delay of initiation of the second cycle of the treatment. As he and vomiting as well as patient's energy are improved significantly with initiation of scheduled IV fluid supplementation on Mondays and Thursdays. At the present time, reviewed labs and clinical evaluation or permissive to proceed with the second cycle of systemic chemotherapy. Based on protracted cytopenia following the last cycle, bendamustine dose will be reduced by 50% by omitting the second day of treatment altogether.  Plan: --Continue IV Fluids Q Mon/Thu; Continue PO magnesium supplementation for hypomagnesemia --Proceed with Cycle #2 of R-Benda: Day 1 therapy only --PET-CT prior to RTC for disease response assessment   Return to clinic in 4 weeks for possible third cycle of systemic chemoimmunotherapy.  Voice recognition software was used and creation of this note. Despite my best effort at editing the text, some  misspelling/errors may have occurred.   Orders Placed This Encounter  Procedures  . NM PET Image Restag (PS) Skull Base To Thigh    Standing Status:   Future    Standing Expiration Date:   04/04/2018    Order Specific Question:   If indicated for the ordered procedure, I authorize the administration of a radiopharmaceutical per Radiology protocol    Answer:   Yes    Order Specific Question:   Preferred imaging location?    Answer:   [facility]    Order Specific Question:   Radiology Contrast Protocol - do NOT remove file path    Answer:   [email or web address]    Order Specific Question:   Reason for Exam additional comments    Answer:   DLBCL s/p 2 cycles of therapy, please eval response  . CBC with Differential    Standing Status:   Future    Standing Expiration Date:   04/04/2018  . Comprehensive metabolic panel    Standing Status:   Future    Standing Expiration Date:   04/04/2018  . Lactate dehydrogenase (LDH)    Standing Status:   Future    Standing Expiration Date:   04/04/2018  . Uric acid    Standing Status:   Future    Standing Expiration Date:   04/04/2018  . Magnesium    Standing Status:   Future    Standing Expiration Date:   04/04/2018  . Phosphorus    Standing Status:   Future    Standing Expiration Date:   04/04/2018  . Beta 2 microglobulin    Standing Status:   Future    Standing Expiration Date:   04/04/2018    Cancer Staging No matching staging information was found for the patient.  All questions were answered.  . The patient knows to call the  clinic with any problems, questions or concerns.  This note was electronically signed.    History of Presenting Illness Erica Woodward is a 71 y.o. female followed in the [place] for monitoring and treatment of recurrent diffuse large B-cell lymphomaIn setting of renal transplant. Patient is currently undergoing palliative systemic chemoimmunotherapy with rituximab and  bendamustine. She presents to the clinic today to receive the second cycle of therapy.  Treatment initiation has been delayed due to persistent thrombocytopenia from the last cycle. In the interim, patient reports feeling significantly better. No additional visits to the emergency room. Patient was able to regain some weight that she lost previously. Nausea is better controlled. At the present time, patient feels reasonably well, but continues to have weakness. Abdominal pain appears to have resolved at this time.  Oncological/hematological History:   NHL (non-Hodgkin's lymphoma) ([place])   06/25/2014 Initial Diagnosis    Diffuse large B-cell lymphoma (followed and treated by Hessie Dibble, MD & Lovenia Shuck Despina Hidden, PA-C at [facility]); As per the last note: "She [patient] presented to an outside hospital in December 2015 with worsening nausea, vomiting, and left lower quadrant abdominal pain. CT abdomen/ pelvis was concerning for peritoneal carcinomatosis. She was transferred to [facility] service for further management. A CT abdomen/ pelvis with contrast on 06/17/14 showed diffuse peritoneal metastatic disease, ascites, bilateral pleural effusions, and focal thickening of a loop of small bowel.   A paracentesis performed on 12/7 came back showing a high-grade malignant neoplasm favoring lymphoma. She underwent an open exploratory laparotomy and biopsy of the thickened omentum on 12/11. Pathology showed aggressive diffuse large B-cell lymphoma with Ki-67 of 100% that was strongly positive for CD20, CD10, and BCL-6 and negative for BCL-2. FISH studies were negative for MYC, BCL6, and IgH/BCL2 translocations. EBV PCR and HIV were negative. Bone marrow biopsy showed a mild CD20+ interstitial B-cell lymphocytosis. Labs showed an elevated LDH of 326 and anemia (Hgb 10.5). She did not have a PET CT scan at diagnosis as chemotherapy was immediately started as an inpatient. CSF showed  atypical lymphocytes favoring benign reactive pleocytosis but flow cytometry was not performed. She reported a 40 lb weight loss as well as progressive night sweats over 3 months. Thus, she had stage IVB disease at presentation with IPI 5.      07/02/2014 - 11/01/2014 Chemotherapy    Dose Adjusted R-EPOCH: --Cycle #1, 07/02/14: Dose Level 1 --Cycle #2, 07/26/14: Dose Level 2 --Cycle #3, 08/16/14: Dose Level 2 --Cycle #4, 09/06/14: Dose Level 2 --Cycle #5, 09/27/14: Dose Level 2 --Cycle #6, 11/01/14: Dose Level 1 -- Delayed by 1 week due to poor tolerance to treatment and decline in her performance status. Dose level reduced to 1.  Completed IT chemotherapy x 4 doses.          09/20/2014 Imaging    External PET-CT (after 4 cycles of systemic therapy): No evidence of FDG avid recurrent or metastatic disease. Interval resolution of diffuse mesenteric edema and abdominopelvic ascites. Interval resolution of small bilateral pleural effusions. Diffuse metabolic activity in the bone marrow consistent with chemotherapy treatment. Right adnexal cyst measuring 3.3 cm. Recommend annual ultrasound surveillance for this lesion.        12/29/2016 Relapse/Recurrence    Patient presented with recurrent abdominal pain, which led to CT of the abdomen with contrast and pain medications. Imaging revealed recurrent lymphadenopathy in the abdomen strongly suspicious for possible recurrent lymphoma. Presentation was complicated by development of delirium likely due to  combination of severe pain, opioid pain medications, acute kidney injury precipitated by hyperuricemia and possibly contrast administration in a dehydrated patient.  Patient required hospitalization, stabilized and improved significantly. Nevertheless, continues to have multiple problems not the least of which is persistent peripheral swelling, worsening performance status, dyspnea on exertion.      01/25/2017 Imaging    PET-CT: Intensely  hypermetabolic retroperitoneal, pelvic, sigmoid mesocolon, and perirectal adenopathy compatible with malignancy. There is also a small but hypermetabolic paraesophageal lymph node in the thorax. Appearance compatible with active lymphoma.      01/25/2017 Imaging    MRI L-spine: Degenerative lumbar spondylosis with multilevel disc disease and facet disease. The most significant findings are at L4-5 where there is severe multifactorial spinal and bilateral lateral recess stenosis and significant right foraminal stenosis.      02/01/2017 Pathology Results    LN Core Bx: High-grade large cell lymphoma; IHC -- positive for LCA, CD79.9, CD20, CD10, BCL2, BCL6 & negative for CD30, CD34, CD138, CD3; BM Bx: No evidence of involvement by a monoclonal lymphoproliferative process      02/22/2017 -  Chemotherapy    Rituximab 333m/m2, d1 + Bendamustine 965mm2, d1,2 Q28 D --Cycle #1, 02/22/17: --Cycle #2, 04/04/17: delayed due to thrombocytopenia        Medical History: Past Medical History:  Diagnosis Date  . [facility]al cell carcinoma   [facility]   [facility]  . Chronic headaches   . DDD (degenerative disc disease), lumbar    L4-5 right foraminal and spinal stenosis causng lumbar radiculopathy  . Diverticulosis   . GERD (gastroesophageal reflux disease)   . HLD (hyperlipidemia)   . Hx of adenomatous polyp of colon 05/21/2016  . Hypertension   . Renal disorder    kidney transplant 2005  . Status post dilation of esophageal narrowing     Surgical History: Past Surgical History:  Procedure Laterality Date  . COLONOSCOPY    . IR FLUORO GUIDE PORT INSERTION LEFT  02/18/2017  . IR [place] VASC ACCESS LEFT  02/18/2017  . KIDNEY TRANSPLANT  07/26/2013  . LUMBAR PUNCTURE W/ INTRATHECAL CHEMOTHERAPY  08/19/14  . PORTACATH PLACEMENT    . UPPER GASTROINTESTINAL ENDOSCOPY      Family History: Family History  Problem Relation Age of Onset  . Kidney disease Father   . Diabetes Father   .  Lung cancer Brother   . Seizures Brother   . Seizures Sister   . Breast cancer Daughter   . Heart disease Brother     Social History: Social History   Social History  . Marital status: Married    Spouse name: N/A  . Number of children: 4  . Years of education: N/A   Occupational History  . retired    Social History Main Topics  . Smoking status: Never Smoker  . Smokeless tobacco: Never Used  . Alcohol use No  . Drug use: No  . Sexual activity: Not on file   Other Topics Concern  . Not on file   Social History Narrative  . No narrative on file    Allergies: No Known Allergies  Medications:  Current Outpatient Prescriptions  Medication Sig Dispense Refill  . acyclovir (ZOVIRAX) 400 MG tablet Take 1 tablet (400 mg total) by mouth daily. 30 tablet 3  . allopurinol (ZYLOPRIM) 300 MG tablet Take 1 tablet (300 mg total) by mouth daily. 30 tablet 3  . aspirin EC 81 MG tablet Take 81 mg by mouth daily.    .Marland Kitchen  atorvastatin (LIPITOR) 40 MG tablet TAKE 1 TABLET (40 MG TOTAL) BY MOUTH DAILY. 90 tablet 1  . cetirizine (ZYRTEC) 10 MG tablet Take 10 mg by mouth at bedtime.    Marland Kitchen dexamethasone (DECADRON) 4 MG tablet Take 2 tablets (8 mg total) by mouth daily. Start the day after bendamustine chemotherapy for 2 days. Take with food. 30 tablet 1  . esomeprazole (NEXIUM) 40 MG capsule Take 1 capsule (40 mg total) by mouth daily. 90 capsule 3  . gabapentin (NEURONTIN) 300 MG capsule Take 1 capsule (300 mg total) by mouth 3 (three) times daily. (Patient taking differently: Take 900 mg by mouth 2 (two) times daily. ) 270 capsule 3  . lidocaine-prilocaine (EMLA) cream Apply to affected area once 30 g 3  . LORazepam (ATIVAN) 0.5 MG tablet Take 1 tablet (0.5 mg total) by mouth every 6 (six) hours as needed (Nausea or vomiting). 30 tablet 0  . magnesium oxide (MAG-OX) 400 MG tablet Take 1 tablet (400 mg total) by mouth 2 (two) times daily. 60 tablet 3  . morphine (MSIR) 15 MG tablet Take 1 tablet  (15 mg total) by mouth every 2 (two) hours as needed for severe pain (cancer-associated pain). 50 tablet 0  . ondansetron (ZOFRAN ODT) 8 MG disintegrating tablet Take 1 tablet (8 mg total) by mouth every 8 (eight) hours as needed for nausea or vomiting. 30 tablet 0  . ondansetron (ZOFRAN) 8 MG tablet Take 1 tablet (8 mg total) by mouth 2 (two) times daily as needed for refractory nausea / vomiting. Start on day 2 after bendamustine chemotherapy. 30 tablet 1  . predniSONE (DELTASONE) 10 MG tablet Take 10 mg by mouth daily with breakfast.    . predniSONE (DELTASONE) 20 MG tablet 3 tabs poqday 1-2, 2 tabs poqday 3-4, 1 tab poqday 5-6 12 tablet 0  . tacrolimus (PROGRAF) 0.5 MG capsule Take 1.5 mg by mouth 2 (two) times daily. Take 3 capsules (1.5 mg) BID    . morphine (MS CONTIN) 15 MG 12 hr tablet Take 1 tablet (15 mg total) by mouth every 12 (twelve) hours. 60 tablet 0  . prochlorperazine (COMPAZINE) 10 MG tablet Take 1 tablet (10 mg total) by mouth every 6 (six) hours as needed (Nausea or vomiting). 30 tablet 0   No current facility-administered medications for this visit.     Review of Systems: Review of Systems  All other systems reviewed and are negative.    PHYSICAL EXAMINATION Blood pressure 121/68, pulse (!) 111, temperature 97.8 F (36.6 C), temperature source Oral, resp. rate 18, height '5\' 4"'  (1.626 m), weight 222 lb 1.6 oz (100.7 kg), SpO2 98 %.  ECOG PERFORMANCE STATUS: 2 - Symptomatic, <50% confined to bed  Physical Exam  Constitutional: She is oriented to person, place, and time.  Patient appears fatigued.  HENT:  Head: Normocephalic and atraumatic.  Mouth/Throat: Oropharynx is clear and moist. No oropharyngeal exudate.  No oral thrush. No mucosal ulcers  Eyes: Pupils are equal, round, and reactive to light. Conjunctivae are normal. No scleral icterus.  Neck: No tracheal deviation present. No thyromegaly present.  Cardiovascular: Normal rate and regular rhythm.   No  murmur heard. Distant heart sounds  Pulmonary/Chest: Effort normal. No stridor. She has no wheezes.  Upper lungs are clear to auscultation. Lower lungs with bilateral dependent crackles and possible bones to percussion at the bases of the lungs.  Abdominal: Soft. She exhibits no distension and no mass. There is tenderness. There is no rebound  and no guarding.  If no palpable tenderness in the left upper quadrant of the abdomen without rebound or peritoneal irritation. No palpable mass.  Musculoskeletal:  Tenderness to palpation over the lower lumbar spine and sacrum. No radiation down in the extremities.  Lymphadenopathy:       Head (right side): No submandibular and no occipital adenopathy present.       Head (left side): No submandibular and no occipital adenopathy present.    She has no cervical adenopathy.    She has no axillary adenopathy.       Right: No inguinal and no supraclavicular adenopathy present.       Left: No inguinal and no supraclavicular adenopathy present.  Neurological: She is oriented to person, place, and time. She has normal sensation, normal reflexes and intact cranial nerves.  Skin: She is not diaphoretic.  Nursing note and vitals reviewed.    LABORATORY DATA: I have personally reviewed the data as listed: Appointment on 04/04/2017  Component Date Value Ref Range Status  . WBC 04/04/2017 3.3* 3.9 - 10.3 10e3/uL Final  . NEUT# 04/04/2017 2.2  1.5 - 6.5 10e3/uL Final  . HGB 04/04/2017 12.1  11.6 - 15.9 g/dL Final  . HCT 04/04/2017 36.7  34.8 - 46.6 % Final  . Platelets 04/04/2017 126* 145 - 400 10e3/uL Final  . MCV 04/04/2017 98.1  79.5 - 101.0 fL Final  . MCH 04/04/2017 32.4  25.1 - 34.0 pg Final  . MCHC 04/04/2017 33.0  31.5 - 36.0 g/dL Final  . RBC 04/04/2017 3.74  3.70 - 5.45 10e6/uL Final  . RDW 04/04/2017 19.0* 11.2 - 14.5 % Final  . lymph# 04/04/2017 0.5* 0.9 - 3.3 10e3/uL Final  . MONO# 04/04/2017 0.5  0.1 - 0.9 10e3/uL Final  . Eosinophils  Absolute 04/04/2017 0.1  0.0 - 0.5 10e3/uL Final  . Basophils Absolute 04/04/2017 0.0  0.0 - 0.1 10e3/uL Final  . NEUT% 04/04/2017 67.7  38.4 - 76.8 % Final  . LYMPH% 04/04/2017 15.6  14.0 - 49.7 % Final  . MONO% 04/04/2017 14.0  0.0 - 14.0 % Final  . EOS% 04/04/2017 2.2  0.0 - 7.0 % Final  . BASO% 04/04/2017 0.5  0.0 - 2.0 % Final  . Sodium 04/04/2017 141  136 - 145 mEq/L Final  . Potassium 04/04/2017 3.8  3.5 - 5.1 mEq/L Final  . Chloride 04/04/2017 108  98 - 109 mEq/L Final  . CO2 04/04/2017 25  22 - 29 mEq/L Final  . Glucose 04/04/2017 142* 70 - 140 mg/dl Final   Glucose reference range is for nonfasting patients. Fasting glucose reference range is 70- 100.  Marland Kitchen BUN 04/04/2017 18.6  7.0 - 26.0 mg/dL Final  . Creatinine 04/04/2017 1.0  0.6 - 1.1 mg/dL Final  . Total Bilirubin 04/04/2017 0.51  0.20 - 1.20 mg/dL Final  . Alkaline Phosphatase 04/04/2017 89  40 - 150 U/L Final  . AST 04/04/2017 21  5 - 34 U/L Final  . ALT 04/04/2017 24  0 - 55 U/L Final  . Total Protein 04/04/2017 6.2* 6.4 - 8.3 g/dL Final  . Albumin 04/04/2017 3.3* 3.5 - 5.0 g/dL Final  . Calcium 04/04/2017 9.1  8.4 - 10.4 mg/dL Final  . Anion Gap 04/04/2017 8  3 - 11 mEq/L Final  . EGFR 04/04/2017 56* >90 ml/min/1.73 m2 Final   eGFR is calculated using the CKD-EPI Creatinine Equation (2009)  . Magnesium 04/04/2017 1.6  1.5 - 2.5 mg/dl Final  . Uric  Acid, Serum 04/04/2017 4.4  2.6 - 7.4 mg/dl Final       Ardath Sax, MD

## 2017-04-23 ENCOUNTER — Other Ambulatory Visit: Payer: Self-pay | Admitting: Family Medicine

## 2017-04-23 DIAGNOSIS — C8580 Other specified types of non-Hodgkin lymphoma, unspecified site: Secondary | ICD-10-CM | POA: Diagnosis not present

## 2017-04-23 DIAGNOSIS — Z6841 Body Mass Index (BMI) 40.0 and over, adult: Secondary | ICD-10-CM | POA: Diagnosis not present

## 2017-04-23 DIAGNOSIS — Z95828 Presence of other vascular implants and grafts: Secondary | ICD-10-CM | POA: Diagnosis not present

## 2017-04-23 DIAGNOSIS — M0230 Reiter's disease, unspecified site: Secondary | ICD-10-CM | POA: Diagnosis not present

## 2017-04-23 DIAGNOSIS — B370 Candidal stomatitis: Secondary | ICD-10-CM | POA: Diagnosis not present

## 2017-04-23 DIAGNOSIS — Z23 Encounter for immunization: Secondary | ICD-10-CM | POA: Diagnosis not present

## 2017-04-23 DIAGNOSIS — R0609 Other forms of dyspnea: Secondary | ICD-10-CM | POA: Diagnosis not present

## 2017-04-23 DIAGNOSIS — Z940 Kidney transplant status: Secondary | ICD-10-CM | POA: Diagnosis not present

## 2017-05-01 ENCOUNTER — Ambulatory Visit (HOSPITAL_COMMUNITY): Admission: RE | Admit: 2017-05-01 | Payer: Medicare HMO | Source: Ambulatory Visit

## 2017-05-02 ENCOUNTER — Ambulatory Visit: Payer: Self-pay | Admitting: Hematology and Oncology

## 2017-05-02 ENCOUNTER — Ambulatory Visit: Payer: Self-pay

## 2017-05-02 ENCOUNTER — Other Ambulatory Visit: Payer: Self-pay

## 2017-05-16 DIAGNOSIS — 419620001 Death: Secondary | SNOMED CT | POA: Diagnosis not present

## 2017-05-16 DEATH — deceased

## 2017-06-21 ENCOUNTER — Other Ambulatory Visit: Payer: Self-pay | Admitting: Nurse Practitioner

## 2018-11-07 IMAGING — PT NM PET TUM IMG INITIAL (PI) SKULL BASE T - THIGH
1 of 8 series · 1 of 25 positions shown · non-contrast
Comparison: Multiple exams, including CT abdomen from 12/30/2016

CLINICAL DATA: Subsequent treatment strategy for B-cell lymphoma,
with retroperitoneal adenopathy..

EXAM:
NUCLEAR MEDICINE PET SKULL BASE TO THIGH
TECHNIQUE: 11.2 mCi F-18 FDG was injected intravenously. Full-ring PET imaging
was performed from the skull base to thigh after the radiotracer. CT
data was obtained and used for attenuation correction and anatomic
localization.
FASTING BLOOD GLUCOSE:  Value: 104 mg/dl

[Series 4: ct sk_thigh 5.0 b31f · axial · 5.0mm · 0.98mm/px · 1 of 225 slices shown]
[im 225/225  brain]
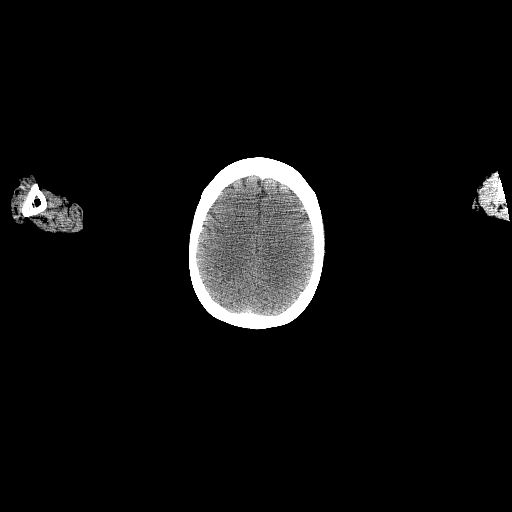

[1 of 25 positions shown; findings below may reference images not displayed]

FINDINGS: NECK

No hypermetabolic lymph nodes in the neck.

CHEST

A 4 mm paraesophageal lymph node on image 71/4 has a maximum
standard uptake value of 12.3, favoring malignant involvement.

Small type 1 hiatal hernia. Calcified granuloma posteriorly in the
left upper lobe on image [DATE].

ABDOMEN/PELVIS

Extensive hypermetabolic retrocrural, periaortic/retroperitoneal,
bilateral common iliac, left external iliac, sigmoid mesocolon, and
perirectal space hypermetabolic adenopathy. One of the larger nodes
is in the left periaortic chain and measures 2.0 cm in short axis on
image 133/4, with maximum standard uptake value 55.3.

Even the smaller lymph nodes, such as the perirectal space nodule
measuring 0.7 by 0.9 cm on image 176/4, are highly hypermetabolic,
maximum SUV 18.9.

Severely atrophic native kidneys. Right pelvic transplant kidney.
Photopenic cyst anteriorly in segment 2 of the liver. Photopenic
cystic right adnexal lesion, 2.9 by 2.3 cm.

Aortoiliac atherosclerotic vascular disease.

SKELETON

No focal hypermetabolic activity to suggest skeletal metastasis.

Anterolisthesis at L4-5.
IMPRESSION: 1. Intensely hypermetabolic retroperitoneal, pelvic, sigmoid
mesocolon, and perirectal adenopathy compatible with malignancy.
There is also a small but hypermetabolic paraesophageal lymph node
in the thorax. Appearance compatible with active lymphoma.
2. Other imaging findings of potential clinical significance: Aortic
Atherosclerosis ([id number]). Photopenic hepatic and right adnexal
cysts. Severely atrophic native kidneys with right pelvic transplant
kidney. Small type 1 hiatal hernia.
